# Patient Record
Sex: Female | Born: 1945
Health system: Southern US, Community
[De-identification: ages and names within clinical notes are randomized; demographics above are authoritative.]

## PROBLEM LIST (undated history)

## (undated) DIAGNOSIS — J189 Pneumonia, unspecified organism: Secondary | ICD-10-CM

## (undated) DIAGNOSIS — E785 Hyperlipidemia, unspecified: Secondary | ICD-10-CM

## (undated) DIAGNOSIS — E119 Type 2 diabetes mellitus without complications: Secondary | ICD-10-CM

## (undated) DIAGNOSIS — F329 Major depressive disorder, single episode, unspecified: Secondary | ICD-10-CM

## (undated) DIAGNOSIS — M1990 Unspecified osteoarthritis, unspecified site: Secondary | ICD-10-CM

## (undated) DIAGNOSIS — K5720 Diverticulitis of large intestine with perforation and abscess without bleeding: Secondary | ICD-10-CM

## (undated) DIAGNOSIS — Z87442 Personal history of urinary calculi: Secondary | ICD-10-CM

## (undated) DIAGNOSIS — F411 Generalized anxiety disorder: Secondary | ICD-10-CM

## (undated) DIAGNOSIS — J449 Chronic obstructive pulmonary disease, unspecified: Secondary | ICD-10-CM

## (undated) DIAGNOSIS — F419 Anxiety disorder, unspecified: Secondary | ICD-10-CM

## (undated) DIAGNOSIS — N209 Urinary calculus, unspecified: Secondary | ICD-10-CM

## (undated) DIAGNOSIS — F32A Depression, unspecified: Secondary | ICD-10-CM

## (undated) DIAGNOSIS — E7800 Pure hypercholesterolemia, unspecified: Secondary | ICD-10-CM

## (undated) DIAGNOSIS — I10 Essential (primary) hypertension: Secondary | ICD-10-CM

## (undated) HISTORY — PX: TUBAL LIGATION: SHX77

## (undated) HISTORY — PX: APPENDECTOMY: SHX54

## (undated) HISTORY — DX: Major depressive disorder, single episode, unspecified: F32.9

## (undated) HISTORY — DX: Type 2 diabetes mellitus without complications: E11.9

## (undated) HISTORY — DX: Generalized anxiety disorder: F41.1

## (undated) HISTORY — PX: BACK SURGERY: SHX140

## (undated) HISTORY — DX: Diverticulitis of large intestine with perforation and abscess without bleeding: K57.20

---

## 2007-03-24 ENCOUNTER — Emergency Department (HOSPITAL_COMMUNITY): Admission: EM | Admit: 2007-03-24 | Discharge: 2007-03-24 | Payer: Self-pay | Admitting: Emergency Medicine

## 2007-04-06 ENCOUNTER — Ambulatory Visit: Payer: Self-pay | Admitting: *Deleted

## 2007-04-19 ENCOUNTER — Ambulatory Visit (HOSPITAL_COMMUNITY): Admission: RE | Admit: 2007-04-19 | Discharge: 2007-04-19 | Payer: Self-pay | Admitting: Internal Medicine

## 2007-06-09 ENCOUNTER — Ambulatory Visit: Payer: Self-pay | Admitting: Family Medicine

## 2007-07-08 ENCOUNTER — Ambulatory Visit: Payer: Self-pay | Admitting: Internal Medicine

## 2007-07-08 LAB — CONVERTED CEMR LAB
AST: 15 units/L (ref 0–37)
Alkaline Phosphatase: 55 units/L (ref 39–117)
BUN: 9 mg/dL (ref 6–23)
Creatinine, Ser: 0.62 mg/dL (ref 0.40–1.20)
HDL: 67 mg/dL (ref >39)
LDL Cholesterol: 97 mg/dL (ref 0–99)
Total Bilirubin: 0.9 mg/dL (ref 0.3–1.2)
Total CHOL/HDL Ratio: 3.1

## 2007-08-05 ENCOUNTER — Encounter: Payer: Self-pay | Admitting: Internal Medicine

## 2007-08-05 ENCOUNTER — Ambulatory Visit: Payer: Self-pay | Admitting: Internal Medicine

## 2007-08-17 ENCOUNTER — Ambulatory Visit (HOSPITAL_COMMUNITY): Admission: RE | Admit: 2007-08-17 | Discharge: 2007-08-17 | Payer: Self-pay | Admitting: Family Medicine

## 2007-10-07 ENCOUNTER — Ambulatory Visit: Payer: Self-pay | Admitting: Internal Medicine

## 2007-12-07 ENCOUNTER — Encounter (INDEPENDENT_AMBULATORY_CARE_PROVIDER_SITE_OTHER): Payer: Self-pay | Admitting: Family Medicine

## 2007-12-07 ENCOUNTER — Ambulatory Visit: Payer: Self-pay | Admitting: Internal Medicine

## 2007-12-07 LAB — CONVERTED CEMR LAB
ALT: 22 units/L (ref 0–35)
Alkaline Phosphatase: 64 units/L (ref 39–117)
LDL Cholesterol: 59 mg/dL (ref 0–99)
Sodium: 140 meq/L (ref 135–145)
Total Bilirubin: 0.8 mg/dL (ref 0.3–1.2)
Total CHOL/HDL Ratio: 2.6
Total Protein: 7.7 g/dL (ref 6.0–8.3)
Triglycerides: 196 mg/dL — ABNORMAL HIGH (ref <150)
VLDL: 39 mg/dL (ref 0–40)

## 2008-01-17 ENCOUNTER — Ambulatory Visit: Payer: Self-pay | Admitting: Internal Medicine

## 2008-04-04 ENCOUNTER — Ambulatory Visit: Payer: Self-pay | Admitting: Internal Medicine

## 2008-08-16 ENCOUNTER — Ambulatory Visit (HOSPITAL_COMMUNITY): Admission: RE | Admit: 2008-08-16 | Discharge: 2008-08-16 | Payer: Self-pay | Admitting: Family Medicine

## 2008-08-20 ENCOUNTER — Encounter: Admission: RE | Admit: 2008-08-20 | Discharge: 2008-08-20 | Payer: Self-pay | Admitting: Family Medicine

## 2008-09-19 ENCOUNTER — Ambulatory Visit: Payer: Self-pay | Admitting: Internal Medicine

## 2008-10-31 ENCOUNTER — Ambulatory Visit: Payer: Self-pay | Admitting: Internal Medicine

## 2008-10-31 LAB — CONVERTED CEMR LAB
BUN: 10 mg/dL (ref 6–23)
Calcium: 10.1 mg/dL (ref 8.4–10.5)
Cholesterol: 175 mg/dL (ref 0–200)
Potassium: 4.5 meq/L (ref 3.5–5.3)
Sodium: 139 meq/L (ref 135–145)
Triglycerides: 183 mg/dL — ABNORMAL HIGH (ref <150)
VLDL: 37 mg/dL (ref 0–40)

## 2008-11-07 ENCOUNTER — Ambulatory Visit: Payer: Self-pay | Admitting: Internal Medicine

## 2009-01-12 ENCOUNTER — Emergency Department (HOSPITAL_COMMUNITY): Admission: EM | Admit: 2009-01-12 | Discharge: 2009-01-12 | Payer: Self-pay | Admitting: Emergency Medicine

## 2009-01-14 ENCOUNTER — Ambulatory Visit: Payer: Self-pay | Admitting: Internal Medicine

## 2009-01-30 ENCOUNTER — Ambulatory Visit: Payer: Self-pay | Admitting: Internal Medicine

## 2009-06-06 ENCOUNTER — Ambulatory Visit: Payer: Self-pay | Admitting: Internal Medicine

## 2009-08-22 ENCOUNTER — Ambulatory Visit (HOSPITAL_COMMUNITY): Admission: RE | Admit: 2009-08-22 | Discharge: 2009-08-22 | Payer: Self-pay | Admitting: Internal Medicine

## 2009-08-28 ENCOUNTER — Ambulatory Visit: Payer: Self-pay | Admitting: Internal Medicine

## 2009-09-18 ENCOUNTER — Ambulatory Visit: Payer: Self-pay | Admitting: Internal Medicine

## 2009-09-18 LAB — CONVERTED CEMR LAB
Albumin: 4.5 g/dL (ref 3.5–5.2)
Alkaline Phosphatase: 60 units/L (ref 39–117)
BUN: 11 mg/dL (ref 6–23)
CO2: 24 meq/L (ref 19–32)
Calcium: 9.9 mg/dL (ref 8.4–10.5)
Chloride: 104 meq/L (ref 96–112)
Cholesterol: 139 mg/dL (ref 0–200)
Glucose, Bld: 157 mg/dL — ABNORMAL HIGH (ref 70–99)
HDL: 59 mg/dL (ref >39)
Potassium: 4.2 meq/L (ref 3.5–5.3)
Total Protein: 7.3 g/dL (ref 6.0–8.3)
Triglycerides: 202 mg/dL — ABNORMAL HIGH (ref <150)

## 2009-11-07 ENCOUNTER — Ambulatory Visit: Payer: Self-pay | Admitting: Internal Medicine

## 2009-11-25 ENCOUNTER — Emergency Department (HOSPITAL_COMMUNITY): Admission: EM | Admit: 2009-11-25 | Discharge: 2009-11-25 | Payer: Self-pay | Admitting: Emergency Medicine

## 2010-02-05 ENCOUNTER — Ambulatory Visit: Payer: Self-pay | Admitting: Internal Medicine

## 2010-05-06 ENCOUNTER — Ambulatory Visit: Payer: Self-pay | Admitting: Internal Medicine

## 2010-09-12 ENCOUNTER — Encounter (INDEPENDENT_AMBULATORY_CARE_PROVIDER_SITE_OTHER): Payer: Self-pay | Admitting: Internal Medicine

## 2010-09-12 ENCOUNTER — Ambulatory Visit: Payer: Self-pay | Admitting: Internal Medicine

## 2010-09-12 LAB — CONVERTED CEMR LAB
BUN: 13 mg/dL (ref 6–23)
CO2: 26 meq/L (ref 19–32)
Calcium: 10 mg/dL (ref 8.4–10.5)
Chloride: 101 meq/L (ref 96–112)
Cholesterol: 151 mg/dL (ref 0–200)
Creatinine, Ser: 0.66 mg/dL (ref 0.40–1.20)
Glucose, Bld: 207 mg/dL — ABNORMAL HIGH (ref 70–99)
Total Bilirubin: 0.8 mg/dL (ref 0.3–1.2)
Total CHOL/HDL Ratio: 2.6
Triglycerides: 197 mg/dL — ABNORMAL HIGH (ref <150)
VLDL: 39 mg/dL (ref 0–40)

## 2010-09-29 ENCOUNTER — Ambulatory Visit (HOSPITAL_COMMUNITY): Admission: RE | Admit: 2010-09-29 | Discharge: 2010-09-29 | Payer: Self-pay | Admitting: Internal Medicine

## 2010-10-14 ENCOUNTER — Encounter (INDEPENDENT_AMBULATORY_CARE_PROVIDER_SITE_OTHER): Payer: Self-pay | Admitting: Internal Medicine

## 2010-10-14 LAB — CONVERTED CEMR LAB: Microalb, Ur: 0.5 mg/dL (ref 0.00–1.89)

## 2011-03-05 ENCOUNTER — Emergency Department (HOSPITAL_COMMUNITY): Payer: Medicare Other

## 2011-03-05 ENCOUNTER — Emergency Department (HOSPITAL_COMMUNITY)
Admission: EM | Admit: 2011-03-05 | Discharge: 2011-03-05 | Disposition: A | Discharge status: Home / Self Care | Payer: Medicare Other | Attending: Emergency Medicine | Admitting: Emergency Medicine

## 2011-03-05 DIAGNOSIS — R0682 Tachypnea, not elsewhere classified: Secondary | ICD-10-CM | POA: Insufficient documentation

## 2011-03-05 DIAGNOSIS — I10 Essential (primary) hypertension: Secondary | ICD-10-CM | POA: Insufficient documentation

## 2011-03-05 DIAGNOSIS — E7800 Pure hypercholesterolemia, unspecified: Secondary | ICD-10-CM | POA: Insufficient documentation

## 2011-03-05 DIAGNOSIS — J4489 Other specified chronic obstructive pulmonary disease: Secondary | ICD-10-CM | POA: Insufficient documentation

## 2011-03-05 DIAGNOSIS — R000 Tachycardia, unspecified: Secondary | ICD-10-CM | POA: Insufficient documentation

## 2011-03-05 DIAGNOSIS — R05 Cough: Secondary | ICD-10-CM | POA: Insufficient documentation

## 2011-03-05 DIAGNOSIS — M810 Age-related osteoporosis without current pathological fracture: Secondary | ICD-10-CM | POA: Insufficient documentation

## 2011-03-05 DIAGNOSIS — J449 Chronic obstructive pulmonary disease, unspecified: Secondary | ICD-10-CM | POA: Insufficient documentation

## 2011-03-05 DIAGNOSIS — R059 Cough, unspecified: Secondary | ICD-10-CM | POA: Insufficient documentation

## 2011-03-05 DIAGNOSIS — F411 Generalized anxiety disorder: Secondary | ICD-10-CM | POA: Insufficient documentation

## 2011-03-05 DIAGNOSIS — R079 Chest pain, unspecified: Secondary | ICD-10-CM | POA: Insufficient documentation

## 2011-03-05 DIAGNOSIS — R0602 Shortness of breath: Secondary | ICD-10-CM | POA: Insufficient documentation

## 2011-03-05 DIAGNOSIS — J440 Chronic obstructive pulmonary disease with acute lower respiratory infection: Secondary | ICD-10-CM | POA: Insufficient documentation

## 2011-03-05 DIAGNOSIS — J209 Acute bronchitis, unspecified: Secondary | ICD-10-CM | POA: Insufficient documentation

## 2011-03-07 ENCOUNTER — Emergency Department (HOSPITAL_COMMUNITY): Payer: Medicare Other

## 2011-03-07 ENCOUNTER — Emergency Department (HOSPITAL_COMMUNITY)
Admission: EM | Admit: 2011-03-07 | Discharge: 2011-03-07 | Disposition: A | Discharge status: Home / Self Care | Payer: Medicare Other | Attending: Emergency Medicine | Admitting: Emergency Medicine

## 2011-03-07 DIAGNOSIS — M810 Age-related osteoporosis without current pathological fracture: Secondary | ICD-10-CM | POA: Insufficient documentation

## 2011-03-07 DIAGNOSIS — R059 Cough, unspecified: Secondary | ICD-10-CM | POA: Insufficient documentation

## 2011-03-07 DIAGNOSIS — E7800 Pure hypercholesterolemia, unspecified: Secondary | ICD-10-CM | POA: Insufficient documentation

## 2011-03-07 DIAGNOSIS — R05 Cough: Secondary | ICD-10-CM | POA: Insufficient documentation

## 2011-03-07 DIAGNOSIS — R071 Chest pain on breathing: Secondary | ICD-10-CM | POA: Insufficient documentation

## 2011-03-07 DIAGNOSIS — J438 Other emphysema: Secondary | ICD-10-CM | POA: Insufficient documentation

## 2011-03-07 DIAGNOSIS — I10 Essential (primary) hypertension: Secondary | ICD-10-CM | POA: Insufficient documentation

## 2011-03-17 LAB — URINALYSIS, ROUTINE W REFLEX MICROSCOPIC
Bilirubin Urine: NEGATIVE
Glucose, UA: NEGATIVE mg/dL
Ketones, ur: NEGATIVE mg/dL
Nitrite: NEGATIVE
Specific Gravity, Urine: 1.011 (ref 1.005–1.030)
pH: 5.5 (ref 5.0–8.0)

## 2011-03-17 LAB — CBC
Hemoglobin: 13.6 g/dL (ref 12.0–15.0)
MCHC: 34.8 g/dL (ref 30.0–36.0)
MCV: 92 fL (ref 78.0–100.0)
RBC: 4.26 MIL/uL (ref 3.87–5.11)
WBC: 13.4 10*3/uL — ABNORMAL HIGH (ref 4.0–10.5)

## 2011-03-17 LAB — COMPREHENSIVE METABOLIC PANEL
ALT: 23 U/L (ref 0–35)
AST: 22 U/L (ref 0–37)
CO2: 24 mEq/L (ref 19–32)
Calcium: 8.8 mg/dL (ref 8.4–10.5)
Chloride: 103 mEq/L (ref 96–112)
GFR calc Af Amer: >60 mL/min (ref >60)
GFR calc non Af Amer: >60 mL/min (ref >60)
Glucose, Bld: 131 mg/dL — ABNORMAL HIGH (ref 70–99)
Sodium: 135 mEq/L (ref 135–145)
Total Bilirubin: 1.1 mg/dL (ref 0.3–1.2)

## 2011-03-17 LAB — DIFFERENTIAL
Basophils Absolute: 0 10*3/uL (ref 0.0–0.1)
Basophils Relative: 0 % (ref 0–1)
Eosinophils Absolute: 0.3 10*3/uL (ref 0.0–0.7)
Eosinophils Relative: 2 % (ref 0–5)
Neutrophils Relative %: 82 % — ABNORMAL HIGH (ref 43–77)

## 2011-03-17 LAB — LIPASE, BLOOD: Lipase: 47 U/L (ref 11–59)

## 2011-03-19 ENCOUNTER — Other Ambulatory Visit (HOSPITAL_COMMUNITY): Payer: Self-pay | Admitting: Family Medicine

## 2011-03-19 DIAGNOSIS — R9389 Abnormal findings on diagnostic imaging of other specified body structures: Secondary | ICD-10-CM

## 2011-03-20 ENCOUNTER — Ambulatory Visit (HOSPITAL_COMMUNITY): Payer: Medicare Other

## 2011-03-23 ENCOUNTER — Ambulatory Visit (HOSPITAL_COMMUNITY)
Admission: RE | Admit: 2011-03-23 | Discharge: 2011-03-23 | Disposition: A | Discharge status: Home / Self Care | Payer: Medicare Other | Source: Ambulatory Visit | Attending: Family Medicine | Admitting: Family Medicine

## 2011-03-23 DIAGNOSIS — R9389 Abnormal findings on diagnostic imaging of other specified body structures: Secondary | ICD-10-CM

## 2011-03-23 DIAGNOSIS — R911 Solitary pulmonary nodule: Secondary | ICD-10-CM | POA: Insufficient documentation

## 2011-03-23 DIAGNOSIS — S2239XA Fracture of one rib, unspecified side, initial encounter for closed fracture: Secondary | ICD-10-CM | POA: Insufficient documentation

## 2011-03-23 DIAGNOSIS — J449 Chronic obstructive pulmonary disease, unspecified: Secondary | ICD-10-CM | POA: Insufficient documentation

## 2011-03-23 DIAGNOSIS — J4489 Other specified chronic obstructive pulmonary disease: Secondary | ICD-10-CM | POA: Insufficient documentation

## 2011-03-23 MED ORDER — IOHEXOL 300 MG/ML  SOLN
100.0000 mL | Freq: Once | INTRAMUSCULAR | Status: AC | PRN
  Administered 2011-03-23: 85 mL via INTRAVENOUS

## 2011-06-26 ENCOUNTER — Emergency Department (HOSPITAL_COMMUNITY)
Admission: EM | Admit: 2011-06-26 | Discharge: 2011-06-27 | Disposition: A | Discharge status: Home / Self Care | Payer: Medicare Other | Attending: Emergency Medicine | Admitting: Emergency Medicine

## 2011-06-26 DIAGNOSIS — Z79899 Other long term (current) drug therapy: Secondary | ICD-10-CM | POA: Insufficient documentation

## 2011-06-26 DIAGNOSIS — K089 Disorder of teeth and supporting structures, unspecified: Secondary | ICD-10-CM | POA: Insufficient documentation

## 2011-06-26 DIAGNOSIS — K029 Dental caries, unspecified: Secondary | ICD-10-CM | POA: Insufficient documentation

## 2011-06-26 DIAGNOSIS — M810 Age-related osteoporosis without current pathological fracture: Secondary | ICD-10-CM | POA: Insufficient documentation

## 2011-06-26 DIAGNOSIS — I10 Essential (primary) hypertension: Secondary | ICD-10-CM | POA: Insufficient documentation

## 2011-06-26 DIAGNOSIS — E7800 Pure hypercholesterolemia, unspecified: Secondary | ICD-10-CM | POA: Insufficient documentation

## 2011-06-26 DIAGNOSIS — J438 Other emphysema: Secondary | ICD-10-CM | POA: Insufficient documentation

## 2011-07-21 ENCOUNTER — Encounter: Payer: Self-pay | Admitting: Emergency Medicine

## 2011-07-21 ENCOUNTER — Emergency Department (HOSPITAL_COMMUNITY)
Admission: EM | Admit: 2011-07-21 | Discharge: 2011-07-21 | Disposition: A | Discharge status: Home / Self Care | Payer: Medicare Other | Attending: Emergency Medicine | Admitting: Emergency Medicine

## 2011-07-21 ENCOUNTER — Emergency Department (HOSPITAL_BASED_OUTPATIENT_CLINIC_OR_DEPARTMENT_OTHER)
Admission: EM | Admit: 2011-07-21 | Discharge: 2011-07-22 | Disposition: A | Discharge status: Home / Self Care | Payer: Medicare Other | Attending: Emergency Medicine | Admitting: Emergency Medicine

## 2011-07-21 DIAGNOSIS — I10 Essential (primary) hypertension: Secondary | ICD-10-CM | POA: Insufficient documentation

## 2011-07-21 DIAGNOSIS — E785 Hyperlipidemia, unspecified: Secondary | ICD-10-CM | POA: Insufficient documentation

## 2011-07-21 DIAGNOSIS — J449 Chronic obstructive pulmonary disease, unspecified: Secondary | ICD-10-CM | POA: Insufficient documentation

## 2011-07-21 DIAGNOSIS — J4489 Other specified chronic obstructive pulmonary disease: Secondary | ICD-10-CM | POA: Insufficient documentation

## 2011-07-21 DIAGNOSIS — M25519 Pain in unspecified shoulder: Secondary | ICD-10-CM | POA: Insufficient documentation

## 2011-07-21 DIAGNOSIS — M79609 Pain in unspecified limb: Secondary | ICD-10-CM | POA: Insufficient documentation

## 2011-07-21 DIAGNOSIS — M810 Age-related osteoporosis without current pathological fracture: Secondary | ICD-10-CM | POA: Insufficient documentation

## 2011-07-21 HISTORY — DX: Chronic obstructive pulmonary disease, unspecified: J44.9

## 2011-07-21 HISTORY — DX: Hyperlipidemia, unspecified: E78.5

## 2011-07-21 HISTORY — DX: Essential (primary) hypertension: I10

## 2011-07-21 NOTE — ED Notes (Signed)
Pt c/o right shoulder pain with pain radiating down right arm. Pt reports similar episode 1 month ago with relief of pain after a week. Pt reports pain returned yesterday.

## 2011-07-22 ENCOUNTER — Emergency Department (INDEPENDENT_AMBULATORY_CARE_PROVIDER_SITE_OTHER): Payer: Medicare Other

## 2011-07-22 ENCOUNTER — Encounter (HOSPITAL_BASED_OUTPATIENT_CLINIC_OR_DEPARTMENT_OTHER): Payer: Self-pay | Admitting: Emergency Medicine

## 2011-07-22 ENCOUNTER — Other Ambulatory Visit: Payer: Self-pay

## 2011-07-22 DIAGNOSIS — Z8781 Personal history of (healed) traumatic fracture: Secondary | ICD-10-CM

## 2011-07-22 DIAGNOSIS — M25519 Pain in unspecified shoulder: Secondary | ICD-10-CM

## 2011-07-22 LAB — DIFFERENTIAL
Basophils Absolute: 0.1 10*3/uL (ref 0.0–0.1)
Basophils Relative: 1 % (ref 0–1)
Eosinophils Relative: 4 % (ref 0–5)
Lymphocytes Relative: 32 % (ref 12–46)
Monocytes Absolute: 0.9 10*3/uL (ref 0.1–1.0)
Neutro Abs: 5 10*3/uL (ref 1.7–7.7)

## 2011-07-22 LAB — BASIC METABOLIC PANEL
CO2: 21 mEq/L (ref 19–32)
Calcium: 9.8 mg/dL (ref 8.4–10.5)
Chloride: 103 mEq/L (ref 96–112)
Creatinine, Ser: <0.47 mg/dL — ABNORMAL LOW (ref 0.50–1.10)
Sodium: 137 mEq/L (ref 135–145)

## 2011-07-22 LAB — CBC
MCHC: 34.1 g/dL (ref 30.0–36.0)
Platelets: 244 10*3/uL (ref 150–400)
RDW: 13.3 % (ref 11.5–15.5)
WBC: 9.2 10*3/uL (ref 4.0–10.5)

## 2011-07-22 LAB — CARDIAC PANEL(CRET KIN+CKTOT+MB+TROPI)
CK, MB: 5.4 ng/mL — ABNORMAL HIGH (ref 0.3–4.0)
Relative Index: 2.1 (ref 0.0–2.5)
Total CK: 252 U/L — ABNORMAL HIGH (ref 7–177)
Total CK: 228 U/L — ABNORMAL HIGH (ref 7–177)

## 2011-07-22 MED ORDER — HYDROCODONE-ACETAMINOPHEN 5-500 MG PO TABS: 2.0000 | ORAL_TABLET | Freq: Four times a day (QID) | ORAL | Status: AC | PRN

## 2011-07-22 MED ORDER — KETOROLAC TROMETHAMINE 60 MG/2ML IM SOLN
60.0000 mg | Freq: Once | INTRAMUSCULAR | Status: AC
  Administered 2011-07-22: 60 mg via INTRAMUSCULAR
  Filled 2011-07-22: qty 2

## 2011-07-22 MED ORDER — NAPROXEN 375 MG PO TABS: 375.0000 mg | ORAL_TABLET | Freq: Two times a day (BID) | ORAL | Status: DC

## 2011-07-22 MED ORDER — OXYCODONE-ACETAMINOPHEN 5-325 MG PO TABS
ORAL_TABLET | ORAL | Status: AC
  Administered 2011-07-22: 1
  Filled 2011-07-22: qty 1

## 2011-07-22 NOTE — ED Notes (Signed)
MD at bedside. 

## 2011-07-22 NOTE — ED Provider Notes (Signed)
History     CSN: [id number] Arrival date & time: 07/21/2011 11:06 PM  Chief Complaint  Patient presents with  . Shoulder Pain   The history is provided by the patient and a relative. No language interpreter was used.  Patient denies trauma.  Pain is a 10/10 in the right shoulder, sharp and worse with movement and palpation.  No swelling of the upper extremity.  No rashes on the skin.  No CP, SOB, DOE, n/v/d.  No weakness.  No numbness.  Pain starts in the right shoulder and radiates to the elbow.  No fevers or chills.    Past Medical History  Diagnosis Date  . Hypertension   . Hyperlipemia   . Osteoporosis   . COPD (chronic obstructive pulmonary disease)     Past Surgical History  Procedure Date  . Back surgery   . Appendectomy   . Tubal ligation     History reviewed. No pertinent family history.  History  Substance Use Topics  . Smoking status: Former Games developer  . Smokeless tobacco: Not on file  . Alcohol Use: Yes    OB History    Grav Para Term Preterm Abortions TAB SAB Ect Mult Living                  Review of Systems  Constitutional: Negative for fever, chills, activity change, appetite change and fatigue.  HENT: Negative for facial swelling, neck pain and neck stiffness.   Eyes: Negative for discharge.  Respiratory: Negative for apnea.   Cardiovascular: Negative for chest pain.  Gastrointestinal: Negative for abdominal distention.  Genitourinary: Negative for difficulty urinating.  Musculoskeletal: Positive for arthralgias. Negative for back pain, joint swelling and gait problem.  Neurological: Negative for dizziness.  Hematological: Negative for adenopathy. Does not bruise/bleed easily.  Psychiatric/Behavioral: Negative for agitation.    Physical Exam  BP 187/73  Temp(Src) 98 F (36.7 C) (Oral)  Resp 18  SpO2 98%  Physical Exam  Constitutional: She is oriented to person, place, and time. She appears well-developed and well-nourished. No distress.    HENT:  Head: Normocephalic and atraumatic.  Eyes: EOM are normal. Pupils are equal, round, and reactive to light.  Neck: Neck supple. No JVD present.  Cardiovascular: Normal rate, regular rhythm and intact distal pulses.   Pulmonary/Chest: Effort normal and breath sounds normal. No respiratory distress.  Abdominal: Soft. Bowel sounds are normal. She exhibits no distension.  Musculoskeletal: She exhibits no edema.       Right shoulder: She exhibits pain. She exhibits normal range of motion, no bony tenderness, no swelling, no effusion, no crepitus, no deformity, no laceration, no spasm, normal pulse and normal strength.       5/5 strength of the RUE, no decreased sensation. Intact bicep, tricep reflex.  Negative Neeers tests of the right shoulder, no snuff box tenderness of the right wrist.  No effusion of the elbow.  No warmth erythema or induration of the skin.  No winging of the right scapula  Lymphadenopathy:    She has no cervical adenopathy.  Neurological: She is alert and oriented to person, place, and time. She displays normal reflexes.  Skin: Skin is warm and dry. No rash noted. No erythema. No pallor.  Psychiatric: She has a normal mood and affect.    ED Course  Procedures  MDM  Date: 07/22/2011  Rate:71  Rhythm: normal sinus rhythm  QRS Axis: normal  Intervals: normal  ST/T Wave abnormalities: normal  Conduction Disutrbances:none  Narrative  Interpretation: normal  Old EKG Reviewed: none available   Follow up with your family doctor for nerve conduction studies.  Return for CP, SOB, DOE or any concerns     Marinda Tyer K Farron Watrous-Rasch, MD 07/22/11 [phone]

## 2011-07-22 NOTE — ED Notes (Signed)
Pt complaining about waiting to see MD. Apologized for wait. Pt states she was told at [facility] she would not have to wait to be seen here. I apologized that she was given that information but informed pt that there is a wait to see MD.

## 2011-09-08 ENCOUNTER — Other Ambulatory Visit (HOSPITAL_COMMUNITY): Payer: Self-pay | Admitting: Family Medicine

## 2011-09-08 DIAGNOSIS — Z1231 Encounter for screening mammogram for malignant neoplasm of breast: Secondary | ICD-10-CM

## 2011-10-05 ENCOUNTER — Ambulatory Visit (HOSPITAL_COMMUNITY)
Admission: RE | Admit: 2011-10-05 | Discharge: 2011-10-05 | Disposition: A | Discharge status: Home / Self Care | Payer: Medicare Other | Source: Ambulatory Visit | Attending: Family Medicine | Admitting: Family Medicine

## 2011-10-05 DIAGNOSIS — Z1231 Encounter for screening mammogram for malignant neoplasm of breast: Secondary | ICD-10-CM | POA: Insufficient documentation

## 2011-10-06 ENCOUNTER — Other Ambulatory Visit: Payer: Self-pay | Admitting: Family Medicine

## 2011-10-06 DIAGNOSIS — R928 Other abnormal and inconclusive findings on diagnostic imaging of breast: Secondary | ICD-10-CM

## 2011-10-29 ENCOUNTER — Other Ambulatory Visit (HOSPITAL_COMMUNITY): Payer: Self-pay | Admitting: Family Medicine

## 2011-10-29 DIAGNOSIS — R911 Solitary pulmonary nodule: Secondary | ICD-10-CM

## 2011-11-02 ENCOUNTER — Ambulatory Visit
Admission: RE | Admit: 2011-11-02 | Discharge: 2011-11-02 | Disposition: A | Discharge status: Home / Self Care | Payer: Medicare Other | Source: Ambulatory Visit | Attending: Family Medicine | Admitting: Family Medicine

## 2011-11-02 DIAGNOSIS — R928 Other abnormal and inconclusive findings on diagnostic imaging of breast: Secondary | ICD-10-CM

## 2011-11-03 ENCOUNTER — Ambulatory Visit (HOSPITAL_COMMUNITY)
Admission: RE | Admit: 2011-11-03 | Discharge: 2011-11-03 | Disposition: A | Discharge status: Home / Self Care | Payer: Medicare Other | Source: Ambulatory Visit | Attending: Family Medicine | Admitting: Family Medicine

## 2011-11-03 DIAGNOSIS — R911 Solitary pulmonary nodule: Secondary | ICD-10-CM

## 2011-11-03 DIAGNOSIS — N200 Calculus of kidney: Secondary | ICD-10-CM | POA: Insufficient documentation

## 2011-11-03 DIAGNOSIS — Z09 Encounter for follow-up examination after completed treatment for conditions other than malignant neoplasm: Secondary | ICD-10-CM | POA: Insufficient documentation

## 2011-11-03 DIAGNOSIS — J984 Other disorders of lung: Secondary | ICD-10-CM | POA: Insufficient documentation

## 2011-11-03 DIAGNOSIS — J438 Other emphysema: Secondary | ICD-10-CM | POA: Insufficient documentation

## 2011-11-03 DIAGNOSIS — I2510 Atherosclerotic heart disease of native coronary artery without angina pectoris: Secondary | ICD-10-CM | POA: Insufficient documentation

## 2011-11-03 MED ORDER — IOHEXOL 300 MG/ML  SOLN
80.0000 mL | Freq: Once | INTRAMUSCULAR | Status: AC | PRN
  Administered 2011-11-03: 80 mL via INTRAVENOUS

## 2011-11-09 ENCOUNTER — Ambulatory Visit (HOSPITAL_COMMUNITY)
Admission: RE | Admit: 2011-11-09 | Discharge: 2011-11-09 | Disposition: A | Discharge status: Home / Self Care | Payer: Medicare Other | Source: Ambulatory Visit | Attending: Family Medicine | Admitting: Family Medicine

## 2011-11-09 DIAGNOSIS — Z13820 Encounter for screening for osteoporosis: Secondary | ICD-10-CM | POA: Insufficient documentation

## 2011-11-09 DIAGNOSIS — Z780 Asymptomatic menopausal state: Secondary | ICD-10-CM | POA: Insufficient documentation

## 2012-01-25 ENCOUNTER — Emergency Department (HOSPITAL_COMMUNITY)
Admission: EM | Admit: 2012-01-25 | Discharge: 2012-01-26 | Disposition: A | Discharge status: Home / Self Care | Payer: Medicare Other | Attending: Emergency Medicine | Admitting: Emergency Medicine

## 2012-01-25 ENCOUNTER — Encounter (HOSPITAL_COMMUNITY): Payer: Self-pay | Admitting: *Deleted

## 2012-01-25 DIAGNOSIS — E785 Hyperlipidemia, unspecified: Secondary | ICD-10-CM | POA: Insufficient documentation

## 2012-01-25 DIAGNOSIS — I10 Essential (primary) hypertension: Secondary | ICD-10-CM | POA: Insufficient documentation

## 2012-01-25 DIAGNOSIS — E7800 Pure hypercholesterolemia, unspecified: Secondary | ICD-10-CM | POA: Insufficient documentation

## 2012-01-25 DIAGNOSIS — Z79899 Other long term (current) drug therapy: Secondary | ICD-10-CM | POA: Insufficient documentation

## 2012-01-25 DIAGNOSIS — J441 Chronic obstructive pulmonary disease with (acute) exacerbation: Secondary | ICD-10-CM | POA: Insufficient documentation

## 2012-01-25 DIAGNOSIS — E119 Type 2 diabetes mellitus without complications: Secondary | ICD-10-CM | POA: Insufficient documentation

## 2012-01-25 HISTORY — DX: Pure hypercholesterolemia, unspecified: E78.00

## 2012-01-25 NOTE — ED Notes (Addendum)
States: "i have bronchitis, i get it every year". C/o cough. Onset last week, worse on Friday. States, "it was getting better, now it is worse". Also reports: weak, tired, sob, rib pain from coughing. Tried codeine cough med from last year (it helped, but finished med). Productive green sputum. Has been using nebulizer. Pt calm, NAD, alert, interactive, speaking in clear long phrases, easily provoked cough, guarding resps and speech d/t triggering cough. H/o COPD. Low grade fever noted.

## 2012-01-26 ENCOUNTER — Emergency Department (HOSPITAL_COMMUNITY): Payer: Medicare Other

## 2012-01-26 LAB — DIFFERENTIAL
Basophils Absolute: 0 10*3/uL (ref 0.0–0.1)
Basophils Relative: 0 % (ref 0–1)
Eosinophils Relative: 1 % (ref 0–5)
Monocytes Absolute: 1.4 10*3/uL — ABNORMAL HIGH (ref 0.1–1.0)

## 2012-01-26 LAB — POCT I-STAT, CHEM 8
Calcium, Ion: 1.21 mmol/L (ref 1.12–1.32)
HCT: 41 % (ref 36.0–46.0)
Hemoglobin: 13.9 g/dL (ref 12.0–15.0)
TCO2: 28 mmol/L (ref 0–100)

## 2012-01-26 LAB — CBC
HCT: 38.4 % (ref 36.0–46.0)
MCHC: 34.1 g/dL (ref 30.0–36.0)
MCV: 91.6 fL (ref 78.0–100.0)
RDW: 13.1 % (ref 11.5–15.5)

## 2012-01-26 MED ORDER — AZITHROMYCIN 250 MG PO TABS
500.0000 mg | ORAL_TABLET | Freq: Once | ORAL | Status: AC
  Administered 2012-01-26: 500 mg via ORAL
  Filled 2012-01-26: qty 2

## 2012-01-26 MED ORDER — PREDNISONE 10 MG PO TABS: 20.0000 mg | ORAL_TABLET | Freq: Every day | ORAL | Status: DC

## 2012-01-26 MED ORDER — AZITHROMYCIN 250 MG PO TABS: 250.0000 mg | ORAL_TABLET | Freq: Every day | ORAL | Status: AC

## 2012-01-26 MED ORDER — ALBUTEROL SULFATE (5 MG/ML) 0.5% IN NEBU: 5.0000 mg | INHALATION_SOLUTION | Freq: Once | RESPIRATORY_TRACT | Status: DC

## 2012-01-26 MED ORDER — HYDROCOD POLST-CHLORPHEN POLST 10-8 MG/5ML PO LQCR: 5.0000 mL | Freq: Two times a day (BID) | ORAL | Status: DC

## 2012-01-26 MED ORDER — PREDNISONE 20 MG PO TABS
20.0000 mg | ORAL_TABLET | Freq: Once | ORAL | Status: AC
  Administered 2012-01-26: 20 mg via ORAL
  Filled 2012-01-26: qty 1

## 2012-01-26 NOTE — ED Provider Notes (Signed)
History     CSN: [id number]  Arrival date & time 01/25/12  2315   First MD Initiated Contact with Patient 01/26/12 0151      Chief Complaint  Patient presents with  . Cough    (Consider location/radiation/quality/duration/timing/severity/associated sxs/prior treatment) HPI Comments: Patient with COPD and recurrent bronchitis, now with coughing for the last 2 weeks precipitated with exertion or talking.  She has been using her nebulizer or inhaler with some relief.  She states she had some leftover codeine cough syrup from last year, which did help, but she is out of the  Patient is a 66 y.o. female presenting with cough. The history is provided by the patient.  Cough This is a recurrent problem. The current episode started more than 1 week ago. The problem occurs every few minutes. The problem has not changed since onset.There has been no fever. Pertinent negatives include no chills, no rhinorrhea, no shortness of breath and no wheezing. She has tried cough syrup for the symptoms. She is not a smoker. Her past medical history is significant for bronchitis and COPD.    Past Medical History  Diagnosis Date  . Hypertension   . Hyperlipemia   . Osteoporosis   . COPD (chronic obstructive pulmonary disease)   . Diabetes mellitus   . Hypercholesterolemia     Past Surgical History  Procedure Date  . Back surgery   . Appendectomy   . Tubal ligation     No family history on file.  History  Substance Use Topics  . Smoking status: Former Games developer  . Smokeless tobacco: Not on file  . Alcohol Use: Yes     occaisionally    OB History    Grav Para Term Preterm Abortions TAB SAB Ect Mult Living                  Review of Systems  Constitutional: Negative for chills.  HENT: Negative for rhinorrhea.   Respiratory: Positive for cough. Negative for choking, shortness of breath and wheezing.   Genitourinary: Negative for dysuria.  Neurological: Negative for dizziness.     Allergies  Review of patient's allergies indicates no known allergies.  Home Medications   Current Outpatient Rx  Name Route Sig Dispense Refill  . ALBUTEROL SULFATE HFA 108 (90 BASE) MCG/ACT IN AERS Inhalation Inhale 2 puffs into the lungs every 6 (six) hours as needed. For wheeze or shortness of breath    . ALENDRONATE SODIUM 70 MG PO TABS Oral Take 70 mg by mouth every 7 (seven) days. Take with a full glass of water on an empty stomach.    . ASPIRIN 325 MG PO TABS Oral Take 325 mg by mouth daily.    Marland Kitchen CALCIUM CARBONATE 1250 MG PO TABS Oral Take 1 tablet by mouth daily.    Marland Kitchen FLUTICASONE-SALMETEROL 250-50 MCG/DOSE IN AEPB Inhalation Inhale 1 puff into the lungs every 12 (twelve) hours.    Marland Kitchen METFORMIN HCL 500 MG PO TABS Oral Take 1,000 mg by mouth 2 (two) times daily with a meal.    . ADULT MULTIVITAMIN W/MINERALS CH Oral Take 1 tablet by mouth daily.    Marland Kitchen VALSARTAN 160 MG PO TABS Oral Take 160 mg by mouth daily.    . AZITHROMYCIN 250 MG PO TABS Oral Take 1 tablet (250 mg total) by mouth daily. 4 tablet 0  . HYDROCOD POLST-CPM POLST ER 10-8 MG/5ML PO LQCR Oral Take 5 mLs by mouth every 12 (twelve) hours. 140 mL 0  .  PREDNISONE 10 MG PO TABS Oral Take 2 tablets (20 mg total) by mouth daily. 15 tablet 0    BP 136/77  Pulse 128  Temp(Src) 99.5 F (37.5 C) (Oral)  Resp 18  SpO2 92%  Physical Exam  Constitutional: She is oriented to person, place, and time. She appears well-developed and well-nourished.  HENT:  Head: Normocephalic.  Eyes: Pupils are equal, round, and reactive to light.  Neck: Normal range of motion.  Cardiovascular: Tachycardia present.        After coughing  Pulmonary/Chest: No respiratory distress. She has wheezes. She exhibits no tenderness.       Frequent coughing episodes, nonproductive  Abdominal: Soft.  Musculoskeletal: Normal range of motion.  Neurological: She is alert and oriented to person, place, and time.  Skin: Skin is warm. No rash noted.     ED Course  Procedures (including critical care time)  Labs Reviewed  CBC - Abnormal; Notable for the following:    WBC 14.2 (*)    All other components within normal limits  DIFFERENTIAL - Abnormal; Notable for the following:    Neutrophils Relative 78 (*)    Neutro Abs 11.1 (*)    Lymphocytes Relative 11 (*)    Monocytes Absolute 1.4 (*)    All other components within normal limits  POCT I-STAT, CHEM 8 - Abnormal; Notable for the following:    Glucose, Bld 213 (*)    All other components within normal limits   Dg Chest 2 View  01/26/2012  *RADIOLOGY REPORT*  Clinical Data: Cough.  CHEST - 2 VIEW  Comparison: Chest radiograph performed 07/22/2011, and CT of the chest performed 11/03/2011  Findings: The lungs are hyperexpanded, with flattening of the hemidiaphragms, compatible with known COPD.  Minimal left basilar scarring is seen.  There is no evidence of focal opacification, pleural effusion or pneumothorax.  The heart is normal in size; calcification is noted within the aortic arch.  No acute osseous abnormalities are seen.  A healed right seventh rib fracture is again noted.  IMPRESSION: Findings of COPD; no superimposed airspace consolidation seen.  Original Report Authenticated By: Tonia Ghent, M.D.     1. COPD exacerbation       MDM  COPD/bronchitis, exacerbation.  We'll prescribe azithromycin steroids, increased use of her albuterol inhaler or nebulizer and Tussionex        Arman Filter, NP 01/26/12 1610  Arman Filter, NP 01/26/12 [id number]

## 2012-01-26 NOTE — Discharge Instructions (Signed)
Chronic Bronchitis and Emphysema Exacerbation You have chronic obstructive lung disease which has gotten worse. This can be an increase in your cough, wheezing, or shortness of breath. These problems are often worse during periods of air pollution or if you are exposed to smoke. HOME CARE INSTRUCTIONS   You should avoid all substances which irritate the airway, especially tobacco smoke.   If you are a smoker, consider using nicotine gum or patches to quit. Quitting smoking is very important to prevent worsening of this disease.   Increasing oral fluids and using a cool air vaporizer can help thin bronchial secretions. This makes it easier to clear your chest when you cough.   If you have a home nebulizer and oxygen, continue to use them as directed.  The following medications may be prescribed to help you depending on what your caregiver finds today.   Antibiotics.   Bronchodilators (inhaled or tablets).   Cortisone medicines (inhaled or tablets).   It is important to use good technique with inhaled medications, and spacer devices may be needed to help improve drug delivery. Chronic lung disease is frequently severe and hospital care may be needed. Be sure to follow-up as recommended with your primary caregiver.   Take all medications as directed.  SEEK IMMEDIATE MEDICAL CARE IF:  You develop extreme shortness of breath.   You have severe chest pain or blood in your sputum.   You develop a high fever, weakness, repeated vomiting, or fainting.   You develop confusion.  MAKE SURE YOU:   Understand these instructions.   Will watch your condition.   Will get help right away if you are not doing well or get worse.  Document Released: 09/13/2007 Document Revised: 06/01/2011 Document Reviewed: 09/13/2007 Exit[employer] Patient Information 2012 [employer]. 

## 2012-01-26 NOTE — ED Notes (Signed)
Pt presents to department for evaluation of cough and SOB. Ongoing x1 week. Pt also states she feels very weak and "run down." states she has been using nebulizer at home without relief. Speaking complete sentences, respirations unlabored. She is alert and oriented x4. No signs of distress noted at present.

## 2012-01-27 NOTE — ED Provider Notes (Signed)
Medical screening examination/treatment/procedure(s) were performed by non-physician practitioner and as supervising physician I was immediately available for consultation/collaboration.   Bryden Darden A. Patrica Duel, MD 01/27/12 [phone]

## 2012-09-05 ENCOUNTER — Emergency Department (HOSPITAL_BASED_OUTPATIENT_CLINIC_OR_DEPARTMENT_OTHER): Payer: Medicare Other

## 2012-09-05 ENCOUNTER — Emergency Department (HOSPITAL_BASED_OUTPATIENT_CLINIC_OR_DEPARTMENT_OTHER)
Admission: EM | Admit: 2012-09-05 | Discharge: 2012-09-05 | Disposition: A | Discharge status: Home / Self Care | Payer: Medicare Other | Attending: Emergency Medicine | Admitting: Emergency Medicine

## 2012-09-05 ENCOUNTER — Encounter (HOSPITAL_BASED_OUTPATIENT_CLINIC_OR_DEPARTMENT_OTHER): Payer: Self-pay

## 2012-09-05 DIAGNOSIS — Z79899 Other long term (current) drug therapy: Secondary | ICD-10-CM | POA: Insufficient documentation

## 2012-09-05 DIAGNOSIS — J449 Chronic obstructive pulmonary disease, unspecified: Secondary | ICD-10-CM | POA: Insufficient documentation

## 2012-09-05 DIAGNOSIS — I10 Essential (primary) hypertension: Secondary | ICD-10-CM | POA: Insufficient documentation

## 2012-09-05 DIAGNOSIS — T50905A Adverse effect of unspecified drugs, medicaments and biological substances, initial encounter: Secondary | ICD-10-CM

## 2012-09-05 DIAGNOSIS — T887XXA Unspecified adverse effect of drug or medicament, initial encounter: Secondary | ICD-10-CM | POA: Insufficient documentation

## 2012-09-05 DIAGNOSIS — E119 Type 2 diabetes mellitus without complications: Secondary | ICD-10-CM | POA: Insufficient documentation

## 2012-09-05 DIAGNOSIS — Z9089 Acquired absence of other organs: Secondary | ICD-10-CM | POA: Insufficient documentation

## 2012-09-05 DIAGNOSIS — T50995A Adverse effect of other drugs, medicaments and biological substances, initial encounter: Secondary | ICD-10-CM | POA: Insufficient documentation

## 2012-09-05 DIAGNOSIS — J4489 Other specified chronic obstructive pulmonary disease: Secondary | ICD-10-CM | POA: Insufficient documentation

## 2012-09-05 LAB — CBC WITH DIFFERENTIAL/PLATELET
Basophils Absolute: 0 10*3/uL (ref 0.0–0.1)
Eosinophils Relative: 3 % (ref 0–5)
Lymphocytes Relative: 30 % (ref 12–46)
Neutro Abs: 3.9 10*3/uL (ref 1.7–7.7)
Platelets: 259 10*3/uL (ref 150–400)
RDW: 14 % (ref 11.5–15.5)
WBC: 7.3 10*3/uL (ref 4.0–10.5)

## 2012-09-05 LAB — BASIC METABOLIC PANEL
CO2: 23 mEq/L (ref 19–32)
Calcium: 10 mg/dL (ref 8.4–10.5)
Chloride: 103 mEq/L (ref 96–112)
Sodium: 137 mEq/L (ref 135–145)

## 2012-09-05 LAB — TROPONIN I
Troponin I: <0.3 ng/mL (ref <0.30)
Troponin I: <0.3 ng/mL (ref <0.30)

## 2012-09-05 MED ORDER — GI COCKTAIL ~~LOC~~
30.0000 mL | Freq: Once | ORAL | Status: AC
  Administered 2012-09-05: 30 mL via ORAL
  Filled 2012-09-05: qty 30

## 2012-09-05 NOTE — ED Notes (Signed)
Waiting for ride home  

## 2012-09-05 NOTE — ED Notes (Signed)
Returned from xray

## 2012-09-05 NOTE — ED Notes (Signed)
Family arrived to transport pt home

## 2012-09-05 NOTE — ED Notes (Signed)
Transported to xray 

## 2012-09-05 NOTE — ED Notes (Signed)
Pt. States she is feeling better after 02 placed. Sinus on tele

## 2012-09-05 NOTE — ED Notes (Signed)
Patient reports that she was awakened this am with chest tightness and pressure 0100, EMS was called to the home and EKG was done and she refused transport

## 2012-09-05 NOTE — ED Provider Notes (Signed)
History     CSN: [id number]  Arrival date & time 09/05/12  0213   First MD Initiated Contact with Patient 09/05/12 0231      Chief Complaint  Patient presents with  . Chest Pain    (Consider location/radiation/quality/duration/timing/severity/associated sxs/prior treatment) Patient is a 66 y.o. female presenting with chest pain. The history is provided by the patient.  Chest Pain The chest pain began 3 - 5 hours ago. Chest pain occurs constantly. The chest pain is unchanged. Associated with: taking melatonin. The quality of the pain is described as dull. The pain does not radiate. Pertinent negatives for primary symptoms include no shortness of breath, no palpitations and no nausea.  Pertinent negatives for associated symptoms include no diaphoresis. She tried nothing for the symptoms. Risk factors include smoking/tobacco exposure.  Pertinent negatives for past medical history include no MI.  Procedure history is negative for cardiac catheterization.   Took melatonin and then had dry mouth feeling she couls not swallow and then began feeling upset and then developed chest tightness.  No DOE, no SOB no n/v/d.    Past Medical History  Diagnosis Date  . Hypertension   . Hyperlipemia   . Osteoporosis   . COPD (chronic obstructive pulmonary disease)   . Diabetes mellitus   . Hypercholesterolemia     Past Surgical History  Procedure Date  . Back surgery   . Appendectomy   . Tubal ligation     No family history on file.  History  Substance Use Topics  . Smoking status: Former Games developer  . Smokeless tobacco: Not on file  . Alcohol Use: Yes     occaisionally    OB History    Grav Para Term Preterm Abortions TAB SAB Ect Mult Living                  Review of Systems  Constitutional: Negative for chills and diaphoresis.  Respiratory: Negative for shortness of breath.   Cardiovascular: Negative for chest pain and palpitations.  Gastrointestinal: Negative for nausea.    All other systems reviewed and are negative.    Allergies  Review of patient's allergies indicates no known allergies.  Home Medications   Current Outpatient Rx  Name Route Sig Dispense Refill  . ALBUTEROL SULFATE HFA 108 (90 BASE) MCG/ACT IN AERS Inhalation Inhale 2 puffs into the lungs every 6 (six) hours as needed. For wheeze or shortness of breath    . ALENDRONATE SODIUM 70 MG PO TABS Oral Take 70 mg by mouth every 7 (seven) days. Take with a full glass of water on an empty stomach.    . ASPIRIN 325 MG PO TABS Oral Take 325 mg by mouth daily.    Marland Kitchen CALCIUM CARBONATE 1250 MG PO TABS Oral Take 1 tablet by mouth daily.    Marland Kitchen HYDROCOD POLST-CPM POLST ER 10-8 MG/5ML PO LQCR Oral Take 5 mLs by mouth every 12 (twelve) hours. 140 mL 0  . FLUTICASONE-SALMETEROL 250-50 MCG/DOSE IN AEPB Inhalation Inhale 1 puff into the lungs every 12 (twelve) hours.    Marland Kitchen METFORMIN HCL 500 MG PO TABS Oral Take 1,000 mg by mouth 2 (two) times daily with a meal.    . ADULT MULTIVITAMIN W/MINERALS CH Oral Take 1 tablet by mouth daily.    Marland Kitchen VALSARTAN 160 MG PO TABS Oral Take 160 mg by mouth daily.      BP 164/80  Pulse 68  Temp 97.7 F (36.5 C) (Oral)  Resp 20  SpO2 100%  Physical Exam  Constitutional: She is oriented to person, place, and time. She appears well-developed and well-nourished. No distress.  HENT:  Head: Normocephalic and atraumatic.  Mouth/Throat: Oropharynx is clear and moist.  Eyes: Pupils are equal, round, and reactive to light.  Neck: Normal range of motion. Neck supple.  Cardiovascular: Normal rate and regular rhythm.   Pulmonary/Chest: Effort normal and breath sounds normal. She has no wheezes. She has no rales.  Abdominal: Soft. Bowel sounds are normal. There is no tenderness. There is no rebound and no guarding.  Musculoskeletal: Normal range of motion.  Neurological: She is alert and oriented to person, place, and time.  Skin: Skin is warm and dry.  Psychiatric: Her mood appears  anxious.    ED Course  Procedures (including critical care time)  Labs Reviewed  CBC WITH DIFFERENTIAL - Abnormal; Notable for the following:    RBC 3.50 (*)     Hemoglobin 10.6 (*)     HCT 31.6 (*)     All other components within normal limits  BASIC METABOLIC PANEL - Abnormal; Notable for the following:    Glucose, Bld 138 (*)     All other components within normal limits  TROPONIN I  TROPONIN I   Dg Chest 2 View  09/05/2012  *RADIOLOGY REPORT*  Clinical Data: 66 year old female chest tightness, left arm tightness, chest pain.  CHEST - 2 VIEW  Comparison: 01/26/2012 and earlier.  Findings: Chronic large lung volumes. Visualized tracheal air column is within normal limits.  No pneumothorax or pulmonary edema.  No pleural effusion or consolidation. Stable apical scarring.  Asymmetric density at the right lung base felt to represent summation shadow/artifact.  No acute pulmonary opacity.  Stable cardiac size and mediastinal contours. No acute osseous abnormality identified.  Chronic right lateral seventh rib fracture. Aortic calcified atherosclerosis.  IMPRESSION: 1. No acute cardiopulmonary abnormality. 2.  Summation artifact suspected at the right lower lung on the frontal view.  Attention directed on follow-up.   Original Report Authenticated By: Harley Hallmark, M.D.      No diagnosis found.    MDM   Date: 09/05/2012  Rate: 64  Rhythm: normal sinus rhythm  QRS Axis: normal  Intervals: normal  ST/T Wave abnormalities: normal  Conduction Disutrbances: none  Narrative Interpretation: unremarkable    Symptoms consistent with adverse drug reaction with superimposed panic attack.  In the setting of symptoms of <8 hours one normal EKG and 2 negative troponins is sufficient to exclude ACS.  Given patient's age will refer to cardiology for outpatient stress test.  Return immediately for chest pain shortness of breath nausea sweatiness or any concerns.          Jasmine Awe, MD 09/05/12 [phone]

## 2012-09-05 NOTE — ED Notes (Signed)
MD at bedside. 

## 2012-09-16 ENCOUNTER — Emergency Department (HOSPITAL_COMMUNITY)
Admission: EM | Admit: 2012-09-16 | Discharge: 2012-09-16 | Disposition: A | Discharge status: Home / Self Care | Payer: Medicare Other | Source: Home / Self Care | Attending: Family Medicine | Admitting: Family Medicine

## 2012-09-16 ENCOUNTER — Encounter (HOSPITAL_COMMUNITY): Payer: Self-pay | Admitting: *Deleted

## 2012-09-16 DIAGNOSIS — I10 Essential (primary) hypertension: Secondary | ICD-10-CM

## 2012-09-16 MED ORDER — VALSARTAN 160 MG PO TABS: 160.0000 mg | ORAL_TABLET | Freq: Every day | ORAL | Status: DC

## 2012-09-16 NOTE — ED Notes (Signed)
Pt  Needs  A  Refill     Of  Her  diovan        - she  Is a  [employer]  [facility]  Pt

## 2012-09-16 NOTE — ED Provider Notes (Signed)
History     CSN: [id number]  Arrival date & time 09/16/12  0930   First MD Initiated Contact with Patient 09/16/12 1032      Chief Complaint  Patient presents with  . Medication Refill    (Consider location/radiation/quality/duration/timing/severity/associated sxs/prior treatment) Patient is a 66 y.o. female presenting with hypertension. The history is provided by the patient.  Hypertension This is a chronic problem. The current episode started more than 1 week ago (for sev years, recently had increase in med dose.). Pertinent negatives include no chest pain, no headaches and no shortness of breath.    Past Medical History  Diagnosis Date  . Hypertension   . Hyperlipemia   . Osteoporosis   . COPD (chronic obstructive pulmonary disease)   . Diabetes mellitus   . Hypercholesterolemia     Past Surgical History  Procedure Date  . Back surgery   . Appendectomy   . Tubal ligation     No family history on file.  History  Substance Use Topics  . Smoking status: Former Games developer  . Smokeless tobacco: Not on file  . Alcohol Use: Yes     occaisionally    OB History    Grav Para Term Preterm Abortions TAB SAB Ect Mult Living                  Review of Systems  Constitutional: Negative.   Respiratory: Negative for shortness of breath.   Cardiovascular: Negative for chest pain.  Neurological: Negative for headaches.    Allergies  Review of patient's allergies indicates no known allergies.  Home Medications   Current Outpatient Rx  Name Route Sig Dispense Refill  . VALSARTAN 160 MG PO TABS Oral Take 160 mg by mouth daily.    . ALBUTEROL SULFATE HFA 108 (90 BASE) MCG/ACT IN AERS Inhalation Inhale 2 puffs into the lungs every 6 (six) hours as needed. For wheeze or shortness of breath    . ALENDRONATE SODIUM 70 MG PO TABS Oral Take 70 mg by mouth every 7 (seven) days. Take with a full glass of water on an empty stomach.    . ASPIRIN 325 MG PO TABS Oral Take 325 mg by  mouth daily.    Marland Kitchen CALCIUM CARBONATE 1250 MG PO TABS Oral Take 1 tablet by mouth daily.    Marland Kitchen HYDROCOD POLST-CPM POLST ER 10-8 MG/5ML PO LQCR Oral Take 5 mLs by mouth every 12 (twelve) hours. 140 mL 0  . FLUTICASONE-SALMETEROL 250-50 MCG/DOSE IN AEPB Inhalation Inhale 1 puff into the lungs every 12 (twelve) hours.    Marland Kitchen METFORMIN HCL 500 MG PO TABS Oral Take 1,000 mg by mouth 2 (two) times daily with a meal.    . ADULT MULTIVITAMIN W/MINERALS CH Oral Take 1 tablet by mouth daily.    Marland Kitchen VALSARTAN 160 MG PO TABS Oral Take 1 tablet (160 mg total) by mouth daily. 30 tablet 1    BP 133/65  Pulse 74  Temp 98.6 F (37 C) (Oral)  Resp 16  SpO2 97%  Physical Exam  Nursing note and vitals reviewed. Constitutional: She is oriented to person, place, and time. She appears well-developed and well-nourished.  Eyes: Pupils are equal, round, and reactive to light.  Neck: Normal range of motion. Neck supple.  Cardiovascular: Normal rate, regular rhythm, normal heart sounds and intact distal pulses.   Pulmonary/Chest: Effort normal and breath sounds normal.  Musculoskeletal: She exhibits no edema.  Neurological: She is alert and oriented to person,  place, and time.  Skin: Skin is warm and dry.    ED Course  Procedures (including critical care time)  Labs Reviewed - No data to display No results found.   1. Hypertension, benign       MDM          Linna Hoff, MD 09/16/12 1055

## 2012-11-02 ENCOUNTER — Other Ambulatory Visit (HOSPITAL_COMMUNITY): Payer: Self-pay | Admitting: Family Medicine

## 2012-11-02 DIAGNOSIS — Z1231 Encounter for screening mammogram for malignant neoplasm of breast: Secondary | ICD-10-CM

## 2012-11-28 ENCOUNTER — Ambulatory Visit (HOSPITAL_COMMUNITY)
Admission: RE | Admit: 2012-11-28 | Discharge: 2012-11-28 | Disposition: A | Discharge status: Home / Self Care | Payer: Medicare Other | Source: Ambulatory Visit | Attending: Family Medicine | Admitting: Family Medicine

## 2012-11-28 DIAGNOSIS — Z1231 Encounter for screening mammogram for malignant neoplasm of breast: Secondary | ICD-10-CM | POA: Insufficient documentation

## 2013-11-01 ENCOUNTER — Other Ambulatory Visit (HOSPITAL_COMMUNITY): Payer: Self-pay | Admitting: Family Medicine

## 2013-11-01 DIAGNOSIS — Z1231 Encounter for screening mammogram for malignant neoplasm of breast: Secondary | ICD-10-CM

## 2013-12-13 ENCOUNTER — Ambulatory Visit (HOSPITAL_COMMUNITY)
Admission: RE | Admit: 2013-12-13 | Discharge: 2013-12-13 | Disposition: A | Discharge status: Home / Self Care | Payer: Medicare PPO | Source: Ambulatory Visit | Attending: Family Medicine | Admitting: Family Medicine

## 2013-12-13 DIAGNOSIS — Z1231 Encounter for screening mammogram for malignant neoplasm of breast: Secondary | ICD-10-CM | POA: Insufficient documentation

## 2014-03-15 ENCOUNTER — Encounter (HOSPITAL_COMMUNITY): Payer: Self-pay | Admitting: Emergency Medicine

## 2014-03-15 ENCOUNTER — Emergency Department (HOSPITAL_COMMUNITY): Payer: Medicare PPO

## 2014-03-15 DIAGNOSIS — E785 Hyperlipidemia, unspecified: Secondary | ICD-10-CM | POA: Insufficient documentation

## 2014-03-15 DIAGNOSIS — I10 Essential (primary) hypertension: Secondary | ICD-10-CM | POA: Insufficient documentation

## 2014-03-15 DIAGNOSIS — Y9389 Activity, other specified: Secondary | ICD-10-CM | POA: Insufficient documentation

## 2014-03-15 DIAGNOSIS — X500XXA Overexertion from strenuous movement or load, initial encounter: Secondary | ICD-10-CM | POA: Insufficient documentation

## 2014-03-15 DIAGNOSIS — J180 Bronchopneumonia, unspecified organism: Secondary | ICD-10-CM | POA: Insufficient documentation

## 2014-03-15 DIAGNOSIS — Z7982 Long term (current) use of aspirin: Secondary | ICD-10-CM | POA: Insufficient documentation

## 2014-03-15 DIAGNOSIS — M810 Age-related osteoporosis without current pathological fracture: Secondary | ICD-10-CM | POA: Insufficient documentation

## 2014-03-15 DIAGNOSIS — Z87891 Personal history of nicotine dependence: Secondary | ICD-10-CM | POA: Insufficient documentation

## 2014-03-15 DIAGNOSIS — S2341XA Sprain of ribs, initial encounter: Secondary | ICD-10-CM | POA: Insufficient documentation

## 2014-03-15 DIAGNOSIS — S3981XA Other specified injuries of abdomen, initial encounter: Secondary | ICD-10-CM | POA: Insufficient documentation

## 2014-03-15 DIAGNOSIS — E119 Type 2 diabetes mellitus without complications: Secondary | ICD-10-CM | POA: Insufficient documentation

## 2014-03-15 DIAGNOSIS — E7800 Pure hypercholesterolemia, unspecified: Secondary | ICD-10-CM | POA: Insufficient documentation

## 2014-03-15 DIAGNOSIS — Z79899 Other long term (current) drug therapy: Secondary | ICD-10-CM | POA: Insufficient documentation

## 2014-03-15 DIAGNOSIS — Y929 Unspecified place or not applicable: Secondary | ICD-10-CM | POA: Insufficient documentation

## 2014-03-15 NOTE — ED Notes (Addendum)
Rt. Lower ribs hurt; "hurt ribs while reaching for my grand daughter on this ride at [facility]

## 2014-03-16 ENCOUNTER — Emergency Department (HOSPITAL_COMMUNITY)
Admission: EM | Admit: 2014-03-16 | Discharge: 2014-03-16 | Disposition: A | Discharge status: Home / Self Care | Payer: Medicare PPO | Attending: Emergency Medicine | Admitting: Emergency Medicine

## 2014-03-16 DIAGNOSIS — S29011A Strain of muscle and tendon of front wall of thorax, initial encounter: Secondary | ICD-10-CM

## 2014-03-16 MED ORDER — KETOROLAC TROMETHAMINE 30 MG/ML IJ SOLN
60.0000 mg | Freq: Once | INTRAMUSCULAR | Status: AC
  Administered 2014-03-16: 60 mg via INTRAMUSCULAR
  Filled 2014-03-16: qty 2

## 2014-03-16 MED ORDER — OXYCODONE-ACETAMINOPHEN 5-325 MG PO TABS: 2.0000 | ORAL_TABLET | ORAL | Status: DC | PRN

## 2014-03-16 MED ORDER — OXYCODONE-ACETAMINOPHEN 5-325 MG PO TABS
1.0000 | ORAL_TABLET | ORAL | Status: DC | PRN
  Administered 2014-03-16: 1 via ORAL
  Filled 2014-03-16: qty 1

## 2014-03-16 NOTE — ED Provider Notes (Signed)
CSN: [id number]     Arrival date & time 03/15/14  2307 History   First MD Initiated Contact with Patient 03/16/14 0037     Chief Complaint  Patient presents with  . Chest Pain     (Consider location/radiation/quality/duration/timing/severity/associated sxs/prior Treatment) HPI 68 year old female presents to emergency department with complaint of right-sided rib pain.  Patient reports one week ago she was at [employer].  She was riding a ride with her grandchildren and became concerned about one of them and lunged towards them, bumping up against the seat rest, and felt something pop in her chest.  Symptoms had been getting better over the last several days, but then started this morning, the pain returned.  It was worse.  She denies any shortness of breath, but reports when she takes deep breaths, the pain is worse.  She reports previous history of ribs and symptoms feel the same.  No bowel pain, no nausea no vomiting.  No diarrhea. Past Medical History  Diagnosis Date  . Hypertension   . Hyperlipemia   . Osteoporosis   . COPD (chronic obstructive pulmonary disease)   . Diabetes mellitus   . Hypercholesterolemia    Past Surgical History  Procedure Laterality Date  . Back surgery    . Appendectomy    . Tubal ligation     History reviewed. No pertinent family history. History  Substance Use Topics  . Smoking status: Former Research scientist (life sciences)  . Smokeless tobacco: Not on file  . Alcohol Use: Yes     Comment: occaisionally   OB History   Grav Para Term Preterm Abortions TAB SAB Ect Mult Living                 Review of Systems   See History of Present Illness; otherwise all other systems are reviewed and negative  Allergies  Review of patient's allergies indicates no known allergies.  Home Medications   Prior to Admission medications   Medication Sig Start Date End Date Taking? Authorizing Provider  albuterol (PROVENTIL HFA;VENTOLIN HFA) 108 (90 BASE) MCG/ACT inhaler Inhale 2 puffs  into the lungs every 6 (six) hours as needed. For wheeze or shortness of breath    Historical Provider, MD  alendronate (FOSAMAX) 70 MG tablet Take 70 mg by mouth every 7 (seven) days. Take with a full glass of water on an empty stomach.    Historical Provider, MD  aspirin 325 MG tablet Take 325 mg by mouth daily.    Historical Provider, MD  calcium carbonate (OS-CAL - DOSED IN MG OF ELEMENTAL CALCIUM) 1250 MG tablet Take 1 tablet by mouth daily.    Historical Provider, MD  chlorpheniramine-HYDROcodone (TUSSIONEX PENNKINETIC ER) 10-8 MG/5ML LQCR Take 5 mLs by mouth every 12 (twelve) hours. 01/26/12   Garald Balding, NP  Fluticasone-Salmeterol (ADVAIR) 250-50 MCG/DOSE AEPB Inhale 1 puff into the lungs every 12 (twelve) hours.    Historical Provider, MD  metFORMIN (GLUCOPHAGE) 500 MG tablet Take 1,000 mg by mouth 2 (two) times daily with a meal.    Historical Provider, MD  Multiple Vitamin (MULITIVITAMIN WITH MINERALS) TABS Take 1 tablet by mouth daily.    Historical Provider, MD  valsartan (DIOVAN) 160 MG tablet Take 160 mg by mouth daily.    Historical Provider, MD  valsartan (DIOVAN) 160 MG tablet Take 1 tablet (160 mg total) by mouth daily. 09/16/12   Billy Fischer, MD   BP 167/82  Pulse 83  Temp(Src) 98.2 F (36.8 C) (Oral)  Resp 18  SpO2 95% Physical Exam  Nursing note and vitals reviewed. Constitutional: She is oriented to person, place, and time. She appears well-developed and well-nourished.  HENT:  Head: Normocephalic and atraumatic.  Nose: Nose normal.  Mouth/Throat: Oropharynx is clear and moist.  Eyes: Conjunctivae and EOM are normal. Pupils are equal, round, and reactive to light.  Neck: Normal range of motion. Neck supple. No JVD present. No tracheal deviation present. No thyromegaly present.  Cardiovascular: Normal rate, regular rhythm, normal heart sounds and intact distal pulses.  Exam reveals no gallop and no friction rub.   No murmur heard. Pulmonary/Chest: Effort normal  and breath sounds normal. No stridor. No respiratory distress. She has no wheezes. She has no rales. She exhibits tenderness (tender to palpation across lower ribs from epigastrium wrapping around to posterior.  No crepitus no overlying skin changes.  No increase in pain with compression from front to back.).  Abdominal: Soft. Bowel sounds are normal. She exhibits no distension and no mass. There is tenderness (mild epigastric pain.  No referred pain with palpation elsewhere in the abdomen.  No liver enlargement noted). There is no rebound and no guarding.  Musculoskeletal: Normal range of motion. She exhibits no edema and no tenderness.  Lymphadenopathy:    She has no cervical adenopathy.  Neurological: She is alert and oriented to person, place, and time. She exhibits normal muscle tone. Coordination normal.  Skin: Skin is warm and dry. No rash noted. No erythema. No pallor.  Psychiatric: She has a normal mood and affect. Her behavior is normal. Judgment and thought content normal.    ED Course  Procedures (including critical care time) Labs Review Labs Reviewed - No data to display  Imaging Review Dg Chest 2 View  03/15/2014   CLINICAL DATA:  Right-sided chest pain.  EXAM: CHEST - 2 VIEW  COMPARISON:  DG CHEST 2 VIEW dated 09/05/2012; CT CHEST W/CM dated 11/03/2011  FINDINGS: Stable emphysematous lung disease. There is no evidence of pulmonary edema, consolidation, pneumothorax, nodule or pleural fluid. The heart size and mediastinal contours are normal. Old right-sided rib fracture appears stable. No acute fractures are identified by chest x-ray.  IMPRESSION: Stable emphysema.  No acute findings.   Electronically Signed   By: Aletta Edouard M.D.   On: 03/15/2014 23:46     EKG Interpretation None      MDM   Final diagnoses:  Chest wall muscle strain    68 year old female with right lower chest wall pain, possible muscle strain.  Plan for Toradol and Percocet.  Patient has local  primary care Dr. she can followup with.  Patient advised to use warm moist heat to help with pain.    Kalman Drape, MD 03/16/14 [phone]

## 2014-03-16 NOTE — Discharge Instructions (Signed)

## 2014-04-22 ENCOUNTER — Encounter (HOSPITAL_COMMUNITY): Payer: Self-pay | Admitting: Emergency Medicine

## 2014-04-22 ENCOUNTER — Emergency Department (HOSPITAL_COMMUNITY): Payer: Medicare PPO

## 2014-04-22 ENCOUNTER — Emergency Department (HOSPITAL_COMMUNITY)
Admission: EM | Admit: 2014-04-22 | Discharge: 2014-04-22 | Disposition: A | Discharge status: Home / Self Care | Payer: Medicare PPO | Attending: Emergency Medicine | Admitting: Emergency Medicine

## 2014-04-22 DIAGNOSIS — Z79899 Other long term (current) drug therapy: Secondary | ICD-10-CM | POA: Insufficient documentation

## 2014-04-22 DIAGNOSIS — M549 Dorsalgia, unspecified: Secondary | ICD-10-CM | POA: Insufficient documentation

## 2014-04-22 DIAGNOSIS — Z9889 Other specified postprocedural states: Secondary | ICD-10-CM | POA: Insufficient documentation

## 2014-04-22 DIAGNOSIS — I10 Essential (primary) hypertension: Secondary | ICD-10-CM | POA: Insufficient documentation

## 2014-04-22 DIAGNOSIS — IMO0002 Reserved for concepts with insufficient information to code with codable children: Secondary | ICD-10-CM | POA: Insufficient documentation

## 2014-04-22 DIAGNOSIS — Z86718 Personal history of other venous thrombosis and embolism: Secondary | ICD-10-CM | POA: Insufficient documentation

## 2014-04-22 DIAGNOSIS — E7800 Pure hypercholesterolemia, unspecified: Secondary | ICD-10-CM | POA: Insufficient documentation

## 2014-04-22 DIAGNOSIS — E785 Hyperlipidemia, unspecified: Secondary | ICD-10-CM | POA: Insufficient documentation

## 2014-04-22 DIAGNOSIS — M25519 Pain in unspecified shoulder: Secondary | ICD-10-CM | POA: Insufficient documentation

## 2014-04-22 DIAGNOSIS — Z7982 Long term (current) use of aspirin: Secondary | ICD-10-CM | POA: Insufficient documentation

## 2014-04-22 DIAGNOSIS — R071 Chest pain on breathing: Secondary | ICD-10-CM | POA: Insufficient documentation

## 2014-04-22 DIAGNOSIS — Z87891 Personal history of nicotine dependence: Secondary | ICD-10-CM | POA: Insufficient documentation

## 2014-04-22 DIAGNOSIS — R0789 Other chest pain: Secondary | ICD-10-CM

## 2014-04-22 DIAGNOSIS — J438 Other emphysema: Secondary | ICD-10-CM | POA: Insufficient documentation

## 2014-04-22 DIAGNOSIS — E119 Type 2 diabetes mellitus without complications: Secondary | ICD-10-CM | POA: Insufficient documentation

## 2014-04-22 LAB — BASIC METABOLIC PANEL
BUN: 12 mg/dL (ref 6–23)
CO2: 23 mEq/L (ref 19–32)
Calcium: 9.7 mg/dL (ref 8.4–10.5)
Chloride: 99 mEq/L (ref 96–112)
Creatinine, Ser: 0.58 mg/dL (ref 0.50–1.10)
GFR calc non Af Amer: >90 mL/min (ref >90)
Glucose, Bld: 105 mg/dL — ABNORMAL HIGH (ref 70–99)
Potassium: 4.1 mEq/L (ref 3.7–5.3)
Sodium: 137 mEq/L (ref 137–147)

## 2014-04-22 LAB — I-STAT TROPONIN, ED: Troponin i, poc: 0 ng/mL (ref 0.00–0.08)

## 2014-04-22 LAB — CBC
HCT: 38.7 % (ref 36.0–46.0)
HEMOGLOBIN: 13 g/dL (ref 12.0–15.0)
MCH: 31.1 pg (ref 26.0–34.0)
MCHC: 33.6 g/dL (ref 30.0–36.0)
MCV: 92.6 fL (ref 78.0–100.0)
Platelets: 252 10*3/uL (ref 150–400)
RBC: 4.18 MIL/uL (ref 3.87–5.11)
RDW: 14.1 % (ref 11.5–15.5)
WBC: 12.3 10*3/uL — ABNORMAL HIGH (ref 4.0–10.5)

## 2014-04-22 LAB — PRO B NATRIURETIC PEPTIDE: Pro B Natriuretic peptide (BNP): 69.2 pg/mL (ref 0–125)

## 2014-04-22 LAB — TROPONIN I: Troponin I: <0.3 ng/mL (ref <0.30)

## 2014-04-22 MED ORDER — IOHEXOL 350 MG/ML SOLN
50.0000 mL | Freq: Once | INTRAVENOUS | Status: AC | PRN
  Administered 2014-04-22: 50 mL via INTRAVENOUS

## 2014-04-22 MED ORDER — IBUPROFEN 600 MG PO TABS: 600.0000 mg | ORAL_TABLET | Freq: Four times a day (QID) | ORAL | Status: DC | PRN

## 2014-04-22 NOTE — ED Notes (Addendum)
Pt informed to hold metformin for 48 hours after IV contrast. Pt verbalize understanding.

## 2014-04-22 NOTE — Discharge Instructions (Signed)
° ° °  Chest Wall Pain Chest wall pain is pain in or around the bones and muscles of your chest. It may take up to 6 weeks to get better. It may take longer if you must stay physically active in your work and activities.  CAUSES  Chest wall pain may happen on its own. However, it may be caused by:  A viral illness like the flu.  Injury.  Coughing.  Exercise.  Arthritis.  Fibromyalgia.  Shingles. HOME CARE INSTRUCTIONS   Avoid overtiring physical activity. Try not to strain or perform activities that cause pain. This includes any activities using your chest or your abdominal and side muscles, especially if heavy weights are used.  Put ice on the sore area.  Put ice in a plastic bag.  Place a towel between your skin and the bag.  Leave the ice on for 15-20 minutes per hour while awake for the first 2 days.  Only take over-the-counter or prescription medicines for pain, discomfort, or fever as directed by your caregiver. SEEK IMMEDIATE MEDICAL CARE IF:   Your pain increases, or you are very uncomfortable.  You have a fever.  Your chest pain becomes worse.  You have new, unexplained symptoms.  You have nausea or vomiting.  You feel sweaty or lightheaded.  You have a cough with phlegm (sputum), or you cough up blood. MAKE SURE YOU:   Understand these instructions.  Will watch your condition.  Will get help right away if you are not doing well or get worse. Document Released: 11/16/2005 Document Revised: 02/08/2012 Document Reviewed: 07/13/2011 [facility] Patient Information 2014 [place], [place]. Metformin and X-ray Contrast Studies For some X-ray exams, a contrast dye is used. Contrast dye is a type of medicine used to make the X-ray image clearer. The contrast dye is given to the patient through a vein (intravenously). If you need to have this type of X-ray exam and you take a medication called metformin, your caregiver may have you stop taking metformin before the  exam.  LACTIC ACIDOSIS In rare cases, a serious medical condition called lactic acidosis can develop in people who take metformin and receive contrast dye. The following conditions can increase the risk of this complication:   Kidney failure.  Liver problems.  Certain types of heart problems such as:  Heart failure.  Heart attack.  Heart infection.  Heart valve problems.  Alcohol abuse. If left untreated, lactic acidosis can lead to coma.  SYMPTOMS OF LACTIC ACIDOSIS Symptoms of lactic acidosis can include:  Rapid breathing (hyperventilation).  Neurologic symptoms such as:  Headaches.  Confusion.  Dizziness.  Excessive sweating.  Feeling sick to your stomach (nauseous) or throwing up (vomiting). AFTER THE X-RAY EXAM  Stay well-hydrated. Drink fluids as instructed by your caregiver.  If you have a risk of developing lactic acidosis, blood tests may be done to make sure your kidney function is okay.  Metformin is usually stopped for 48 hours after the X-ray exam. Ask your caregiver when you can start taking metformin again. SEEK MEDICAL CARE IF:   You have shortness of breath or difficulty breathing.  You develop a headache that does not go away.  You have nausea or vomiting.  You urinate more than normal.  You develop a skin rash and have:  Redness.  Swelling.  Itching. Document Released: 11/04/2009 Document Revised: 02/08/2012 Document Reviewed: 11/04/2009 [facility] Patient Information 2014 [place], [place].

## 2014-04-22 NOTE — Progress Notes (Signed)
Patient advised to discuss her blood sugar management strategy with EDP prior to discharge home- Metformin to be held for 48 hours from 1530 today (Resume May 26 after 1530)

## 2014-04-22 NOTE — ED Notes (Addendum)
Pt states that she began having left shoulder pain approx 5 days ago. Pain occurred with movement. Pt now has SOB and "pain in her lung" on the left side starting today. SOB worse with exertion. NAD noted pt speaking in full sentences ambulatory at triage. Pt reports traveling by car to [place] in April. Pt has hx of blood clot in leg.

## 2014-04-22 NOTE — ED Notes (Signed)
Pt states that she has had pain in her left shouder,upper arm, scapular area, left upper chest for the past week. States she has had a little cough. States she did travel to and from [place] by car at the end of April. States history of blood clot in her ankle after surgery a few years ago. Denies hx of pe. Pt is warm and dry. She is talkative and speaks in complete sentences. Lungs are clear bilaterally.

## 2014-04-22 NOTE — ED Provider Notes (Signed)
CSN: [id number]     Arrival date & time 04/22/14  1223 History   First MD Initiated Contact with Patient 04/22/14 1237     Chief Complaint  Patient presents with  . Shoulder Pain  . Shortness of Breath    Patient is a 68 y.o. female presenting with shoulder pain and shortness of breath.  Shoulder Pain Associated symptoms include coughing. Pertinent negatives include no abdominal pain, chills, fever, nausea, rash or sore throat.  Shortness of Breath Associated symptoms: cough   Associated symptoms: no abdominal pain, no fever, no rash and no sore throat    68 yo female with h/o emphysema, HTN, HLD, DM2, h/o right leg DVT s/p surgery in 2007 who presents for evaluation of shortness of breath and left sided mid back pain.  Patient states that she started having nagging pain on the left side of her back, worst with movement and mildly improved with heating pad. Pain continued to persist and this morning and she developed increased work of breathing, worst with ambulation but still present at rest. She tried albuterol which did not help. She denies any chest pain or palpitations.  She also has had 2 weeks of left shoulder pain, worst with movement of the arm above her head. Better with rest. No history of trauma or change in activity.  She has a chronic cough which is unchanged in the last several weeks.  She has a history of DVT following surgery in 2007 for which she was treated with coumadin for 6 months.  She also reports a >8hr car trip in April to [place]. No other recent immobilization. No recent hormone therapy.   Past Medical History  Diagnosis Date  . Hypertension   . Hyperlipemia   . Osteoporosis   . COPD (chronic obstructive pulmonary disease)   . Diabetes mellitus   . Hypercholesterolemia    Past Surgical History  Procedure Laterality Date  . Back surgery    . Appendectomy    . Tubal ligation     History reviewed. No pertinent family history. History  Substance Use  Topics  . Smoking status: Former Research scientist (life sciences)  . Smokeless tobacco: Not on file  . Alcohol Use: Yes     Comment: occaisionally   OB History   Grav Para Term Preterm Abortions TAB SAB Ect Mult Living                 Review of Systems  Constitutional: Negative for fever, chills and appetite change.  HENT: Negative for rhinorrhea and sore throat.   Respiratory: Positive for cough and shortness of breath.   Gastrointestinal: Negative for nausea, abdominal pain and constipation.  Genitourinary: Negative for dysuria and frequency.  Musculoskeletal: Positive for back pain.       Left shoulder pain  Skin: Negative for rash.  Neurological: Negative for dizziness and light-headedness.  Psychiatric/Behavioral: Negative for confusion.    Allergies  Review of patient's allergies indicates no known allergies.  Home Medications   Prior to Admission medications   Medication Sig Start Date End Date Taking? Authorizing Provider  albuterol (PROVENTIL HFA;VENTOLIN HFA) 108 (90 BASE) MCG/ACT inhaler Inhale 2 puffs into the lungs every 6 (six) hours as needed. For wheeze or shortness of breath   Yes Historical Provider, MD  alendronate (FOSAMAX) 70 MG tablet Take 70 mg by mouth every 7 (seven) days. Take with a full glass of water on an empty stomach. Takes on Tuesday.   Yes Historical Provider, MD  ALPRAZolam Duanne Moron)  0.25 MG tablet Take 0.25 mg by mouth at bedtime as needed for anxiety.   Yes Historical Provider, MD  aspirin 325 MG tablet Take 325 mg by mouth daily.   Yes Historical Provider, MD  atorvastatin (LIPITOR) 40 MG tablet Take 40 mg by mouth daily.   Yes Historical Provider, MD  calcium carbonate (OS-CAL - DOSED IN MG OF ELEMENTAL CALCIUM) 1250 MG tablet Take 1 tablet by mouth 2 (two) times daily with a meal.    Yes Historical Provider, MD  Menthol-Zinc Oxide (GOLD BOND EX) Apply 1 application topically daily as needed (for feet and hands).   Yes Historical Provider, MD  metFORMIN (GLUCOPHAGE)  1000 MG tablet Take 1,000-1,500 mg by mouth 2 (two) times daily with a meal. Takes 1.5 tabs in am and 1 tab in pm   Yes Historical Provider, MD  mometasone-formoterol (DULERA) 100-5 MCG/ACT AERO Inhale 2 puffs into the lungs 2 (two) times daily.   Yes Historical Provider, MD  Multiple Vitamin (MULITIVITAMIN WITH MINERALS) TABS Take 1 tablet by mouth daily.   Yes Historical Provider, MD  valsartan (DIOVAN) 80 MG tablet Take 80 mg by mouth daily.   Yes Historical Provider, MD   BP 127/66  Pulse 82  Temp(Src) 98 F (36.7 C) (Oral)  Resp 22  SpO2 96% Physical Exam Physical Examination: General appearance - alert, well appearing, and in no distress Mental status - alert, oriented to person, place, and time Eyes - pupils equal and reactive, extraocular eye movements intact Mouth - mucous membranes moist, pharynx normal without lesions Neck - supple, no significant adenopathy Chest - clear to auscultation, no wheezes, rales or rhonchi, symmetric air entry Heart - normal rate, regular rhythm, normal S1, S2, no murmurs, rubs, clicks or gallops Abdomen - soft, nontender, nondistended, no masses or organomegaly Extremities - no lower extremity swelling Skin - no rashes Left shoulder - tenderness to palpation along posterior aspect of shoulder, normal range of motion above shoulders, normal strength with internal and external rotation.   ED Course  Procedures (including critical care time) Labs Review Labs Reviewed  CBC - Abnormal; Notable for the following:    WBC 12.3 (*)    All other components within normal limits  BASIC METABOLIC PANEL - Abnormal; Notable for the following:    Glucose, Bld 105 (*)    All other components within normal limits  PRO B NATRIURETIC PEPTIDE  TROPONIN I  I-STAT TROPOININ, ED    Imaging Review No results found.   EKG Interpretation   Date/Time:  Sunday Apr 22 2014 12:30:12 EDT Ventricular Rate:  92 PR Interval:  154 QRS Duration: 84 QT Interval:   358 QTC Calculation: 442 R Axis:   87 Text Interpretation:  Normal sinus rhythm No significant change since last  tracing Confirmed by BEATON  MD, ROBERT ([id number]) on 04/22/2014 1:51:32 PM      MDM   Final diagnoses:  None    68 yo female with h/o COPD, DM2, HLD, HTN, h/o DVT s/p surgery who presents with left sided back pain and shortness of breath. Concern for PE given presentation and clot history. No hypoxemia and HR in the 90's.  - troponin, CMP and CBC unremarkable - CTA to rule out PE - if normal, will discharge home with NSAIDs for chest wall pain.  - continue dulera and albuterol prn   Liam Graham, PGY-3 Family Medicine Resident      Kandis Nab, MD 04/22/14 [phone]

## 2014-04-23 NOTE — ED Provider Notes (Signed)
I saw and evaluated the patient, reviewed the resident's note and I agree with the findings and plan.   .Face to face Exam:  General:  Awake HEENT:  Atraumatic Resp:  Normal effort Abd:  Nondistended Neuro:No focal weakness  Dot Lanes, MD 04/23/14 [phone]

## 2014-09-11 ENCOUNTER — Encounter: Payer: Self-pay | Admitting: *Deleted

## 2014-11-14 ENCOUNTER — Encounter: Payer: Self-pay | Admitting: *Deleted

## 2014-11-14 ENCOUNTER — Encounter: Payer: Commercial Managed Care - HMO | Attending: Internal Medicine | Admitting: *Deleted

## 2014-11-14 VITALS — Ht 65.0 in | Wt 122.2 lb

## 2014-11-14 DIAGNOSIS — E119 Type 2 diabetes mellitus without complications: Secondary | ICD-10-CM | POA: Insufficient documentation

## 2014-11-14 DIAGNOSIS — Z713 Dietary counseling and surveillance: Secondary | ICD-10-CM | POA: Diagnosis not present

## 2014-11-14 NOTE — Progress Notes (Signed)
Diabetes Self-Management Education   Appt. Start Time: 0930 Appt. End Time: 1100  11/14/2014  [name], identified by name and date of birth, is a 68 y.o. female with a diagnosis of Diabetes: Type 2.  Other people present during visit:  Patient   ASSESSMENT  Height 5\' 5"  (1.651 m), weight 122 lb 3.2 oz (55.43 kg). Body mass index is 20.34 kg/(m^2).  Initial Visit Information:  Are you currently following a meal plan?: No   Are you taking your medications as prescribed?: Yes    How often do you need to have someone help you when you read instructions, pamphlets, or other written materials from your doctor or pharmacy?: 1 - Never   Psychosocial:   Patient Belief/Attitude about Diabetes: Motivated to manage diabetes Self-care barriers:  (finances) Self-management support: Doctor's office, CDE visits, Family Other persons present: Patient Patient Concerns: Nutrition/Meal planning, Monitoring, Healthy Lifestyle Special Needs: None Preferred Learning Style: No preference indicated Learning Readiness: Change in progress  Complications:   Last HgB A1C per patient/outside source: 7 mg/dL How often do you check your blood sugar?: 1-2 times/day Fasting Blood glucose range (mg/dL):  (120-140 has been as high as 150mg /dl) Have you had a dilated eye exam in the past 12 months?: Yes Have you had a dental exam in the past 12 months?: No  Diet Intake:  Breakfast: eggs, oatmeal, cream of wheat, / pancakes, waffles, sugar free syrup, OJ, V8 juice Lunch: grilled cheese sandwich, tomato soup, chicken soup, tuna fish sandwich, tuna salad , (Pizza) Dinner: Astronomer, hot dogs, hamburg Beverage(s): V8juice, regular coffee, hot chocolate  Exercise:  Exercise: ADL's (1 time a week takes an exercise class)  Individualized Plan for Diabetes Self-Management Training:   Learning Objective:  Patient will have a greater understanding of diabetes self-management. Patient  education plan per assessed needs and concerns is to attend individual sessions     Education Topics Reviewed with Patient Today:  Factors that contribute to the development of diabetes, Explored patient's options for treatment of their diabetes, Definition of diabetes, type 1 and 2, and the diagnosis of diabetes Role of diet in the treatment of diabetes and the relationship between the three main macronutrients and blood glucose level, Food label reading, portion sizes and measuring food., Carbohydrate counting, Meal options for control of blood glucose level and chronic complications. Role of exercise on diabetes management, blood pressure control and cardiac health. Reviewed patients medication for diabetes, action, purpose, timing of dose and side effects. Purpose and frequency of SMBG., Identified appropriate SMBG and/or A1C goals., Daily foot exams, Yearly dilated eye exam   Relationship between chronic complications and blood glucose control, Lipid levels, blood glucose control and heart disease, Dental care (very poor dentitian) Role of stress on diabetes (Very high stress level. Cares for her 5 grandchildren during the day while parents at work.. Minimal financial resources)   Lifestyle issues that need to be addressed for better diabetes care  PATIENTS GOALS/Plan (Developed by the patient):  Nutrition: General guidelines for healthy choices and portions discussed Medications: take my medication as prescribed Monitoring : test blood glucose pre and post meals as discussed Reducing Risk: get labs drawn, do foot checks daily   Patient Instructions  Plan:  Aim for 2 Carb Choices per meal (30 grams) +/- 1 either way  Aim for 0-15 Carbs per snack if hungry  Include protein in moderation with your meals and snacks Consider reading food labels for Total Carbohydrate and Fat Grams  of foods Consider  increasing your activity level by walking for 30 minutes daily as tolerated Consider  checking BG at alternate times per day Fasting one day then 2 hrs after dinner on another day  Continue taking medication as directed by MD  Expected Outcomes:  Demonstrated interest in learning. Expect positive outcomes  Education material provided: Living Well with Diabetes, A1C conversion sheet, Meal plan card, My Plate, Snack sheet and Support group flyer  If problems or questions, patient to contact team via:  Phone  Future DSME appointment: PRN            0

## 2014-11-14 NOTE — Patient Instructions (Signed)
Plan:  Aim for 2 Carb Choices per meal (30 grams) +/- 1 either way  Aim for 0-15 Carbs per snack if hungry  Include protein in moderation with your meals and snacks Consider reading food labels for Total Carbohydrate and Fat Grams of foods Consider  increasing your activity level by walking for 30 minutes daily as tolerated Consider checking BG at alternate times per day Fasting one day then 2 hrs after dinner on another day  Continue taking medication as directed by MD

## 2014-12-03 ENCOUNTER — Other Ambulatory Visit (HOSPITAL_COMMUNITY): Payer: Self-pay | Admitting: Family Medicine

## 2014-12-03 DIAGNOSIS — Z1231 Encounter for screening mammogram for malignant neoplasm of breast: Secondary | ICD-10-CM

## 2014-12-05 DIAGNOSIS — M810 Age-related osteoporosis without current pathological fracture: Secondary | ICD-10-CM | POA: Diagnosis not present

## 2014-12-05 DIAGNOSIS — Z79899 Other long term (current) drug therapy: Secondary | ICD-10-CM | POA: Diagnosis not present

## 2014-12-05 DIAGNOSIS — D649 Anemia, unspecified: Secondary | ICD-10-CM | POA: Diagnosis not present

## 2014-12-05 DIAGNOSIS — I10 Essential (primary) hypertension: Secondary | ICD-10-CM | POA: Diagnosis not present

## 2014-12-05 DIAGNOSIS — E119 Type 2 diabetes mellitus without complications: Secondary | ICD-10-CM | POA: Diagnosis not present

## 2014-12-05 DIAGNOSIS — J45909 Unspecified asthma, uncomplicated: Secondary | ICD-10-CM | POA: Diagnosis not present

## 2014-12-05 DIAGNOSIS — F419 Anxiety disorder, unspecified: Secondary | ICD-10-CM | POA: Diagnosis not present

## 2014-12-05 DIAGNOSIS — E780 Pure hypercholesterolemia: Secondary | ICD-10-CM | POA: Diagnosis not present

## 2014-12-19 ENCOUNTER — Ambulatory Visit (HOSPITAL_COMMUNITY)
Admission: RE | Admit: 2014-12-19 | Discharge: 2014-12-19 | Disposition: A | Discharge status: Home / Self Care | Payer: Commercial Managed Care - HMO | Source: Ambulatory Visit | Attending: Family Medicine | Admitting: Family Medicine

## 2014-12-19 DIAGNOSIS — Z1231 Encounter for screening mammogram for malignant neoplasm of breast: Secondary | ICD-10-CM | POA: Diagnosis not present

## 2015-03-11 DIAGNOSIS — R002 Palpitations: Secondary | ICD-10-CM | POA: Diagnosis not present

## 2015-03-11 DIAGNOSIS — F419 Anxiety disorder, unspecified: Secondary | ICD-10-CM | POA: Diagnosis not present

## 2015-03-11 DIAGNOSIS — E119 Type 2 diabetes mellitus without complications: Secondary | ICD-10-CM | POA: Diagnosis not present

## 2015-05-06 DIAGNOSIS — R51 Headache: Secondary | ICD-10-CM | POA: Diagnosis not present

## 2015-05-07 ENCOUNTER — Other Ambulatory Visit: Payer: Self-pay | Admitting: Family Medicine

## 2015-05-07 DIAGNOSIS — S0990XD Unspecified injury of head, subsequent encounter: Secondary | ICD-10-CM

## 2015-05-10 ENCOUNTER — Encounter (INDEPENDENT_AMBULATORY_CARE_PROVIDER_SITE_OTHER): Payer: Self-pay

## 2015-05-10 ENCOUNTER — Ambulatory Visit
Admission: RE | Admit: 2015-05-10 | Discharge: 2015-05-10 | Disposition: A | Discharge status: Home / Self Care | Payer: Commercial Managed Care - HMO | Source: Ambulatory Visit | Attending: Family Medicine | Admitting: Family Medicine

## 2015-05-10 DIAGNOSIS — R51 Headache: Secondary | ICD-10-CM | POA: Diagnosis not present

## 2015-05-10 DIAGNOSIS — S0990XA Unspecified injury of head, initial encounter: Secondary | ICD-10-CM | POA: Diagnosis not present

## 2015-05-10 DIAGNOSIS — S0990XD Unspecified injury of head, subsequent encounter: Secondary | ICD-10-CM

## 2015-06-12 DIAGNOSIS — R51 Headache: Secondary | ICD-10-CM | POA: Diagnosis not present

## 2015-06-12 DIAGNOSIS — J069 Acute upper respiratory infection, unspecified: Secondary | ICD-10-CM | POA: Diagnosis not present

## 2015-06-17 ENCOUNTER — Encounter (HOSPITAL_COMMUNITY): Payer: Self-pay | Admitting: Emergency Medicine

## 2015-06-17 ENCOUNTER — Emergency Department (HOSPITAL_COMMUNITY)
Admission: EM | Admit: 2015-06-17 | Discharge: 2015-06-17 | Disposition: A | Discharge status: Home / Self Care | Payer: Commercial Managed Care - HMO | Attending: Emergency Medicine | Admitting: Emergency Medicine

## 2015-06-17 ENCOUNTER — Emergency Department (HOSPITAL_COMMUNITY): Payer: Commercial Managed Care - HMO

## 2015-06-17 DIAGNOSIS — E780 Pure hypercholesterolemia: Secondary | ICD-10-CM | POA: Insufficient documentation

## 2015-06-17 DIAGNOSIS — M1990 Unspecified osteoarthritis, unspecified site: Secondary | ICD-10-CM | POA: Diagnosis not present

## 2015-06-17 DIAGNOSIS — I10 Essential (primary) hypertension: Secondary | ICD-10-CM | POA: Diagnosis not present

## 2015-06-17 DIAGNOSIS — Z79899 Other long term (current) drug therapy: Secondary | ICD-10-CM | POA: Diagnosis not present

## 2015-06-17 DIAGNOSIS — J449 Chronic obstructive pulmonary disease, unspecified: Secondary | ICD-10-CM | POA: Insufficient documentation

## 2015-06-17 DIAGNOSIS — M129 Arthropathy, unspecified: Secondary | ICD-10-CM | POA: Diagnosis not present

## 2015-06-17 DIAGNOSIS — E119 Type 2 diabetes mellitus without complications: Secondary | ICD-10-CM | POA: Diagnosis not present

## 2015-06-17 DIAGNOSIS — R51 Headache: Secondary | ICD-10-CM | POA: Diagnosis not present

## 2015-06-17 DIAGNOSIS — M810 Age-related osteoporosis without current pathological fracture: Secondary | ICD-10-CM | POA: Insufficient documentation

## 2015-06-17 DIAGNOSIS — Z87891 Personal history of nicotine dependence: Secondary | ICD-10-CM | POA: Insufficient documentation

## 2015-06-17 DIAGNOSIS — E785 Hyperlipidemia, unspecified: Secondary | ICD-10-CM | POA: Insufficient documentation

## 2015-06-17 DIAGNOSIS — Z7982 Long term (current) use of aspirin: Secondary | ICD-10-CM | POA: Diagnosis not present

## 2015-06-17 DIAGNOSIS — M542 Cervicalgia: Secondary | ICD-10-CM | POA: Diagnosis present

## 2015-06-17 DIAGNOSIS — M47812 Spondylosis without myelopathy or radiculopathy, cervical region: Secondary | ICD-10-CM

## 2015-06-17 DIAGNOSIS — M5022 Other cervical disc displacement, mid-cervical region: Secondary | ICD-10-CM | POA: Diagnosis not present

## 2015-06-17 LAB — I-STAT CHEM 8, ED
BUN: 14 mg/dL (ref 6–20)
Calcium, Ion: 1.15 mmol/L (ref 1.13–1.30)
Chloride: 99 mmol/L — ABNORMAL LOW (ref 101–111)
Creatinine, Ser: 0.6 mg/dL (ref 0.44–1.00)
GLUCOSE: 195 mg/dL — AB (ref 65–99)
HCT: 45 % (ref 36.0–46.0)
HEMOGLOBIN: 15.3 g/dL — AB (ref 12.0–15.0)
POTASSIUM: 4.3 mmol/L (ref 3.5–5.1)
Sodium: 135 mmol/L (ref 135–145)
TCO2: 22 mmol/L (ref 0–100)

## 2015-06-17 MED ORDER — FENTANYL CITRATE (PF) 100 MCG/2ML IJ SOLN
50.0000 ug | Freq: Once | INTRAMUSCULAR | Status: AC
  Administered 2015-06-17: 50 ug via INTRAVENOUS
  Filled 2015-06-17: qty 2

## 2015-06-17 MED ORDER — ONDANSETRON HCL 4 MG/2ML IJ SOLN
4.0000 mg | Freq: Once | INTRAMUSCULAR | Status: AC
  Administered 2015-06-17: 4 mg via INTRAVENOUS
  Filled 2015-06-17: qty 2

## 2015-06-17 MED ORDER — KETOROLAC TROMETHAMINE 30 MG/ML IJ SOLN
15.0000 mg | Freq: Once | INTRAMUSCULAR | Status: AC
  Administered 2015-06-17: 15 mg via INTRAVENOUS
  Filled 2015-06-17: qty 1

## 2015-06-17 MED ORDER — IBUPROFEN 600 MG PO TABS: 600.0000 mg | ORAL_TABLET | Freq: Four times a day (QID) | ORAL | Status: DC | PRN

## 2015-06-17 MED ORDER — TRAMADOL HCL 50 MG PO TABS: 50.0000 mg | ORAL_TABLET | Freq: Four times a day (QID) | ORAL | Status: DC | PRN

## 2015-06-17 NOTE — Discharge Instructions (Signed)
Headache and Arthritis Headaches and arthritis are common problems. This causes an interest in the possible role of arthritis in causing headaches. Several major forms of arthritis exist. Two of the most common types are:  Rheumatoid arthritis.  Osteoarthritis. Rheumatoid arthritis may begin at any age. It is a condition in which the body attacks some of its own tissues, thinking they do not belong. This leads to destruction of the bony areas around the joints. This condition may afflict any of the body's joints. It usually produces a deformity of the joint. The hands and fingers no longer appear straight but often appear angled towards one side. In some cases, the spine may be involved. Most often it is the vertebrae of the neck (cervical spine). The areas of the neck most commonly afflicted by rheumatoid arthritis are the first and second cervical vertebrae. Curiously, rheumatoid arthritis, though it often produces severe deformities, is not always painful.  The more common form of arthritis is osteoarthritis. It is a wear-and-tear form of arthritis. It usually does not produce deformity of the joints or destruction of the bony tissues. Rather the ligaments weaken. They may be calcified due to the body's attempt to heal the damage. The larger joints of the body and those joints that take the most stress and strain are the most often affected. In the neck region this osteoarthritis usually involves the fifth, sixth and seventh vertebrae. This is because the effects of posture produce the most fatigue on them. Osteoarthritis is often more painful than rheumatoid arthritis.  During workups for arthritis, a test evaluating inflammation, (the sedimentation rate) often is performed. In rheumatoid arthritis, this test will usually be elevated. Other tests for inflammation may also be elevated. In patients with osteoarthritis, x-rays of the neck or jaw joints will show changes from "lipping" of the vertebrae. This  is caused by calcium deposits in the ligaments. Or they may show narrowing of the space between the vertebrae, or spur formation (from calcium deposits). If severe, it may cause obstruction of the holes where the nerves pass from the spine to the body. In rheumatoid arthritis, dislocation of vertebrae may occur in the upper neck. CT scan and MRI in patients with osteoarthritis may show bulging of the discs that cushion the vertebrae. In the most severe cases, herniation of the discs may occur.  Headaches, felt as a pain in the neck, may be caused by arthritis if the first, second or third vertebrae are involved. This condition is due to the nerves that supply the scalp only originating from this area of the spine. Neck pain itself, whether alone or coupled with headaches, can involve any portion of the neck. If the jaw is involved, the symptoms are similar to those of Temporomandibular Joint Syndrome (TMJ).  The progressive severity of rheumatoid arthritis may be slowed by a variety of potent medications. In osteoarthritis, its progression is not usually hindered by medication. The following may be helpful in slowing the advancement of the disorder:  Lifestyle adjustment.  Exercise.  Rest.  Weight loss. Medications, such as the nonsteroidal anti-inflammatory agents (NSAIDs), are useful. They may reduce the pain and improve the reduced motion which occurs in joints afflicted by arthritis. From some studies, the use of acetaminophen appears to be as effective in controlling the pain of arthritis as the NSAIDs. Physical modalities may also be useful for arthritis. They include:  Heat.  Massage.  Exercise. But physical therapy must be prescribed by a caregiver, just as most medications  for arthritis.  Document Released: 02/06/2004 Document Revised: 11/21/2013 Document Reviewed: 02/19/2014 [employer] Patient Information 2015 [place], [place]. This information is not intended to replace advice given to  you by your health care provider. Make sure you discuss any questions you have with your health care provider.

## 2015-06-17 NOTE — ED Notes (Signed)
Pt c/o headache x 6 weeks. Pt reports stiff neck also. Pt denies night sweats or fever.

## 2015-06-17 NOTE — ED Provider Notes (Signed)
CSN: [id number]     Arrival date & time 06/17/15  0920 History   First MD Initiated Contact with Patient 06/17/15 [phone]     Chief Complaint  Patient presents with  . Headache  . Neck Pain      HPI Patient presents with chief complaint of neck pain for last several days now is having a headache.  She had a CT done of her head recently by her primary care doctor and was read as negative.  She's had no evaluation of her neck.  Denies any weakness or urinary/fecal incontinence. Past Medical History  Diagnosis Date  . Hypertension   . Hyperlipemia   . Osteoporosis   . COPD (chronic obstructive pulmonary disease)   . Diabetes mellitus   . Hypercholesterolemia    Past Surgical History  Procedure Laterality Date  . Back surgery    . Appendectomy    . Tubal ligation     No family history on file. History  Substance Use Topics  . Smoking status: Former Research scientist (life sciences)  . Smokeless tobacco: Not on file  . Alcohol Use: Yes     Comment: occaisionally   OB History    No data available     Review of Systems  All other systems reviewed and are negative.     Allergies  Review of patient's allergies indicates no known allergies.  Home Medications   Prior to Admission medications   Medication Sig Start Date End Date Taking? Authorizing Provider  albuterol (PROVENTIL HFA;VENTOLIN HFA) 108 (90 BASE) MCG/ACT inhaler Inhale 2 puffs into the lungs every 6 (six) hours as needed. For wheeze or shortness of breath   Yes Historical Provider, MD  alendronate (FOSAMAX) 70 MG tablet Take 70 mg by mouth every 7 (seven) days. Take with a full glass of water on an empty stomach. Takes on Tuesday.   Yes Historical Provider, MD  ALPRAZolam Duanne Moron) 0.25 MG tablet Take 0.25 mg by mouth at bedtime as needed for anxiety.   Yes Historical Provider, MD  aspirin 325 MG tablet Take 325 mg by mouth daily.   Yes Historical Provider, MD  atorvastatin (LIPITOR) 40 MG tablet Take 40 mg by mouth daily.   Yes Historical  Provider, MD  calcium carbonate (OS-CAL - DOSED IN MG OF ELEMENTAL CALCIUM) 1250 MG tablet Take 1 tablet by mouth 2 (two) times daily with a meal.    Yes Historical Provider, MD  glimepiride (AMARYL) 1 MG tablet Take 1 mg by mouth daily with breakfast.   Yes Historical Provider, MD  Menthol-Zinc Oxide (GOLD BOND EX) Apply 1 application topically daily as needed (for feet and hands).   Yes Historical Provider, MD  metFORMIN (GLUCOPHAGE) 1000 MG tablet Take 1,000-1,500 mg by mouth 2 (two) times daily with a meal. Takes 1.5 tabs in am and 1 tab in pm   Yes Historical Provider, MD  mometasone-formoterol (DULERA) 100-5 MCG/ACT AERO Inhale 2 puffs into the lungs 2 (two) times daily.   Yes Historical Provider, MD  Multiple Vitamin (MULITIVITAMIN WITH MINERALS) TABS Take 1 tablet by mouth daily.   Yes Historical Provider, MD  valsartan (DIOVAN) 80 MG tablet Take 80 mg by mouth daily.   Yes Historical Provider, MD  ibuprofen (ADVIL,MOTRIN) 600 MG tablet Take 1 tablet (600 mg total) by mouth every 6 (six) hours as needed. 06/17/15   Leonard Schwartz, MD  traMADol (ULTRAM) 50 MG tablet Take 1 tablet (50 mg total) by mouth every 6 (six) hours as needed. 06/17/15  Leonard Schwartz, MD   BP 160/78 mmHg  Pulse 102  Temp(Src) 98 F (36.7 C) (Oral)  Resp 20  Ht 5\' 5"  (1.651 m)  Wt 126 lb (57.153 kg)  BMI 20.97 kg/m2  SpO2 95% Physical Exam  Constitutional: She is oriented to person, place, and time. She appears well-developed and well-nourished. No distress.  HENT:  Head: Normocephalic and atraumatic.  Eyes: Pupils are equal, round, and reactive to light.  Neck: Trachea normal. Muscular tenderness present. Decreased range of motion present. No thyroid mass present.  Cardiovascular: Normal rate and intact distal pulses.   Pulmonary/Chest: No respiratory distress.  Abdominal: Normal appearance. She exhibits no distension.  Neurological: She is alert and oriented to person, place, and time. No cranial nerve  deficit.  Skin: Skin is warm and dry. No rash noted.  Psychiatric: She has a normal mood and affect. Her behavior is normal.  Nursing note and vitals reviewed.   ED Course  Procedures (including critical care time) Medications  fentaNYL (SUBLIMAZE) injection 50 mcg (50 mcg Intravenous Given 06/17/15 0958)  ondansetron (ZOFRAN) injection 4 mg (4 mg Intravenous Given 06/17/15 1016)  ketorolac (TORADOL) 30 MG/ML injection 15 mg (15 mg Intravenous Given 06/17/15 1156)    Labs Review Labs Reviewed  I-STAT CHEM 8, ED - Abnormal; Notable for the following:    Chloride 99 (*)    Glucose, Bld 195 (*)    Hemoglobin 15.3 (*)    All other components within normal limits    Imaging Review Mr Cervical Spine Wo Contrast  06/17/2015   CLINICAL DATA:  Severe neck pain and base of skull pain.  EXAM: MRI CERVICAL SPINE WITHOUT CONTRAST  TECHNIQUE: Multiplanar, multisequence MR imaging of the cervical spine was performed. No intravenous contrast was administered.  COMPARISON:  Chest CT scans dated 05/23/2014, 11/03/2011 and 03/23/2011  FINDINGS: The visualized intracranial contents, paraspinal soft tissues, and cervical spinal cord appear normal. There is chronic occlusion of the left vertebral artery at least since 03/23/2011.  Craniocervical junction:  Normal.  C1-2: There are degenerative changes between the anterior arch of C1 and the odontoid extending onto the medial aspect of the left facet joint. There are small bilateral facet joint effusions at C1-2.  C2-3 through C4-5:  Normal.  C5-6: Tiny disc bulge to the right of midline with an osteophytes. No neural impingement.  C6-7: Small disc protrusion central and to the right without focal neural impingement. No foraminal impingement.  C7-T1: Node in the superior endplate of T1. No disc bulging or protrusion. No foraminal stenosis.  T1-2:  Normal.  IMPRESSION: 1. Degenerative changes between the anterior arch of C1 and the odontoid process of C2 with small  bilateral facet joint effusions at C1-2. 2. Small small disc protrusion central and to the right at C6-7 without neural impingement.   Electronically Signed   By: Lorriane Shire M.D.   On: 06/17/2015 11:42      MDM   Final diagnoses:  Cervical arthritis        Leonard Schwartz, MD 06/17/15 1204

## 2015-06-25 DIAGNOSIS — M542 Cervicalgia: Secondary | ICD-10-CM | POA: Diagnosis not present

## 2015-07-09 DIAGNOSIS — M542 Cervicalgia: Secondary | ICD-10-CM | POA: Diagnosis not present

## 2015-07-11 DIAGNOSIS — R1032 Left lower quadrant pain: Secondary | ICD-10-CM | POA: Diagnosis not present

## 2015-07-15 DIAGNOSIS — R1032 Left lower quadrant pain: Secondary | ICD-10-CM | POA: Diagnosis not present

## 2015-07-23 DIAGNOSIS — M542 Cervicalgia: Secondary | ICD-10-CM | POA: Diagnosis not present

## 2015-08-02 ENCOUNTER — Emergency Department (HOSPITAL_COMMUNITY)
Admission: EM | Admit: 2015-08-02 | Discharge: 2015-08-02 | Disposition: A | Discharge status: Home / Self Care | Payer: Commercial Managed Care - HMO | Attending: Emergency Medicine | Admitting: Emergency Medicine

## 2015-08-02 ENCOUNTER — Encounter (HOSPITAL_COMMUNITY): Payer: Self-pay | Admitting: Emergency Medicine

## 2015-08-02 DIAGNOSIS — E780 Pure hypercholesterolemia: Secondary | ICD-10-CM | POA: Insufficient documentation

## 2015-08-02 DIAGNOSIS — E785 Hyperlipidemia, unspecified: Secondary | ICD-10-CM | POA: Insufficient documentation

## 2015-08-02 DIAGNOSIS — W2209XA Striking against other stationary object, initial encounter: Secondary | ICD-10-CM | POA: Insufficient documentation

## 2015-08-02 DIAGNOSIS — E119 Type 2 diabetes mellitus without complications: Secondary | ICD-10-CM | POA: Insufficient documentation

## 2015-08-02 DIAGNOSIS — J449 Chronic obstructive pulmonary disease, unspecified: Secondary | ICD-10-CM | POA: Diagnosis not present

## 2015-08-02 DIAGNOSIS — Y929 Unspecified place or not applicable: Secondary | ICD-10-CM | POA: Insufficient documentation

## 2015-08-02 DIAGNOSIS — Z87891 Personal history of nicotine dependence: Secondary | ICD-10-CM | POA: Diagnosis not present

## 2015-08-02 DIAGNOSIS — Y999 Unspecified external cause status: Secondary | ICD-10-CM | POA: Diagnosis not present

## 2015-08-02 DIAGNOSIS — Z7951 Long term (current) use of inhaled steroids: Secondary | ICD-10-CM | POA: Insufficient documentation

## 2015-08-02 DIAGNOSIS — M810 Age-related osteoporosis without current pathological fracture: Secondary | ICD-10-CM | POA: Insufficient documentation

## 2015-08-02 DIAGNOSIS — Z79899 Other long term (current) drug therapy: Secondary | ICD-10-CM | POA: Insufficient documentation

## 2015-08-02 DIAGNOSIS — Y939 Activity, unspecified: Secondary | ICD-10-CM | POA: Insufficient documentation

## 2015-08-02 DIAGNOSIS — I10 Essential (primary) hypertension: Secondary | ICD-10-CM | POA: Diagnosis not present

## 2015-08-02 DIAGNOSIS — S99921A Unspecified injury of right foot, initial encounter: Secondary | ICD-10-CM | POA: Diagnosis present

## 2015-08-02 DIAGNOSIS — S91311A Laceration without foreign body, right foot, initial encounter: Secondary | ICD-10-CM | POA: Diagnosis not present

## 2015-08-02 DIAGNOSIS — S91011A Laceration without foreign body, right ankle, initial encounter: Secondary | ICD-10-CM | POA: Diagnosis not present

## 2015-08-02 DIAGNOSIS — Z7982 Long term (current) use of aspirin: Secondary | ICD-10-CM | POA: Diagnosis not present

## 2015-08-02 MED ORDER — LIDOCAINE HCL 2 % IJ SOLN
10.0000 mL | Freq: Once | INTRAMUSCULAR | Status: AC
  Administered 2015-08-02: 200 mg

## 2015-08-02 NOTE — ED Notes (Signed)
BP elevated. PA instructed to hold pt and recheck in few minutes. Pt and visitor given beverages.

## 2015-08-02 NOTE — ED Provider Notes (Signed)
CSN: [id number]     Arrival date & time 08/02/15  1317 History  This chart was scribed for Alyse Low, [employer], working with Lajean Saver, MD by Steva Colder, ED Scribe. The patient was seen in room TR03C/TR03C at 1:40 PM.    Chief Complaint  Patient presents with  . Laceration      The history is provided by the patient. No language interpreter was used.    Doris Wyatt is a 69 y.o. female with a medical hx of DM and HTN, who presents to the Emergency Department complaining of laceration onset PTA. Pt notes that the storm door at her house caught the right heel of her foot while she was trying to push the other door open with her foot Pt notes that tetanus is UTD. She denies color change, swelling, gait problem, and any other symptoms. Pt PCP is Dr. Drema Dallas at [facility]. Pt denies being on blood thinners or being allergic to any medications.    Past Medical History  Diagnosis Date  . Hypertension   . Hyperlipemia   . Osteoporosis   . COPD (chronic obstructive pulmonary disease)   . Diabetes mellitus   . Hypercholesterolemia    Past Surgical History  Procedure Laterality Date  . Back surgery    . Appendectomy    . Tubal ligation     No family history on file. Social History  Substance Use Topics  . Smoking status: Former Research scientist (life sciences)  . Smokeless tobacco: None  . Alcohol Use: Yes     Comment: occaisionally   OB History    No data available     Review of Systems  Musculoskeletal: Negative for joint swelling and gait problem.  Skin: Positive for wound. Negative for color change.      Allergies  Review of patient's allergies indicates no known allergies.  Home Medications   Prior to Admission medications   Medication Sig Start Date End Date Taking? Authorizing Provider  albuterol (PROVENTIL HFA;VENTOLIN HFA) 108 (90 BASE) MCG/ACT inhaler Inhale 2 puffs into the lungs every 6 (six) hours as needed. For wheeze or shortness of breath    Historical Provider, MD   alendronate (FOSAMAX) 70 MG tablet Take 70 mg by mouth every 7 (seven) days. Take with a full glass of water on an empty stomach. Takes on Tuesday.    Historical Provider, MD  ALPRAZolam Duanne Moron) 0.25 MG tablet Take 0.25 mg by mouth at bedtime as needed for anxiety.    Historical Provider, MD  aspirin 325 MG tablet Take 325 mg by mouth daily.    Historical Provider, MD  atorvastatin (LIPITOR) 40 MG tablet Take 40 mg by mouth daily.    Historical Provider, MD  calcium carbonate (OS-CAL - DOSED IN MG OF ELEMENTAL CALCIUM) 1250 MG tablet Take 1 tablet by mouth 2 (two) times daily with a meal.     Historical Provider, MD  glimepiride (AMARYL) 1 MG tablet Take 1 mg by mouth daily with breakfast.    Historical Provider, MD  ibuprofen (ADVIL,MOTRIN) 600 MG tablet Take 1 tablet (600 mg total) by mouth every 6 (six) hours as needed. 06/17/15   Leonard Schwartz, MD  Menthol-Zinc Oxide (GOLD BOND EX) Apply 1 application topically daily as needed (for feet and hands).    Historical Provider, MD  metFORMIN (GLUCOPHAGE) 1000 MG tablet Take 1,000-1,500 mg by mouth 2 (two) times daily with a meal. Takes 1.5 tabs in am and 1 tab in pm    Historical Provider,  MD  mometasone-formoterol (DULERA) 100-5 MCG/ACT AERO Inhale 2 puffs into the lungs 2 (two) times daily.    Historical Provider, MD  Multiple Vitamin (MULITIVITAMIN WITH MINERALS) TABS Take 1 tablet by mouth daily.    Historical Provider, MD  traMADol (ULTRAM) 50 MG tablet Take 1 tablet (50 mg total) by mouth every 6 (six) hours as needed. 06/17/15   Leonard Schwartz, MD  valsartan (DIOVAN) 80 MG tablet Take 80 mg by mouth daily.    Historical Provider, MD   BP 191/90 mmHg  Pulse 77  Temp(Src) 98 F (36.7 C) (Oral)  Resp 22  SpO2 97% Physical Exam  Constitutional: She is oriented to person, place, and time. She appears well-developed and well-nourished. No distress.  HENT:  Head: Normocephalic and atraumatic.  Eyes: EOM are normal.  Neck: Neck supple. No  tracheal deviation present.  Cardiovascular: Normal rate.   Pulmonary/Chest: Effort normal. No respiratory distress.  Musculoskeletal: Normal range of motion.       Right ankle: She exhibits laceration. She exhibits normal range of motion. Achilles tendon normal. Achilles tendon exhibits normal Thompson's test results.  2 cm laceration to the posterior right ankle. Full ROM achilles intact. Negative thompsons.   Neurological: She is alert and oriented to person, place, and time.  Skin: Skin is warm and dry. Laceration noted.  Psychiatric: She has a normal mood and affect. Her behavior is normal.  Nursing note and vitals reviewed.   ED Course  Procedures (including critical care time) DIAGNOSTIC STUDIES: Oxygen Saturation is 97% on RA, nl by my interpretation.    COORDINATION OF CARE: 1:43 PM Discussed treatment plan with pt at bedside and pt agreed to plan.  LACERATION REPAIR PROCEDURE NOTE The patient's identification was confirmed and consent was obtained. This procedure was performed by Alyse Low, PA-C at 2:06 PM. Site: posterior right ankle Sterile procedures observed: YES Anesthetic used (type and amt): 2 % Lidocaine without Epinephrine and 3 mL used Suture type/size:5-0 Prolene Length: 2 cm # of Sutures: 5 Technique:Single interrupted Complexity: SImple Antibx ointment applied: BACITRICIN  Tetanus UTD or ordered: YES Site anesthetized, irrigated with NS, explored without evidence of foreign body, wound well approximated, site covered with dry, sterile dressing.  Patient tolerated procedure well without complications. Instructions for care discussed verbally and patient provided with additional written instructions for homecare and f/u.   Labs Review Labs Reviewed - No data to display  Imaging Review No results found. Alyse Low, PA-C, have personally reviewed and evaluated these images and lab results as part of my medical decision-making.    EKG  Interpretation None      MDM   Final diagnoses:  Laceration of right heel, initial encounter     avs Laceration care See your MD for recheck of blood pressure.  Hollace Kinnier [place], PA-C 08/02/15 [place], MD 08/02/15 [phone]

## 2015-08-02 NOTE — ED Notes (Signed)
Metal door caught heel of right foot. Laceration to heel.

## 2015-08-02 NOTE — Discharge Instructions (Signed)

## 2015-08-12 DIAGNOSIS — Z4802 Encounter for removal of sutures: Secondary | ICD-10-CM | POA: Diagnosis not present

## 2015-08-12 DIAGNOSIS — T798XXA Other early complications of trauma, initial encounter: Secondary | ICD-10-CM | POA: Diagnosis not present

## 2015-08-12 DIAGNOSIS — T148 Other injury of unspecified body region: Secondary | ICD-10-CM | POA: Diagnosis not present

## 2015-09-24 DIAGNOSIS — E7800 Pure hypercholesterolemia, unspecified: Secondary | ICD-10-CM | POA: Diagnosis not present

## 2015-09-24 DIAGNOSIS — M810 Age-related osteoporosis without current pathological fracture: Secondary | ICD-10-CM | POA: Diagnosis not present

## 2015-09-24 DIAGNOSIS — J449 Chronic obstructive pulmonary disease, unspecified: Secondary | ICD-10-CM | POA: Diagnosis not present

## 2015-09-24 DIAGNOSIS — I10 Essential (primary) hypertension: Secondary | ICD-10-CM | POA: Diagnosis not present

## 2015-09-24 DIAGNOSIS — E119 Type 2 diabetes mellitus without complications: Secondary | ICD-10-CM | POA: Diagnosis not present

## 2015-09-24 DIAGNOSIS — D649 Anemia, unspecified: Secondary | ICD-10-CM | POA: Diagnosis not present

## 2015-10-01 ENCOUNTER — Ambulatory Visit
Admission: RE | Admit: 2015-10-01 | Discharge: 2015-10-01 | Disposition: A | Discharge status: Home / Self Care | Payer: No Typology Code available for payment source | Source: Ambulatory Visit | Attending: Family Medicine | Admitting: Family Medicine

## 2015-10-01 ENCOUNTER — Other Ambulatory Visit: Payer: Self-pay | Admitting: Family Medicine

## 2015-10-01 DIAGNOSIS — H5210 Myopia, unspecified eye: Secondary | ICD-10-CM | POA: Diagnosis not present

## 2015-10-01 DIAGNOSIS — J449 Chronic obstructive pulmonary disease, unspecified: Secondary | ICD-10-CM

## 2015-10-01 DIAGNOSIS — H524 Presbyopia: Secondary | ICD-10-CM | POA: Diagnosis not present

## 2015-10-01 DIAGNOSIS — E118 Type 2 diabetes mellitus with unspecified complications: Secondary | ICD-10-CM | POA: Diagnosis not present

## 2015-10-30 DIAGNOSIS — K5792 Diverticulitis of intestine, part unspecified, without perforation or abscess without bleeding: Secondary | ICD-10-CM | POA: Diagnosis not present

## 2015-12-30 ENCOUNTER — Other Ambulatory Visit: Payer: Self-pay

## 2015-12-30 DIAGNOSIS — Z1231 Encounter for screening mammogram for malignant neoplasm of breast: Secondary | ICD-10-CM

## 2016-01-27 ENCOUNTER — Ambulatory Visit
Admission: RE | Admit: 2016-01-27 | Discharge: 2016-01-27 | Disposition: A | Discharge status: Home / Self Care | Payer: Commercial Managed Care - HMO | Source: Ambulatory Visit

## 2016-01-27 DIAGNOSIS — Z1231 Encounter for screening mammogram for malignant neoplasm of breast: Secondary | ICD-10-CM

## 2016-03-31 DIAGNOSIS — I10 Essential (primary) hypertension: Secondary | ICD-10-CM | POA: Diagnosis not present

## 2016-03-31 DIAGNOSIS — F419 Anxiety disorder, unspecified: Secondary | ICD-10-CM | POA: Diagnosis not present

## 2016-03-31 DIAGNOSIS — Z79899 Other long term (current) drug therapy: Secondary | ICD-10-CM | POA: Diagnosis not present

## 2016-03-31 DIAGNOSIS — E119 Type 2 diabetes mellitus without complications: Secondary | ICD-10-CM | POA: Diagnosis not present

## 2016-03-31 DIAGNOSIS — M810 Age-related osteoporosis without current pathological fracture: Secondary | ICD-10-CM | POA: Diagnosis not present

## 2016-03-31 DIAGNOSIS — J449 Chronic obstructive pulmonary disease, unspecified: Secondary | ICD-10-CM | POA: Diagnosis not present

## 2016-03-31 DIAGNOSIS — Z7984 Long term (current) use of oral hypoglycemic drugs: Secondary | ICD-10-CM | POA: Diagnosis not present

## 2016-03-31 DIAGNOSIS — D649 Anemia, unspecified: Secondary | ICD-10-CM | POA: Diagnosis not present

## 2016-03-31 DIAGNOSIS — E7800 Pure hypercholesterolemia, unspecified: Secondary | ICD-10-CM | POA: Diagnosis not present

## 2016-04-01 DIAGNOSIS — R319 Hematuria, unspecified: Secondary | ICD-10-CM | POA: Diagnosis not present

## 2016-04-02 DIAGNOSIS — R110 Nausea: Secondary | ICD-10-CM | POA: Diagnosis not present

## 2016-04-02 DIAGNOSIS — D3501 Benign neoplasm of right adrenal gland: Secondary | ICD-10-CM | POA: Diagnosis not present

## 2016-04-02 DIAGNOSIS — N200 Calculus of kidney: Secondary | ICD-10-CM | POA: Diagnosis not present

## 2016-04-02 DIAGNOSIS — N23 Unspecified renal colic: Secondary | ICD-10-CM | POA: Diagnosis not present

## 2016-04-02 DIAGNOSIS — R319 Hematuria, unspecified: Secondary | ICD-10-CM | POA: Diagnosis not present

## 2016-04-02 DIAGNOSIS — Z0000 Encounter for general adult medical examination without abnormal findings: Secondary | ICD-10-CM | POA: Diagnosis not present

## 2016-04-02 DIAGNOSIS — Z87442 Personal history of urinary calculi: Secondary | ICD-10-CM | POA: Diagnosis not present

## 2016-05-14 DIAGNOSIS — J441 Chronic obstructive pulmonary disease with (acute) exacerbation: Secondary | ICD-10-CM | POA: Diagnosis not present

## 2016-10-05 DIAGNOSIS — H35033 Hypertensive retinopathy, bilateral: Secondary | ICD-10-CM | POA: Diagnosis not present

## 2016-10-06 DIAGNOSIS — E119 Type 2 diabetes mellitus without complications: Secondary | ICD-10-CM | POA: Diagnosis not present

## 2016-10-06 DIAGNOSIS — J449 Chronic obstructive pulmonary disease, unspecified: Secondary | ICD-10-CM | POA: Diagnosis not present

## 2016-10-06 DIAGNOSIS — M810 Age-related osteoporosis without current pathological fracture: Secondary | ICD-10-CM | POA: Diagnosis not present

## 2016-10-06 DIAGNOSIS — F419 Anxiety disorder, unspecified: Secondary | ICD-10-CM | POA: Diagnosis not present

## 2016-10-06 DIAGNOSIS — D649 Anemia, unspecified: Secondary | ICD-10-CM | POA: Diagnosis not present

## 2016-10-06 DIAGNOSIS — I10 Essential (primary) hypertension: Secondary | ICD-10-CM | POA: Diagnosis not present

## 2016-10-06 DIAGNOSIS — Z79899 Other long term (current) drug therapy: Secondary | ICD-10-CM | POA: Diagnosis not present

## 2016-10-06 DIAGNOSIS — E7800 Pure hypercholesterolemia, unspecified: Secondary | ICD-10-CM | POA: Diagnosis not present

## 2016-10-06 DIAGNOSIS — J45909 Unspecified asthma, uncomplicated: Secondary | ICD-10-CM | POA: Diagnosis not present

## 2016-11-10 ENCOUNTER — Encounter (HOSPITAL_COMMUNITY): Payer: Self-pay | Admitting: Emergency Medicine

## 2016-11-10 ENCOUNTER — Emergency Department (HOSPITAL_COMMUNITY)
Admission: EM | Admit: 2016-11-10 | Discharge: 2016-11-10 | Disposition: A | Discharge status: Home / Self Care | Payer: Commercial Managed Care - HMO | Attending: Emergency Medicine | Admitting: Emergency Medicine

## 2016-11-10 DIAGNOSIS — Z7984 Long term (current) use of oral hypoglycemic drugs: Secondary | ICD-10-CM | POA: Insufficient documentation

## 2016-11-10 DIAGNOSIS — W06XXXA Fall from bed, initial encounter: Secondary | ICD-10-CM | POA: Insufficient documentation

## 2016-11-10 DIAGNOSIS — Z87891 Personal history of nicotine dependence: Secondary | ICD-10-CM | POA: Diagnosis not present

## 2016-11-10 DIAGNOSIS — I10 Essential (primary) hypertension: Secondary | ICD-10-CM | POA: Insufficient documentation

## 2016-11-10 DIAGNOSIS — Y929 Unspecified place or not applicable: Secondary | ICD-10-CM | POA: Insufficient documentation

## 2016-11-10 DIAGNOSIS — J449 Chronic obstructive pulmonary disease, unspecified: Secondary | ICD-10-CM | POA: Insufficient documentation

## 2016-11-10 DIAGNOSIS — S0181XA Laceration without foreign body of other part of head, initial encounter: Secondary | ICD-10-CM | POA: Diagnosis present

## 2016-11-10 DIAGNOSIS — Y999 Unspecified external cause status: Secondary | ICD-10-CM | POA: Insufficient documentation

## 2016-11-10 DIAGNOSIS — S098XXA Other specified injuries of head, initial encounter: Secondary | ICD-10-CM | POA: Diagnosis not present

## 2016-11-10 DIAGNOSIS — Z7982 Long term (current) use of aspirin: Secondary | ICD-10-CM | POA: Diagnosis not present

## 2016-11-10 DIAGNOSIS — S0101XA Laceration without foreign body of scalp, initial encounter: Secondary | ICD-10-CM | POA: Insufficient documentation

## 2016-11-10 DIAGNOSIS — Y939 Activity, unspecified: Secondary | ICD-10-CM | POA: Insufficient documentation

## 2016-11-10 DIAGNOSIS — Z79899 Other long term (current) drug therapy: Secondary | ICD-10-CM | POA: Diagnosis not present

## 2016-11-10 DIAGNOSIS — E119 Type 2 diabetes mellitus without complications: Secondary | ICD-10-CM | POA: Insufficient documentation

## 2016-11-10 DIAGNOSIS — S0993XA Unspecified injury of face, initial encounter: Secondary | ICD-10-CM | POA: Diagnosis not present

## 2016-11-10 NOTE — ED Triage Notes (Signed)
Patient here from home with complaints of fall this morning. Reports that she tripped over something beside her bed. Left side head laceration. Headache 6/10.

## 2016-11-10 NOTE — ED Notes (Signed)
Bed: [id number] Expected date:  Expected time:  Means of arrival:  Comments: EMS-[id number] fall, laceration to head

## 2016-11-10 NOTE — ED Provider Notes (Signed)
[facility] DEPT Provider Note   CSN: [id number] Arrival date & time: 11/10/16  0754     History   Chief Complaint Chief Complaint  Patient presents with  . Fall  . Head Laceration    HPI Doris Wyatt is a 70 y.o. female.  HPI Presents to emergency department complaining of a fall from bed this morning resulting in laceration to left side of head without active bleeding at this time. Complains of HA without neck pain at this time. Pain is 6/10. Pt reports poor sleep lately secondary to increasing muscle aches and pains without fever. Denies weakness of her arms     Past Medical History:  Diagnosis Date  . COPD (chronic obstructive pulmonary disease) ([place])   . Diabetes mellitus   . Hypercholesterolemia   . Hyperlipemia   . Hypertension   . Osteoporosis     There are no active problems to display for this patient.   Past Surgical History:  Procedure Laterality Date  . APPENDECTOMY    . BACK SURGERY    . TUBAL LIGATION      OB History    No data available       Home Medications    Prior to Admission medications   Medication Sig Start Date End Date Taking? Authorizing Provider  albuterol (PROVENTIL HFA;VENTOLIN HFA) 108 (90 BASE) MCG/ACT inhaler Inhale 2 puffs into the lungs every 6 (six) hours as needed. For wheeze or shortness of breath   Yes Historical Provider, MD  alendronate (FOSAMAX) 70 MG tablet Take 70 mg by mouth every 7 (seven) days. Take with a full glass of water on an empty stomach. Takes on Tuesday.   Yes Historical Provider, MD  ALPRAZolam Duanne Moron) 0.25 MG tablet Take 0.25 mg by mouth at bedtime as needed for anxiety.   Yes Historical Provider, MD  aspirin 325 MG tablet Take 325 mg by mouth daily.   Yes Historical Provider, MD  atorvastatin (LIPITOR) 40 MG tablet Take 40 mg by mouth every evening.    Yes Historical Provider, MD  calcium carbonate (OS-CAL - DOSED IN MG OF ELEMENTAL CALCIUM) 1250 MG tablet Take 1 tablet by mouth daily  with breakfast.    Yes Historical Provider, MD  Menthol-Zinc Oxide (GOLD BOND EX) Apply 1 application topically daily as needed (for feet and hands).   Yes Historical Provider, MD  metFORMIN (GLUCOPHAGE) 1000 MG tablet Take 1,000-1,500 mg by mouth 2 (two) times daily with a meal. Takes 1.5 tabs in am and 1 tab in pm   Yes Historical Provider, MD  mometasone-formoterol (DULERA) 100-5 MCG/ACT AERO Inhale 2 puffs into the lungs 2 (two) times daily.   Yes Historical Provider, MD  Multiple Vitamin (MULITIVITAMIN WITH MINERALS) TABS Take 1 tablet by mouth daily.   Yes Historical Provider, MD  valsartan (DIOVAN) 80 MG tablet Take 80 mg by mouth daily.   Yes Historical Provider, MD  glimepiride (AMARYL) 2 MG tablet Take 2 mg by mouth daily. 10/16/16   Historical Provider, MD    Family History No family history on file.  Social History Social History  Substance Use Topics  . Smoking status: Former Research scientist (life sciences)  . Smokeless tobacco: Never Used  . Alcohol use Yes     Comment: occaisionally     Allergies   Patient has no known allergies.   Review of Systems Review of Systems  All other systems reviewed and are negative.    Physical Exam Updated Vital Signs BP 143/78 (BP Location: Right  Arm)   Pulse 79   Temp 98.4 F (36.9 C) (Oral)   Resp 18   SpO2 95%   Physical Exam  Constitutional: She is oriented to person, place, and time. She appears well-developed and well-nourished.  HENT:  Head: Normocephalic.  Superficial left parietal scalp laceration without bleeding. No associated hematoma  Eyes: EOM are normal.  Neck: Normal range of motion. Neck supple.  c spine nontender  Pulmonary/Chest: Effort normal.  Abdominal: She exhibits no distension.  Musculoskeletal: Normal range of motion.  Neurological: She is alert and oriented to person, place, and time.  Psychiatric: She has a normal mood and affect.  Nursing note and vitals reviewed.    ED Treatments / Results  Labs (all labs  ordered are listed, but only abnormal results are displayed) Labs Reviewed - No data to display  EKG  EKG Interpretation None       Radiology No results found.  Procedures .Marland KitchenLaceration Repair Performed by: Jola Schmidt Authorized by: Jola Schmidt    Risks and benefits: risks, benefits and alternatives were discussed Patient identity confirmed: provided demographic data Time out performed prior to procedure Prepped and Draped in normal sterile fashion Wound explored Laceration Location: scalp Laceration Length: 1.5cm No Foreign Bodies seen or palpated Anesthesia: none Irrigation method: syringe Amount of cleaning: standard Skin closure: staple Number of sutures or staples: 3 Technique: staple Patient tolerance: Patient tolerated the procedure well with no immediate complications.   Medications Ordered in ED Medications - No data to display   Initial Impression / Assessment and Plan / ED Course  I have reviewed the triage vital signs and the nursing notes.  Pertinent labs & imaging results that were available during my care of the patient were reviewed by me and considered in my medical decision making (see chart for details).  Clinical Course    Minor head injury with laceration. c spine nontender. Staples for repair. Infection and head injury warnings given to patient granddaughter  Final Clinical Impressions(s) / ED Diagnoses   Final diagnoses:  None    New Prescriptions New Prescriptions   No medications on file     Jola Schmidt, MD 11/10/16 0830

## 2016-11-20 DIAGNOSIS — J441 Chronic obstructive pulmonary disease with (acute) exacerbation: Secondary | ICD-10-CM | POA: Diagnosis not present

## 2016-11-20 DIAGNOSIS — S0101XA Laceration without foreign body of scalp, initial encounter: Secondary | ICD-10-CM | POA: Diagnosis not present

## 2017-01-15 ENCOUNTER — Other Ambulatory Visit: Payer: Self-pay | Admitting: Family Medicine

## 2017-01-15 DIAGNOSIS — Z1231 Encounter for screening mammogram for malignant neoplasm of breast: Secondary | ICD-10-CM

## 2017-02-02 ENCOUNTER — Ambulatory Visit
Admission: RE | Admit: 2017-02-02 | Discharge: 2017-02-02 | Disposition: A | Discharge status: Home / Self Care | Payer: Commercial Managed Care - HMO | Source: Ambulatory Visit | Attending: Family Medicine | Admitting: Family Medicine

## 2017-02-02 DIAGNOSIS — Z1231 Encounter for screening mammogram for malignant neoplasm of breast: Secondary | ICD-10-CM | POA: Diagnosis not present

## 2017-04-06 DIAGNOSIS — J209 Acute bronchitis, unspecified: Secondary | ICD-10-CM | POA: Diagnosis not present

## 2017-04-06 DIAGNOSIS — J441 Chronic obstructive pulmonary disease with (acute) exacerbation: Secondary | ICD-10-CM | POA: Diagnosis not present

## 2017-04-06 DIAGNOSIS — Z7984 Long term (current) use of oral hypoglycemic drugs: Secondary | ICD-10-CM | POA: Diagnosis not present

## 2017-04-06 DIAGNOSIS — F419 Anxiety disorder, unspecified: Secondary | ICD-10-CM | POA: Diagnosis not present

## 2017-04-06 DIAGNOSIS — D649 Anemia, unspecified: Secondary | ICD-10-CM | POA: Diagnosis not present

## 2017-04-06 DIAGNOSIS — J449 Chronic obstructive pulmonary disease, unspecified: Secondary | ICD-10-CM | POA: Diagnosis not present

## 2017-04-06 DIAGNOSIS — E7800 Pure hypercholesterolemia, unspecified: Secondary | ICD-10-CM | POA: Diagnosis not present

## 2017-04-06 DIAGNOSIS — I10 Essential (primary) hypertension: Secondary | ICD-10-CM | POA: Diagnosis not present

## 2017-04-06 DIAGNOSIS — E119 Type 2 diabetes mellitus without complications: Secondary | ICD-10-CM | POA: Diagnosis not present

## 2017-06-10 DIAGNOSIS — M5090 Cervical disc disorder, unspecified, unspecified cervical region: Secondary | ICD-10-CM | POA: Diagnosis not present

## 2017-06-10 DIAGNOSIS — M79601 Pain in right arm: Secondary | ICD-10-CM | POA: Diagnosis not present

## 2017-07-05 DIAGNOSIS — M542 Cervicalgia: Secondary | ICD-10-CM | POA: Diagnosis not present

## 2017-07-05 DIAGNOSIS — M5032 Other cervical disc degeneration, mid-cervical region, unspecified level: Secondary | ICD-10-CM | POA: Diagnosis not present

## 2017-07-20 DIAGNOSIS — M542 Cervicalgia: Secondary | ICD-10-CM | POA: Diagnosis not present

## 2017-08-23 DIAGNOSIS — W19XXXA Unspecified fall, initial encounter: Secondary | ICD-10-CM | POA: Diagnosis not present

## 2017-08-23 DIAGNOSIS — S20211A Contusion of right front wall of thorax, initial encounter: Secondary | ICD-10-CM | POA: Diagnosis not present

## 2017-10-07 DIAGNOSIS — Z23 Encounter for immunization: Secondary | ICD-10-CM | POA: Diagnosis not present

## 2017-10-07 DIAGNOSIS — D649 Anemia, unspecified: Secondary | ICD-10-CM | POA: Diagnosis not present

## 2017-10-07 DIAGNOSIS — J449 Chronic obstructive pulmonary disease, unspecified: Secondary | ICD-10-CM | POA: Diagnosis not present

## 2017-10-07 DIAGNOSIS — Z7984 Long term (current) use of oral hypoglycemic drugs: Secondary | ICD-10-CM | POA: Diagnosis not present

## 2017-10-07 DIAGNOSIS — E7800 Pure hypercholesterolemia, unspecified: Secondary | ICD-10-CM | POA: Diagnosis not present

## 2017-10-07 DIAGNOSIS — F419 Anxiety disorder, unspecified: Secondary | ICD-10-CM | POA: Diagnosis not present

## 2017-10-07 DIAGNOSIS — I10 Essential (primary) hypertension: Secondary | ICD-10-CM | POA: Diagnosis not present

## 2017-10-07 DIAGNOSIS — E119 Type 2 diabetes mellitus without complications: Secondary | ICD-10-CM | POA: Diagnosis not present

## 2017-10-11 DIAGNOSIS — H524 Presbyopia: Secondary | ICD-10-CM | POA: Diagnosis not present

## 2017-10-11 DIAGNOSIS — E7800 Pure hypercholesterolemia, unspecified: Secondary | ICD-10-CM | POA: Diagnosis not present

## 2017-10-11 DIAGNOSIS — E118 Type 2 diabetes mellitus with unspecified complications: Secondary | ICD-10-CM | POA: Diagnosis not present

## 2017-10-13 ENCOUNTER — Emergency Department (HOSPITAL_COMMUNITY)
Admission: EM | Admit: 2017-10-13 | Discharge: 2017-10-13 | Disposition: A | Discharge status: Home / Self Care | Payer: Commercial Managed Care - HMO | Attending: Emergency Medicine | Admitting: Emergency Medicine

## 2017-10-13 ENCOUNTER — Encounter (HOSPITAL_COMMUNITY): Payer: Self-pay | Admitting: Emergency Medicine

## 2017-10-13 DIAGNOSIS — E119 Type 2 diabetes mellitus without complications: Secondary | ICD-10-CM | POA: Insufficient documentation

## 2017-10-13 DIAGNOSIS — R8290 Unspecified abnormal findings in urine: Secondary | ICD-10-CM | POA: Diagnosis not present

## 2017-10-13 DIAGNOSIS — R1903 Right lower quadrant abdominal swelling, mass and lump: Secondary | ICD-10-CM | POA: Diagnosis not present

## 2017-10-13 DIAGNOSIS — Z79899 Other long term (current) drug therapy: Secondary | ICD-10-CM | POA: Diagnosis not present

## 2017-10-13 DIAGNOSIS — Z87891 Personal history of nicotine dependence: Secondary | ICD-10-CM | POA: Insufficient documentation

## 2017-10-13 DIAGNOSIS — R109 Unspecified abdominal pain: Secondary | ICD-10-CM | POA: Diagnosis present

## 2017-10-13 DIAGNOSIS — K5792 Diverticulitis of intestine, part unspecified, without perforation or abscess without bleeding: Secondary | ICD-10-CM | POA: Insufficient documentation

## 2017-10-13 DIAGNOSIS — K5732 Diverticulitis of large intestine without perforation or abscess without bleeding: Secondary | ICD-10-CM | POA: Diagnosis not present

## 2017-10-13 DIAGNOSIS — Z7984 Long term (current) use of oral hypoglycemic drugs: Secondary | ICD-10-CM | POA: Insufficient documentation

## 2017-10-13 DIAGNOSIS — J449 Chronic obstructive pulmonary disease, unspecified: Secondary | ICD-10-CM | POA: Insufficient documentation

## 2017-10-13 DIAGNOSIS — Z7982 Long term (current) use of aspirin: Secondary | ICD-10-CM | POA: Diagnosis not present

## 2017-10-13 DIAGNOSIS — I10 Essential (primary) hypertension: Secondary | ICD-10-CM | POA: Insufficient documentation

## 2017-10-13 DIAGNOSIS — R1032 Left lower quadrant pain: Secondary | ICD-10-CM | POA: Diagnosis not present

## 2017-10-13 LAB — CBG MONITORING, ED: GLUCOSE-CAPILLARY: 85 mg/dL (ref 65–99)

## 2017-10-13 MED ORDER — CIPROFLOXACIN HCL 500 MG PO TABS: 500.0000 mg | ORAL_TABLET | Freq: Two times a day (BID) | ORAL | 0 refills | Status: DC

## 2017-10-13 MED ORDER — METRONIDAZOLE 500 MG PO TABS
500.0000 mg | ORAL_TABLET | Freq: Once | ORAL | Status: AC
  Administered 2017-10-13: 500 mg via ORAL
  Filled 2017-10-13: qty 1

## 2017-10-13 MED ORDER — METRONIDAZOLE IN NACL 5-0.79 MG/ML-% IV SOLN
500.0000 mg | Freq: Once | INTRAVENOUS | Status: DC
  Filled 2017-10-13: qty 100

## 2017-10-13 MED ORDER — CIPROFLOXACIN IN D5W 400 MG/200ML IV SOLN
400.0000 mg | Freq: Once | INTRAVENOUS | Status: AC
  Administered 2017-10-13: 400 mg via INTRAVENOUS
  Filled 2017-10-13: qty 200

## 2017-10-13 MED ORDER — METRONIDAZOLE 500 MG PO TABS: 500.0000 mg | ORAL_TABLET | Freq: Two times a day (BID) | ORAL | 0 refills | Status: DC

## 2017-10-13 MED ORDER — ONDANSETRON 4 MG PO TBDP: 4.0000 mg | ORAL_TABLET | Freq: Three times a day (TID) | ORAL | 0 refills | Status: DC | PRN

## 2017-10-13 MED ORDER — HYDROCODONE-ACETAMINOPHEN 5-325 MG PO TABS
1.0000 | ORAL_TABLET | Freq: Once | ORAL | Status: AC
  Administered 2017-10-13: 1 via ORAL
  Filled 2017-10-13: qty 1

## 2017-10-13 NOTE — ED Notes (Signed)
ED Provider at bedside. Aware of delay

## 2017-10-13 NOTE — ED Provider Notes (Addendum)
[facility] DEPT Provider Note   CSN: [id number] Arrival date & time: 10/13/17  1334     History   Chief Complaint Chief Complaint  Patient presents with  . Abdominal Pain    HPI Doris Wyatt is a 71 y.o. female.  HPI  Patient with a history of COPD, diabetes, hypertension and hyperlipidemia comes in with chief complaint of abdominal pain. patient also has history of diverticulosis.  Patient reports that her abdominal pain started 2 days ago, however it got worse last night.  Pain is located primarily on the left side and in the suprapubic abdominal region. Pain is similar to patient's pain from diverticulitis that she has had in the past (not recent).  Patient also has history of kidney stones, and at some point she had some back pain but currently she has no back pain.  Patient denies any nausea, vomiting, fevers, chills, diaphoresis, bloody stools.  Patient was seen by her primary care doctor earlier today.  Patient reported that her abdominal pain was severe last night, and also discussed that her mother had passed away from diverticulitis complication in her 67E.  Patient was given the option of antibiotics versus coming into the ER for a CT scan, and patient preferred the latter.  Past Medical History:  Diagnosis Date  . COPD (chronic obstructive pulmonary disease) ([place])   . Diabetes mellitus   . Hypercholesterolemia   . Hyperlipemia   . Hypertension   . Osteoporosis     There are no active problems to display for this patient.   Past Surgical History:  Procedure Laterality Date  . APPENDECTOMY    . BACK SURGERY    . TUBAL LIGATION      OB History    No data available       Home Medications    Prior to Admission medications   Medication Sig Start Date End Date Taking? Authorizing Provider  albuterol (PROVENTIL HFA;VENTOLIN HFA) 108 (90 BASE) MCG/ACT inhaler Inhale 2 puffs into the lungs every 6 (six) hours as needed.  For wheeze or shortness of breath   Yes [provider]  alendronate (FOSAMAX) 70 MG tablet Take 70 mg by mouth every 7 (seven) days. Take with a full glass of water on an empty stomach. Takes on Tuesday.   Yes [provider]  ALPRAZolam (XANAX) 0.25 MG tablet Take 0.25 mg by mouth at bedtime as needed for anxiety.   Yes [provider]  aspirin 325 MG tablet Take 325 mg by mouth daily.   Yes [provider]  atorvastatin (LIPITOR) 40 MG tablet Take 40 mg by mouth every evening.    Yes [provider]  Calcium Carbonate-Vitamin D (CALCIUM 500/D PO) Take 1 tablet 4 (four) times daily by mouth.    Yes [provider]  Fluticasone-Salmeterol (ADVAIR) 100-50 MCG/DOSE AEPB Inhale 1 puff 2 (two) times daily into the lungs.   Yes [provider]  glimepiride (AMARYL) 2 MG tablet Take 2 mg by mouth daily. 10/16/16  Yes [provider]  HYDROcodone-acetaminophen (NORCO/VICODIN) 5-325 MG tablet Take 1 tablet every 6 (six) hours by mouth. 08/25/17  Yes [provider]  Menthol-Zinc Oxide (GOLD BOND EX) Apply 1 application topically daily as needed (for feet and hands).   Yes [provider]  metFORMIN (GLUCOPHAGE) 1000 MG tablet Take 1,000-1,500 mg by mouth 2 (two) times daily with a meal. Takes 1.5 tabs= 1500mg  in am and 1 tab=1000mg  in pm  Yes [provider]  Multiple Vitamin (MULITIVITAMIN WITH MINERALS) TABS Take 1 tablet by mouth daily.   Yes [provider]  valsartan (DIOVAN) 80 MG tablet Take 80 mg by mouth daily.   Yes [provider]  ciprofloxacin (CIPRO) 500 MG tablet Take 1 tablet (500 mg total) every 12 (twelve) hours by mouth. 10/13/17   Varney Biles, MD  metroNIDAZOLE (FLAGYL) 500 MG tablet Take 1 tablet (500 mg total) 2 (two) times daily by mouth. 10/13/17   Kathrynn Humble, Lavante Toso, MD  ondansetron (ZOFRAN ODT) 4 MG disintegrating tablet Take 1 tablet (4 mg total) every 8 (eight)  hours as needed by mouth for nausea or vomiting. 10/13/17   Varney Biles, MD    Family History No family history on file.  Social History Social History   Tobacco Use  . Smoking status: Former Research scientist (life sciences)  . Smokeless tobacco: Never Used  Substance Use Topics  . Alcohol use: Yes    Comment: occaisionally  . Drug use: No     Allergies   Patient has no known allergies.   Review of Systems Review of Systems  Constitutional: Negative for chills and fever.  Respiratory: Negative for shortness of breath.   Cardiovascular: Negative for chest pain.  Gastrointestinal: Positive for abdominal pain. Negative for nausea and vomiting.  Genitourinary: Negative for dysuria.  Musculoskeletal: Positive for back pain.  Allergic/Immunologic: Negative for immunocompromised state.  Neurological: Negative for tremors and weakness.     Physical Exam Updated Vital Signs BP (!) 147/70 (BP Location: Left Arm)   Pulse 81   Temp 98.1 F (36.7 C) (Oral)   Resp 18   Ht 5\' 6"  (1.676 m)   Wt 58.5 kg (129 lb)   SpO2 90%   BMI 20.82 kg/m   Physical Exam  Constitutional: She is oriented to person, place, and time. She appears well-developed.  HENT:  Head: Normocephalic and atraumatic.  Eyes: EOM are normal.  Neck: Normal range of motion. Neck supple.  Cardiovascular: Normal rate.  Pulmonary/Chest: Effort normal.  Abdominal: Soft. Normal appearance. She exhibits no pulsatile midline mass. There is tenderness in the suprapubic area and left lower quadrant. There is guarding. There is no rebound.  Focal LLQ tenderness, at the level of the umbilicus  Neurological: She is alert and oriented to person, place, and time.  Skin: Skin is warm and dry.  Nursing note and vitals reviewed.    ED Treatments / Results  Labs (all labs ordered are listed, but only abnormal results are displayed) Labs Reviewed  CBG MONITORING, ED    EKG  EKG Interpretation None       Radiology No results  found.  Procedures Procedures (including critical care time)  Medications Ordered in ED Medications  HYDROcodone-acetaminophen (NORCO/VICODIN) 5-325 MG per tablet 1 tablet (1 tablet Oral Given 10/13/17 1807)  ciprofloxacin (CIPRO) IVPB 400 mg (0 mg Intravenous Stopped 10/13/17 1906)  metroNIDAZOLE (FLAGYL) tablet 500 mg (500 mg Oral Given 10/13/17 1955)     Initial Impression / Assessment and Plan / ED Course  I have reviewed the triage vital signs and the nursing notes.  Pertinent labs & imaging results that were available during my care of the patient were reviewed by me and considered in my medical decision making (see chart for details).  Clinical Course as of Oct 14 2019  Wed Oct 13, 2017  2019 Repeat exam is unchanged. Strict return precautions have been discussed again with the patient and her family.  [AN]  Clinical Course User Index [AN] Varney Biles, MD    Patient comes in with chief complaint of abdominal pain. Patient has history of diverticulosis and was seen by her primary care doctor today.  She reports that she was asked to get a CT scan and therefore she came to the emergency room.  Patient has history of diverticulitis and reports that her pain is similar.  She also has history of kidney stone, however does not have significant flank tenderness at this time.  Review of system is negative for nausea, fevers, chills, weakness, bloody stools.  Patient's lab work from the PCP clinic shows a white count of 11 and her blood glucose here is 85.  Constitutionals on ROS again are negative for any systemic progression and the abd exam reveals guarding w/o tenderness and pt ambulated w/o limping.  It took 3 hours before patient was seen, and she was slightly frustrated. We discussed the option of conservative care with clinical diagnosis, antibiotics and strict return precautions versus aggressive option of getting a CT scan with a definitive diagnosis. Pt preferred the  latter.  I discussed the care with Dr. Drema Dallas.  Dr. Drema Dallas informed me that she would prefer a CT scan given how concerned patient was in the clinic and the guarding on exam.  I went back and discussed the discussion I had with Dr. Drema Dallas again with the patient.  She reported to me that she feels a lot at ease at this time and her pain is significantly better than it was last night, therefore she is comfortable with antibiotics and strict return precautions.  Patient will be given IV antibiotics in the ER. P.o. challenge initiated. She will return to the ER if her pain gets worse, she has nausea, fevers, vomiting, chills, sweats, bloody stools. Patient lives by herself, but has family nearby who can check on her and she is comfortable calling 911 if her symptoms get worse.   Final Clinical Impressions(s) / ED Diagnoses   Final diagnoses:  Acute diverticulitis    ED Discharge Orders        Ordered    ciprofloxacin (CIPRO) 500 MG tablet  Every 12 hours     10/13/17 2018    metroNIDAZOLE (FLAGYL) 500 MG tablet  2 times daily     10/13/17 2018    ondansetron (ZOFRAN ODT) 4 MG disintegrating tablet  Every 8 hours PRN     10/13/17 2018       Varney Biles, MD 10/13/17 Cindy Hazy, MD 10/13/17 2020

## 2017-10-13 NOTE — Discharge Instructions (Signed)
We suspect that you are having uncomplicated diverticulitis. Please read the instructions provided on diverticulitis.  Return to the emergency room if your pain gets severe, you start having fevers, chills, sweats, bloody stools. Please finish the antibiotic course in its entirety, see your doctor in 1 week.

## 2017-10-13 NOTE — ED Triage Notes (Signed)
The nurse at Dr. Drema Dallas [facility]) phones to tell us they are sending pt. Here with symptomology of abd. Pain. They have performed labs which they will send with pt. Also.

## 2017-10-13 NOTE — ED Triage Notes (Addendum)
Patient was sent by [facility] for CT scan suspecting the patient has diverticulitis. Pt had blood work showing slightly elevated WBC. C/o left lower quadrant pain.   No standing orders placed on patient due to lab work and urine test completed at [place] office.

## 2017-10-13 NOTE — ED Notes (Signed)
Pt ambulatory and independent at discharge.  Verbalized understanding of discharge instructions 

## 2017-10-31 ENCOUNTER — Inpatient Hospital Stay (HOSPITAL_COMMUNITY): Payer: Medicare HMO

## 2017-10-31 ENCOUNTER — Encounter (HOSPITAL_COMMUNITY): Payer: Self-pay | Admitting: Emergency Medicine

## 2017-10-31 ENCOUNTER — Emergency Department (HOSPITAL_COMMUNITY): Payer: Medicare HMO

## 2017-10-31 ENCOUNTER — Inpatient Hospital Stay (HOSPITAL_COMMUNITY)
Admission: EM | Admit: 2017-10-31 | Discharge: 2017-11-04 | DRG: 660 | Disposition: A | Discharge status: Home / Self Care | Payer: Medicare HMO | Attending: Nephrology | Admitting: Nephrology

## 2017-10-31 ENCOUNTER — Inpatient Hospital Stay (HOSPITAL_COMMUNITY): Payer: Medicare HMO | Admitting: Anesthesiology

## 2017-10-31 ENCOUNTER — Other Ambulatory Visit: Payer: Self-pay

## 2017-10-31 ENCOUNTER — Encounter (HOSPITAL_COMMUNITY)
Admission: EM | Disposition: A | Discharge status: Home / Self Care | Payer: Self-pay | Source: Home / Self Care | Attending: Internal Medicine

## 2017-10-31 DIAGNOSIS — J449 Chronic obstructive pulmonary disease, unspecified: Secondary | ICD-10-CM | POA: Diagnosis not present

## 2017-10-31 DIAGNOSIS — E119 Type 2 diabetes mellitus without complications: Secondary | ICD-10-CM | POA: Diagnosis not present

## 2017-10-31 DIAGNOSIS — K5720 Diverticulitis of large intestine with perforation and abscess without bleeding: Secondary | ICD-10-CM | POA: Diagnosis not present

## 2017-10-31 DIAGNOSIS — Z79899 Other long term (current) drug therapy: Secondary | ICD-10-CM

## 2017-10-31 DIAGNOSIS — Z7984 Long term (current) use of oral hypoglycemic drugs: Secondary | ICD-10-CM

## 2017-10-31 DIAGNOSIS — E7800 Pure hypercholesterolemia, unspecified: Secondary | ICD-10-CM | POA: Diagnosis present

## 2017-10-31 DIAGNOSIS — Z7983 Long term (current) use of bisphosphonates: Secondary | ICD-10-CM

## 2017-10-31 DIAGNOSIS — K5732 Diverticulitis of large intestine without perforation or abscess without bleeding: Secondary | ICD-10-CM

## 2017-10-31 DIAGNOSIS — R1032 Left lower quadrant pain: Secondary | ICD-10-CM

## 2017-10-31 DIAGNOSIS — E785 Hyperlipidemia, unspecified: Secondary | ICD-10-CM

## 2017-10-31 DIAGNOSIS — N132 Hydronephrosis with renal and ureteral calculous obstruction: Secondary | ICD-10-CM

## 2017-10-31 DIAGNOSIS — N136 Pyonephrosis: Principal | ICD-10-CM | POA: Diagnosis present

## 2017-10-31 DIAGNOSIS — E1169 Type 2 diabetes mellitus with other specified complication: Secondary | ICD-10-CM | POA: Diagnosis present

## 2017-10-31 DIAGNOSIS — N201 Calculus of ureter: Secondary | ICD-10-CM | POA: Diagnosis not present

## 2017-10-31 DIAGNOSIS — Z87442 Personal history of urinary calculi: Secondary | ICD-10-CM

## 2017-10-31 DIAGNOSIS — I10 Essential (primary) hypertension: Secondary | ICD-10-CM

## 2017-10-31 DIAGNOSIS — Z87891 Personal history of nicotine dependence: Secondary | ICD-10-CM

## 2017-10-31 DIAGNOSIS — E138 Other specified diabetes mellitus with unspecified complications: Secondary | ICD-10-CM

## 2017-10-31 DIAGNOSIS — Z7982 Long term (current) use of aspirin: Secondary | ICD-10-CM | POA: Diagnosis not present

## 2017-10-31 DIAGNOSIS — R109 Unspecified abdominal pain: Secondary | ICD-10-CM | POA: Diagnosis not present

## 2017-10-31 DIAGNOSIS — M810 Age-related osteoporosis without current pathological fracture: Secondary | ICD-10-CM | POA: Diagnosis present

## 2017-10-31 DIAGNOSIS — E1159 Type 2 diabetes mellitus with other circulatory complications: Secondary | ICD-10-CM | POA: Diagnosis present

## 2017-10-31 DIAGNOSIS — N202 Calculus of kidney with calculus of ureter: Secondary | ICD-10-CM | POA: Diagnosis not present

## 2017-10-31 HISTORY — PX: CYSTOSCOPY W/ URETERAL STENT PLACEMENT: SHX1429

## 2017-10-31 HISTORY — DX: Urinary calculus, unspecified: N20.9

## 2017-10-31 HISTORY — DX: Hydronephrosis with renal and ureteral calculous obstruction: N13.2

## 2017-10-31 HISTORY — DX: Left lower quadrant pain: R10.32

## 2017-10-31 LAB — URINALYSIS, ROUTINE W REFLEX MICROSCOPIC
BILIRUBIN URINE: NEGATIVE
Glucose, UA: 50 mg/dL — AB
KETONES UR: NEGATIVE mg/dL
NITRITE: NEGATIVE
PROTEIN: NEGATIVE mg/dL
Specific Gravity, Urine: 1.028 (ref 1.005–1.030)
pH: 5 (ref 5.0–8.0)

## 2017-10-31 LAB — DIFFERENTIAL
BASOS ABS: 0 10*3/uL (ref 0.0–0.1)
BASOS PCT: 0 %
EOS ABS: 0.2 10*3/uL (ref 0.0–0.7)
Eosinophils Relative: 1 %
Lymphocytes Relative: 9 %
Lymphs Abs: 2 10*3/uL (ref 0.7–4.0)
Monocytes Absolute: 1.7 10*3/uL — ABNORMAL HIGH (ref 0.1–1.0)
Monocytes Relative: 8 %
NEUTROS PCT: 82 %
Neutro Abs: 18.3 10*3/uL — ABNORMAL HIGH (ref 1.7–7.7)

## 2017-10-31 LAB — CBC
HCT: 39.7 % (ref 36.0–46.0)
Hemoglobin: 13.2 g/dL (ref 12.0–15.0)
MCH: 31.1 pg (ref 26.0–34.0)
MCHC: 33.2 g/dL (ref 30.0–36.0)
MCV: 93.6 fL (ref 78.0–100.0)
Platelets: 300 10*3/uL (ref 150–400)
RBC: 4.24 MIL/uL (ref 3.87–5.11)
RDW: 13.7 % (ref 11.5–15.5)
WBC: 22.3 10*3/uL — ABNORMAL HIGH (ref 4.0–10.5)

## 2017-10-31 LAB — COMPREHENSIVE METABOLIC PANEL
ALK PHOS: 41 U/L (ref 38–126)
ALT: 20 U/L (ref 14–54)
AST: 24 U/L (ref 15–41)
Albumin: 4.1 g/dL (ref 3.5–5.0)
Anion gap: 10 (ref 5–15)
BUN: 16 mg/dL (ref 6–20)
CALCIUM: 9.6 mg/dL (ref 8.9–10.3)
CO2: 25 mmol/L (ref 22–32)
CREATININE: 0.55 mg/dL (ref 0.44–1.00)
Chloride: 101 mmol/L (ref 101–111)
Glucose, Bld: 212 mg/dL — ABNORMAL HIGH (ref 65–99)
Potassium: 3.8 mmol/L (ref 3.5–5.1)
SODIUM: 136 mmol/L (ref 135–145)
Total Bilirubin: 0.8 mg/dL (ref 0.3–1.2)
Total Protein: 7 g/dL (ref 6.5–8.1)

## 2017-10-31 LAB — GLUCOSE, CAPILLARY
Glucose-Capillary: 124 mg/dL — ABNORMAL HIGH (ref 65–99)
Glucose-Capillary: 194 mg/dL — ABNORMAL HIGH (ref 65–99)

## 2017-10-31 LAB — LIPASE, BLOOD: Lipase: 180 U/L — ABNORMAL HIGH (ref 11–51)

## 2017-10-31 SURGERY — CYSTOSCOPY, WITH RETROGRADE PYELOGRAM AND URETERAL STENT INSERTION
Anesthesia: General | Site: Ureter | Laterality: Left

## 2017-10-31 MED ORDER — OXYCODONE HCL 5 MG/5ML PO SOLN
5.0000 mg | Freq: Once | ORAL | Status: AC | PRN
  Filled 2017-10-31: qty 5

## 2017-10-31 MED ORDER — CIPROFLOXACIN IN D5W 400 MG/200ML IV SOLN
400.0000 mg | Freq: Once | INTRAVENOUS | Status: AC
  Administered 2017-10-31: 400 mg via INTRAVENOUS
  Filled 2017-10-31: qty 200

## 2017-10-31 MED ORDER — IOPAMIDOL (ISOVUE-300) INJECTION 61%
INTRAVENOUS | Status: AC
  Filled 2017-10-31: qty 100

## 2017-10-31 MED ORDER — PROPOFOL 10 MG/ML IV BOLUS
INTRAVENOUS | Status: DC | PRN
  Administered 2017-10-31: 170 mg via INTRAVENOUS

## 2017-10-31 MED ORDER — HYDROCODONE-ACETAMINOPHEN 5-325 MG PO TABS
1.0000 | ORAL_TABLET | Freq: Four times a day (QID) | ORAL | Status: DC | PRN
  Administered 2017-10-31 – 2017-11-03 (×5): 1 via ORAL
  Filled 2017-10-31 (×5): qty 1

## 2017-10-31 MED ORDER — ONDANSETRON HCL 4 MG/2ML IJ SOLN
INTRAMUSCULAR | Status: AC
  Filled 2017-10-31: qty 2

## 2017-10-31 MED ORDER — FENTANYL CITRATE (PF) 100 MCG/2ML IJ SOLN
INTRAMUSCULAR | Status: AC
  Filled 2017-10-31: qty 2

## 2017-10-31 MED ORDER — FENTANYL CITRATE (PF) 100 MCG/2ML IJ SOLN
INTRAMUSCULAR | Status: DC | PRN
  Administered 2017-10-31 (×2): 50 ug via INTRAVENOUS

## 2017-10-31 MED ORDER — SODIUM CHLORIDE 0.9 % IV SOLN
INTRAVENOUS | Status: AC
  Administered 2017-10-31: 75 mL/h via INTRAVENOUS

## 2017-10-31 MED ORDER — LIDOCAINE 2% (20 MG/ML) 5 ML SYRINGE
INTRAMUSCULAR | Status: DC | PRN
  Administered 2017-10-31: 60 mg via INTRAVENOUS

## 2017-10-31 MED ORDER — ONDANSETRON 4 MG PO TBDP
4.0000 mg | ORAL_TABLET | Freq: Once | ORAL | Status: AC | PRN
  Administered 2017-10-31: 4 mg via ORAL
  Filled 2017-10-31: qty 1

## 2017-10-31 MED ORDER — MORPHINE SULFATE (PF) 2 MG/ML IV SOLN
2.0000 mg | INTRAVENOUS | Status: DC | PRN
  Administered 2017-10-31: 2 mg via INTRAVENOUS
  Filled 2017-10-31: qty 1

## 2017-10-31 MED ORDER — MIDAZOLAM HCL 5 MG/5ML IJ SOLN
INTRAMUSCULAR | Status: DC | PRN
  Administered 2017-10-31: 2 mg via INTRAVENOUS

## 2017-10-31 MED ORDER — IOPAMIDOL (ISOVUE-300) INJECTION 61%
100.0000 mL | Freq: Once | INTRAVENOUS | Status: AC | PRN
  Administered 2017-10-31: 100 mL via INTRAVENOUS

## 2017-10-31 MED ORDER — OXYCODONE HCL 5 MG PO TABS
5.0000 mg | ORAL_TABLET | Freq: Once | ORAL | Status: AC | PRN
  Administered 2017-10-31: 5 mg via ORAL

## 2017-10-31 MED ORDER — METRONIDAZOLE IN NACL 5-0.79 MG/ML-% IV SOLN
500.0000 mg | Freq: Once | INTRAVENOUS | Status: AC
  Administered 2017-10-31: 500 mg via INTRAVENOUS
  Filled 2017-10-31: qty 100

## 2017-10-31 MED ORDER — FENTANYL CITRATE (PF) 100 MCG/2ML IJ SOLN
50.0000 ug | INTRAMUSCULAR | Status: DC | PRN
  Administered 2017-10-31: 50 ug via INTRAVENOUS
  Filled 2017-10-31: qty 2

## 2017-10-31 MED ORDER — OXYCODONE HCL 5 MG PO TABS
ORAL_TABLET | ORAL | Status: AC
  Filled 2017-10-31: qty 1

## 2017-10-31 MED ORDER — IOHEXOL 300 MG/ML  SOLN
INTRAMUSCULAR | Status: DC | PRN
  Administered 2017-10-31: 4 mL via URETHRAL

## 2017-10-31 MED ORDER — INSULIN ASPART 100 UNIT/ML ~~LOC~~ SOLN
0.0000 [IU] | Freq: Three times a day (TID) | SUBCUTANEOUS | Status: DC
  Administered 2017-10-31: 2 [IU] via SUBCUTANEOUS
  Administered 2017-11-01: 1 [IU] via SUBCUTANEOUS
  Administered 2017-11-01 – 2017-11-04 (×8): 2 [IU] via SUBCUTANEOUS

## 2017-10-31 MED ORDER — DEXAMETHASONE SODIUM PHOSPHATE 10 MG/ML IJ SOLN
INTRAMUSCULAR | Status: DC | PRN
  Administered 2017-10-31: 10 mg via INTRAVENOUS

## 2017-10-31 MED ORDER — LIDOCAINE 2% (20 MG/ML) 5 ML SYRINGE
INTRAMUSCULAR | Status: AC
  Filled 2017-10-31: qty 5

## 2017-10-31 MED ORDER — EPHEDRINE SULFATE-NACL 50-0.9 MG/10ML-% IV SOSY
PREFILLED_SYRINGE | INTRAVENOUS | Status: DC | PRN
  Administered 2017-10-31: 10 mg via INTRAVENOUS

## 2017-10-31 MED ORDER — EPHEDRINE 5 MG/ML INJ
INTRAVENOUS | Status: AC
  Filled 2017-10-31: qty 10

## 2017-10-31 MED ORDER — LACTATED RINGERS IV SOLN
INTRAVENOUS | Status: DC | PRN
  Administered 2017-10-31: 12:00:00 via INTRAVENOUS

## 2017-10-31 MED ORDER — ACETAMINOPHEN 650 MG RE SUPP: 650.0000 mg | Freq: Four times a day (QID) | RECTAL | Status: DC | PRN

## 2017-10-31 MED ORDER — ATORVASTATIN CALCIUM 40 MG PO TABS
40.0000 mg | ORAL_TABLET | Freq: Every evening | ORAL | Status: DC
  Administered 2017-10-31 – 2017-11-03 (×4): 40 mg via ORAL
  Filled 2017-10-31 (×3): qty 1

## 2017-10-31 MED ORDER — FENTANYL CITRATE (PF) 100 MCG/2ML IJ SOLN: 25.0000 ug | INTRAMUSCULAR | Status: DC | PRN

## 2017-10-31 MED ORDER — DEXAMETHASONE SODIUM PHOSPHATE 10 MG/ML IJ SOLN
INTRAMUSCULAR | Status: AC
  Filled 2017-10-31: qty 1

## 2017-10-31 MED ORDER — ACETAMINOPHEN 325 MG PO TABS
650.0000 mg | ORAL_TABLET | Freq: Four times a day (QID) | ORAL | Status: DC | PRN
  Administered 2017-10-31 – 2017-11-02 (×2): 650 mg via ORAL
  Filled 2017-10-31 (×3): qty 2

## 2017-10-31 MED ORDER — LACTATED RINGERS IV SOLN: INTRAVENOUS | Status: DC

## 2017-10-31 MED ORDER — SODIUM CHLORIDE 0.9 % IV SOLN
3.0000 g | Freq: Four times a day (QID) | INTRAVENOUS | Status: DC
  Administered 2017-10-31 – 2017-11-04 (×16): 3 g via INTRAVENOUS
  Filled 2017-10-31 (×19): qty 3

## 2017-10-31 MED ORDER — ALPRAZOLAM 0.25 MG PO TABS
0.2500 mg | ORAL_TABLET | Freq: Every evening | ORAL | Status: DC | PRN
  Administered 2017-11-02 – 2017-11-03 (×3): 0.25 mg via ORAL
  Filled 2017-10-31 (×3): qty 1

## 2017-10-31 MED ORDER — ONDANSETRON HCL 4 MG PO TABS: 4.0000 mg | ORAL_TABLET | Freq: Four times a day (QID) | ORAL | Status: DC | PRN

## 2017-10-31 MED ORDER — MIDAZOLAM HCL 2 MG/2ML IJ SOLN
INTRAMUSCULAR | Status: AC
  Filled 2017-10-31: qty 2

## 2017-10-31 MED ORDER — ONDANSETRON HCL 4 MG/2ML IJ SOLN: 4.0000 mg | Freq: Four times a day (QID) | INTRAMUSCULAR | Status: DC | PRN

## 2017-10-31 MED ORDER — PROPOFOL 10 MG/ML IV BOLUS
INTRAVENOUS | Status: AC
  Filled 2017-10-31: qty 20

## 2017-10-31 MED ORDER — ONDANSETRON HCL 4 MG/2ML IJ SOLN
INTRAMUSCULAR | Status: DC | PRN
  Administered 2017-10-31: 4 mg via INTRAVENOUS

## 2017-10-31 MED ORDER — ONDANSETRON 4 MG PO TBDP: 4.0000 mg | ORAL_TABLET | Freq: Three times a day (TID) | ORAL | Status: DC | PRN

## 2017-10-31 MED ORDER — SODIUM CHLORIDE 0.9 % IR SOLN
Status: DC | PRN
  Administered 2017-10-31: 1000 mL

## 2017-10-31 SURGICAL SUPPLY — 13 items
BAG URO CATCHER STRL LF (MISCELLANEOUS) ×3 IMPLANT
CATH URET 5FR 28IN OPEN ENDED (CATHETERS) ×2 IMPLANT
CLOTH BEACON ORANGE TIMEOUT ST (SAFETY) ×3 IMPLANT
COVER FOOTSWITCH UNIV (MISCELLANEOUS) ×2 IMPLANT
COVER SURGICAL LIGHT HANDLE (MISCELLANEOUS) ×3 IMPLANT
GLOVE SURG SS PI 8.0 STRL IVOR (GLOVE) ×2 IMPLANT
GOWN STRL REUS W/TWL XL LVL3 (GOWN DISPOSABLE) ×3 IMPLANT
GUIDEWIRE STR DUAL SENSOR (WIRE) ×3 IMPLANT
MANIFOLD NEPTUNE II (INSTRUMENTS) ×3 IMPLANT
PACK CYSTO (CUSTOM PROCEDURE TRAY) ×3 IMPLANT
STENT URET 6FRX24 CONTOUR (STENTS) ×2 IMPLANT
TUBING CONNECTING 10 (TUBING) ×2 IMPLANT
TUBING CONNECTING 10' (TUBING) ×1

## 2017-10-31 NOTE — Consult Note (Signed)
Subjective: CC: left abdominal pain.  Hx: Doris Wyatt is a 71yo WM who I was asked to see in consultation by Dr. Blaine Hamper for left abdominal pain with a left proximal stone with obstruction and possible UTI.   She has been having some pain for 2 weeks.  The pain was mild at first and she felt it was like her prior stone pain.  She has had some nausea but that was associated with antibiotics for diverticulitis.  She has had no fever.  She has had some urgency and frequency but no hematuria.  She has a history of stones but has not required surgery.  She not had recent UTI's.   She came to the ER because the pain became more severe.   A CT shows a 9 x 54mm left UPJ stone and an 80mm LLP stone.  There is hydronephrosis.   She also has diverticulitis with a small contained perforation without abscess.   Her pain had been getting better on the antibiotics until the flair last night.   ROS:  Review of Systems  Constitutional: Negative for fever.  Respiratory: Positive for shortness of breath.   Cardiovascular: Negative for chest pain.  Gastrointestinal: Positive for abdominal pain, diarrhea and nausea.  Genitourinary: Positive for flank pain and urgency.  Neurological: Positive for focal weakness (left leg over the last few days. ).  All other systems reviewed and are negative.   No Known Allergies  Past Medical History:  Diagnosis Date  . COPD (chronic obstructive pulmonary disease) ([place])   . Diabetes mellitus   . Hypercholesterolemia   . Hyperlipemia   . Hypertension   . Osteoporosis     Past Surgical History:  Procedure Laterality Date  . APPENDECTOMY    . BACK SURGERY    . TUBAL LIGATION      Social History   Socioeconomic History  . Marital status: Widowed    Spouse name: Not on file  . Number of children: Not on file  . Years of education: Not on file  . Highest education level: Not on file  Social Needs  . Financial resource strain: Not on file  . Food insecurity - worry:  Not on file  . Food insecurity - inability: Not on file  . Transportation needs - medical: Not on file  . Transportation needs - non-medical: Not on file  Occupational History  . Not on file  Tobacco Use  . Smoking status: Former Research scientist (life sciences)  . Smokeless tobacco: Never Used  Substance and Sexual Activity  . Alcohol use: Yes    Comment: occaisionally  . Drug use: No  . Sexual activity: Not on file  Other Topics Concern  . Not on file  Social History Narrative  . Not on file    History reviewed. No pertinent family history.  Anti-infectives: Anti-infectives (From admission, onward)   Start     Dose/Rate Route Frequency Ordered Stop   10/31/17 1000  Ampicillin-Sulbactam (UNASYN) 3 g in sodium chloride 0.9 % 100 mL IVPB     3 g 200 mL/hr over 30 Minutes Intravenous Every 6 hours 10/31/17 0845     10/31/17 0645  ciprofloxacin (CIPRO) IVPB 400 mg     400 mg 200 mL/hr over 60 Minutes Intravenous  Once 10/31/17 0643     10/31/17 0645  metroNIDAZOLE (FLAGYL) IVPB 500 mg     500 mg 100 mL/hr over 60 Minutes Intravenous  Once 10/31/17 3382 10/31/17 0827      Current Facility-Administered  Medications  Medication Dose Route Frequency Provider Last Rate Last Dose  . Ampicillin-Sulbactam (UNASYN) 3 g in sodium chloride 0.9 % 100 mL IVPB  3 g Intravenous Q6H Glogovac, Nikola, RPH      . ciprofloxacin (CIPRO) IVPB 400 mg  400 mg Intravenous Once Molpus, Cassidy Tabet, MD 200 mL/hr at 10/31/17 0823 400 mg at 10/31/17 0823  . fentaNYL (SUBLIMAZE) injection 50 mcg  50 mcg Intravenous Q1H PRN Molpus, Avraham Benish, MD   50 mcg at 10/31/17 0458  . morphine 2 MG/ML injection 2 mg  2 mg Intravenous Q4H PRN Ivor Costa, MD   2 mg at 10/31/17 6578  . ondansetron (ZOFRAN-ODT) disintegrating tablet 4 mg  4 mg Oral Q8H PRN Ivor Costa, MD       Current Outpatient Medications  Medication Sig Dispense Refill  . albuterol (PROVENTIL HFA;VENTOLIN HFA) 108 (90 BASE) MCG/ACT inhaler Inhale 2 puffs into the lungs every 6 (six)  hours as needed. For wheeze or shortness of breath    . alendronate (FOSAMAX) 70 MG tablet Take 70 mg by mouth every 7 (seven) days. Take with a full glass of water on an empty stomach. Takes on Tuesday.    . ALPRAZolam (XANAX) 0.25 MG tablet Take 0.25 mg by mouth at bedtime as needed for anxiety.    Marland Kitchen aspirin 325 MG tablet Take 325 mg by mouth daily.    Marland Kitchen atorvastatin (LIPITOR) 40 MG tablet Take 40 mg by mouth every evening.     . Calcium Carbonate-Vitamin D (CALCIUM 500/D PO) Take 1 tablet 4 (four) times daily by mouth.     . Fluticasone-Salmeterol (ADVAIR) 100-50 MCG/DOSE AEPB Inhale 1 puff 2 (two) times daily into the lungs.    Marland Kitchen glimepiride (AMARYL) 2 MG tablet Take 2 mg by mouth daily.  0  . HYDROcodone-acetaminophen (NORCO/VICODIN) 5-325 MG tablet Take 1 tablet every 6 (six) hours by mouth.    . Menthol-Zinc Oxide (GOLD BOND EX) Apply 1 application topically daily as needed (for feet and hands).    . metFORMIN (GLUCOPHAGE) 1000 MG tablet Take 1,000-1,500 mg by mouth 2 (two) times daily with a meal. Takes 1.5 tabs= 1500mg  in am and 1 tab=1000mg  in pm    . Multiple Vitamin (MULITIVITAMIN WITH MINERALS) TABS Take 1 tablet by mouth daily.    . ondansetron (ZOFRAN ODT) 4 MG disintegrating tablet Take 1 tablet (4 mg total) every 8 (eight) hours as needed by mouth for nausea or vomiting. 20 tablet 0  . valsartan (DIOVAN) 80 MG tablet Take 80 mg by mouth daily.       Objective: Vital signs in last 24 hours: Temp:  [98.3 F (36.8 C)] 98.3 F (36.8 C) (12/02 0349) Pulse Rate:  [96-103] 101 (12/02 0800) Resp:  [14-27] 18 (12/02 0800) BP: (121-153)/(67-87) 121/67 (12/02 0800) SpO2:  [84 %-95 %] 84 % (12/02 0800) Weight:  [59 kg (130 lb)] 59 kg (130 lb) (12/02 0349)  Intake/Output from previous day: No intake/output data recorded. Intake/Output this shift: No intake/output data recorded.   Physical Exam  Constitutional: She is oriented to person, place, and time and well-developed,  well-nourished, and in no distress.  HENT:  Head: Normocephalic and atraumatic.  Neck: Normal range of motion. Neck supple. No thyromegaly present.  Cardiovascular: Regular rhythm and normal heart sounds. Tachycardia present.  Pulmonary/Chest: Effort normal and breath sounds normal. No respiratory distress.  Abdominal: Soft. There is tenderness (mild LLQ,  severe LCVAT).  Musculoskeletal: Normal range of motion. She exhibits no edema  or tenderness.  Lymphadenopathy:    She has no cervical adenopathy.       Right: No supraclavicular adenopathy present.       Left: No supraclavicular adenopathy present.  Neurological: She is alert and oriented to person, place, and time.  Skin: Skin is warm and dry.  Psychiatric: Mood and affect normal.    Lab Results:  Recent Labs    10/31/17 0407  WBC 22.3*  HGB 13.2  HCT 39.7  PLT 300   BMET Recent Labs    10/31/17 0407  NA 136  K 3.8  CL 101  CO2 25  GLUCOSE 212*  BUN 16  CREATININE 0.55  CALCIUM 9.6   PT/INR No results for input(s): LABPROT, INR in the last 72 hours. ABG No results for input(s): PHART, HCO3 in the last 72 hours.  Invalid input(s): PCO2, PO2  Studies/Results: Ct Abdomen Pelvis W Contrast  Result Date: 10/31/2017 CLINICAL DATA:  Lower abdominal pain and bloating. EXAM: CT ABDOMEN AND PELVIS WITH CONTRAST TECHNIQUE: Multidetector CT imaging of the abdomen and pelvis was performed using the standard protocol following bolus administration of intravenous contrast. CONTRAST:  123mL ISOVUE-300 IOPAMIDOL (ISOVUE-300) INJECTION 61% COMPARISON:  CT 04/02/2016 FINDINGS: Lower chest: Emphysema at the lung bases. Remote right lateral rib fracture. Coronary artery calcifications and distal thoracic aortic atherosclerosis. Hepatobiliary: 9 mm cyst in the right lobe of the liver. No suspicious hepatic lesion. Gallbladder physiologically distended, no calcified stone. No biliary dilatation. Pancreas: No ductal dilatation or  inflammation. Spleen: Normal in size without focal abnormality. Adrenals/Urinary Tract: No adrenal nodule. 7 mm stone in the left proximal ureter with mild left hydronephrosis. No significant perinephric edema. Nonobstructing 9 mm calculus in the upper left kidney. No right hydronephrosis or stone. Minimal cortical scarring in the posterior right kidney. Urinary bladder is physiologically distended. No bladder wall thickening. Stomach/Bowel: Acute diverticulitis with inflamed diverticulum involving the mid descending colon with pericolonic stranding and small amount of free fluid. Tiny focus of extraluminal air is best appreciated on coronal image 53. No abscess. There is colonic wall thickening and fat stranding. Multiple noninflamed diverticular seen from the transverse colon distally. The stomach is nondistended. No small bowel inflammation, wall thickening or obstruction. Appendix not visualized, surgically absent per history. Vascular/Lymphatic: Advanced aortic and branch atherosclerosis. No aneurysm. No enlarged abdominal or pelvic lymph nodes. Prominent left ovarian vein. Reproductive: Prominent left adnexal vascularity. No adnexal mass. Uterus is quiescent, normal for age. Other: Tiny focus of extraluminal air adjacent to diverticular inflammation. No free air elsewhere. No intra-abdominal abscess or ascites. Musculoskeletal: There are no acute or suspicious osseous abnormalities. Postsurgical change at L4-L5. Degenerative change of both hips. IMPRESSION: 1. Acute descending colonic diverticulitis. Micro perforation with small focus of extraluminal air. No abscess. 2. Partially obstructing 7 mm stone in the left proximal ureter with mild hydronephrosis. Additional nonobstructing stone in the upper left kidney. 3.  Advanced Aortic Atherosclerosis (ICD10-I70.0). 4.  Emphysema noted at the lung bases (ICD10-J43.9). These results were called by telephone at the time of interpretation on 10/31/2017 at 6:33 am to  Dr. Shanon Rosser , who verbally acknowledged these results. Electronically Signed   By: Jeb Levering M.D.   On: 10/31/2017 06:35     Assessment: Left proximal stone with pain and obstruction and possible UTI.   I don't think she will pass this stone and with the possible UTI, I am going to get her set up for cystoscopy and ureteral stenting.   I have  reviewed the risks of bleeding, infection, ureteral injury, need for secondary procedures to remove the stone, thrombotic events and anesthetic complications.    Diverticulitis.   She has milder LLQ pain but will need to continue abx therapy per medicine.     CC: Dr. Ivor Costa and Dr. Leighton Ruff.      Irine Seal 10/31/2017 [phone]

## 2017-10-31 NOTE — H&P (Signed)
History and Physical    Doris Wyatt KTG:[id number] DOB: 1946-10-07 DOA: 10/31/2017  PCP: Default, Provider, MD  Patient coming from:  home  Chief Complaint:  Abdominal pain  HPI: Doris Wyatt is a 71 y.o. female with medical history significant of COPD, DM, HTN, HLD comes in with worsening llq abdominal pain and lower back pain more on the left.  Pt reports she was treated with cipro and flagyl as an outpatient and completed these abx within the last week for acute diverticulitis.  She improved and was doing better.  Than about 2 days ago pain came back and it was somewhat the same but different.  Before she was only having left lower abominal pain, when it came back it was not only in the LLQ but also in the back.  She denies any urinary changes.  No dysuria or hematuria. No radiation of the pain.  No fevers.  No n/v/d.  Pt found to have on CT scan both a left partially obstructing ureter stone with left hydronephrosis but also diverticulitis with microperforation.  She is referred for admission for iv abx and further evaluation of the etiology of her pain.  Review of Systems: As per HPI otherwise 10 point review of systems negative.   Past Medical History:  Diagnosis Date  . COPD (chronic obstructive pulmonary disease) ([place])   . Diabetes mellitus   . Hypercholesterolemia   . Hyperlipemia   . Hypertension   . Osteoporosis   . Urolithiasis     Past Surgical History:  Procedure Laterality Date  . APPENDECTOMY    . BACK SURGERY    . TUBAL LIGATION       reports that she quit smoking about 15 years ago. Her smoking use included cigarettes. She has a 120.00 pack-year smoking history. she has never used smokeless tobacco. She reports that she drinks alcohol. She reports that she does not use drugs.  No Known Allergies  History reviewed. No pertinent family history.  No premature CAD  Prior to Admission medications   Medication Sig Start Date End Date Taking?  Authorizing Provider  albuterol (PROVENTIL HFA;VENTOLIN HFA) 108 (90 BASE) MCG/ACT inhaler Inhale 2 puffs into the lungs every 6 (six) hours as needed. For wheeze or shortness of breath    [provider]  alendronate (FOSAMAX) 70 MG tablet Take 70 mg by mouth every 7 (seven) days. Take with a full glass of water on an empty stomach. Takes on Tuesday.    [provider]  ALPRAZolam Duanne Moron) 0.25 MG tablet Take 0.25 mg by mouth at bedtime as needed for anxiety.    [provider]  aspirin 325 MG tablet Take 325 mg by mouth daily.    [provider]  atorvastatin (LIPITOR) 40 MG tablet Take 40 mg by mouth every evening.     [provider]  Calcium Carbonate-Vitamin D (CALCIUM 500/D PO) Take 1 tablet 4 (four) times daily by mouth.     [provider]  ciprofloxacin (CIPRO) 500 MG tablet Take 1 tablet (500 mg total) every 12 (twelve) hours by mouth. 10/13/17   Varney Biles, MD  Fluticasone-Salmeterol (ADVAIR) 100-50 MCG/DOSE AEPB Inhale 1 puff 2 (two) times daily into the lungs.    [provider]  glimepiride (AMARYL) 2 MG tablet Take 2 mg by mouth daily. 10/16/16   [provider]  HYDROcodone-acetaminophen (NORCO/VICODIN) 5-325 MG tablet Take 1 tablet every 6 (six) hours by mouth. 08/25/17   [provider]  Menthol-Zinc Oxide (GOLD BOND EX) Apply 1 application topically daily as needed (for feet and hands).    [provider]  metFORMIN (GLUCOPHAGE) 1000 MG tablet Take 1,000-1,500 mg by mouth 2 (two) times daily with a meal. Takes 1.5 tabs= 1500mg  in am and 1 tab=1000mg  in pm    [provider]  metroNIDAZOLE (FLAGYL) 500 MG tablet Take 1 tablet (500 mg total) 2 (two) times daily by mouth. 10/13/17   Varney Biles, MD  Multiple Vitamin (MULITIVITAMIN WITH MINERALS) TABS Take 1 tablet by mouth daily.    [provider]  ondansetron (ZOFRAN ODT) 4 MG disintegrating tablet Take 1 tablet (4 mg  total) every 8 (eight) hours as needed by mouth for nausea or vomiting. 10/13/17   Varney Biles, MD  valsartan (DIOVAN) 80 MG tablet Take 80 mg by mouth daily.    [provider]    Physical Exam: Vitals:   10/31/17 0600 10/31/17 0730 10/31/17 0800 10/31/17 0900  BP: 133/68 (!) 149/72 121/67 130/67  Pulse: (!) 103 100 (!) 101 95  Resp: 17 (!) 23 18 16   Temp:      TempSrc:      SpO2: (!) 86% 93% (!) 84% 92%  Weight:      Height:          Constitutional: NAD, calm, comfortable Vitals:   10/31/17 0600 10/31/17 0730 10/31/17 0800 10/31/17 0900  BP: 133/68 (!) 149/72 121/67 130/67  Pulse: (!) 103 100 (!) 101 95  Resp: 17 (!) 23 18 16   Temp:      TempSrc:      SpO2: (!) 86% 93% (!) 84% 92%  Weight:      Height:       Eyes: PERRL, lids and conjunctivae normal ENMT: Mucous membranes are moist. Posterior pharynx clear of any exudate or lesions.Normal dentition.  Neck: normal, supple, no masses, no thyromegaly Respiratory: clear to auscultation bilaterally, no wheezing, no crackles. Normal respiratory effort. No accessory muscle use.  Cardiovascular: Regular rate and rhythm, no murmurs / rubs / gallops. No extremity edema. 2+ pedal pulses. No carotid bruits.  Abdomen: llq tenderness, no cva ttp no masses palpated. No hepatosplenomegaly. Bowel sounds positive. No r/g, nonacute soft Musculoskeletal: no clubbing / cyanosis. No joint deformity upper and lower extremities. Good ROM, no contractures. Normal muscle tone.  Skin: no rashes, lesions, ulcers. No induration Neurologic: CN 2-12 grossly intact. Sensation intact, DTR normal. Strength 5/5 in all 4.  Psychiatric: Normal judgment and insight. Alert and oriented x 3. Normal mood.    Labs on Admission: I have personally reviewed following labs and imaging studies  CBC: Recent Labs  Lab 10/31/17 0407  WBC 22.3*  NEUTROABS 18.3*  HGB 13.2  HCT 39.7  MCV 93.6  PLT 932   Basic Metabolic Panel: Recent Labs  Lab  10/31/17 0407  NA 136  K 3.8  CL 101  CO2 25  GLUCOSE 212*  BUN 16  CREATININE 0.55  CALCIUM 9.6   GFR: Estimated Creatinine Clearance: 56.4 mL/min (by C-G formula based on SCr of 0.55 mg/dL). Liver Function Tests: Recent Labs  Lab 10/31/17 0407  AST 24  ALT 20  ALKPHOS 41  BILITOT 0.8  PROT 7.0  ALBUMIN 4.1   Recent Labs  Lab 10/31/17 0407  LIPASE 180*   No results for input(s): AMMONIA in the last 168 hours. Coagulation Profile: No results for input(s): INR, PROTIME in the last 168 hours. Cardiac Enzymes: No results for input(s): CKTOTAL, CKMB,  CKMBINDEX, TROPONINI in the last 168 hours. BNP (last 3 results) No results for input(s): PROBNP in the last 8760 hours. HbA1C: No results for input(s): HGBA1C in the last 72 hours. CBG: No results for input(s): GLUCAP in the last 168 hours. Lipid Profile: No results for input(s): CHOL, HDL, LDLCALC, TRIG, CHOLHDL, LDLDIRECT in the last 72 hours. Thyroid Function Tests: No results for input(s): TSH, T4TOTAL, FREET4, T3FREE, THYROIDAB in the last 72 hours. Anemia Panel: No results for input(s): VITAMINB12, FOLATE, FERRITIN, TIBC, IRON, RETICCTPCT in the last 72 hours. Urine analysis:    Component Value Date/Time   COLORURINE YELLOW 10/31/2017 0407   APPEARANCEUR CLEAR 10/31/2017 0407   LABSPEC 1.028 10/31/2017 0407   PHURINE 5.0 10/31/2017 0407   GLUCOSEU 50 (A) 10/31/2017 0407   HGBUR MODERATE (A) 10/31/2017 0407   BILIRUBINUR NEGATIVE 10/31/2017 0407   KETONESUR NEGATIVE 10/31/2017 0407   PROTEINUR NEGATIVE 10/31/2017 0407   UROBILINOGEN 0.2 01/12/2009 0757   NITRITE NEGATIVE 10/31/2017 0407   LEUKOCYTESUR TRACE (A) 10/31/2017 0407   Sepsis Labs: !!!!!!!!!!!!!!!!!!!!!!!!!!!!!!!!!!!!!!!!!!!! @LABRCNTIP (procalcitonin:4,lacticidven:4) )No results found for this or any previous visit (from the past 240 hour(s)).   Radiological Exams on Admission: Ct Abdomen Pelvis W Contrast  Result Date: 10/31/2017 CLINICAL  DATA:  Lower abdominal pain and bloating. EXAM: CT ABDOMEN AND PELVIS WITH CONTRAST TECHNIQUE: Multidetector CT imaging of the abdomen and pelvis was performed using the standard protocol following bolus administration of intravenous contrast. CONTRAST:  158mL ISOVUE-300 IOPAMIDOL (ISOVUE-300) INJECTION 61% COMPARISON:  CT 04/02/2016 FINDINGS: Lower chest: Emphysema at the lung bases. Remote right lateral rib fracture. Coronary artery calcifications and distal thoracic aortic atherosclerosis. Hepatobiliary: 9 mm cyst in the right lobe of the liver. No suspicious hepatic lesion. Gallbladder physiologically distended, no calcified stone. No biliary dilatation. Pancreas: No ductal dilatation or inflammation. Spleen: Normal in size without focal abnormality. Adrenals/Urinary Tract: No adrenal nodule. 7 mm stone in the left proximal ureter with mild left hydronephrosis. No significant perinephric edema. Nonobstructing 9 mm calculus in the upper left kidney. No right hydronephrosis or stone. Minimal cortical scarring in the posterior right kidney. Urinary bladder is physiologically distended. No bladder wall thickening. Stomach/Bowel: Acute diverticulitis with inflamed diverticulum involving the mid descending colon with pericolonic stranding and small amount of free fluid. Tiny focus of extraluminal air is best appreciated on coronal image 53. No abscess. There is colonic wall thickening and fat stranding. Multiple noninflamed diverticular seen from the transverse colon distally. The stomach is nondistended. No small bowel inflammation, wall thickening or obstruction. Appendix not visualized, surgically absent per history. Vascular/Lymphatic: Advanced aortic and branch atherosclerosis. No aneurysm. No enlarged abdominal or pelvic lymph nodes. Prominent left ovarian vein. Reproductive: Prominent left adnexal vascularity. No adnexal mass. Uterus is quiescent, normal for age. Other: Tiny focus of extraluminal air adjacent  to diverticular inflammation. No free air elsewhere. No intra-abdominal abscess or ascites. Musculoskeletal: There are no acute or suspicious osseous abnormalities. Postsurgical change at L4-L5. Degenerative change of both hips. IMPRESSION: 1. Acute descending colonic diverticulitis. Micro perforation with small focus of extraluminal air. No abscess. 2. Partially obstructing 7 mm stone in the left proximal ureter with mild hydronephrosis. Additional nonobstructing stone in the upper left kidney. 3.  Advanced Aortic Atherosclerosis (ICD10-I70.0). 4.  Emphysema noted at the lung bases (ICD10-J43.9). These results were called by telephone at the time of interpretation on 10/31/2017 at 6:33 am to Dr. Shanon Rosser , who verbally acknowledged these results. Electronically Signed   By: Fonnie Birkenhead.D.  On: 10/31/2017 06:35    Old chart reviewed Case discussed with dr Jeffie Pollock with urology service  Assessment/Plan 71 yo female with llq/back pain found to have recurrent diverticulitis with microperforation and left partially obstructive ureter stone with left hydronephrosis  Principal Problem:   Abdominal pain, acute, left lower quadrant- hard to determine if her symptoms are due to the diverticultiis or the stone or both.  Treat both.  Abdominal exam is benign.  Active Problems:   Diverticulitis of large intestine with complication- no absess but with microperforation.  Given cipro and flagyl in ED, change to iv unasyn due to recent abx usage.  abd exam is benign.  Will cover urology issues and if pt does not improve with abx and urology recommendations would get surgery involved.  No need for urgent or emergent surgical eval at this time.   Ureteral stone with hydronephrosis- spoken to dr Jeffie Pollock who will evaluate pt.  Make npo for potential stent placement today and stone extraction.  Iv unasyn, urine cx ordered   Hypertension- stable, cont home meds   COPD (chronic obstructive pulmonary disease) ([facility])-  stable   Hyperlipemia- stable   Diabetes ([place])- hold oral agents, place on SSI    DVT prophylaxis:  scds  Code Status:  full Family Communication:  none  Disposition Plan:  Per day team Consults called:  Urology dr Jeffie Pollock Admission status:  admit   Reegan Bouffard A MD [facility]  If 7PM-7AM, please contact night-coverage [email or web address] Password [facility]  10/31/2017, 9:47 AM

## 2017-10-31 NOTE — Progress Notes (Signed)
This is a no charge note  Pending admission per Dr. Florina Ou  71 year old lady with past medical history hypertension, hyperlipidemia, diabetes mellitus, COPD, treated diverticulitis 3 weeks ago, who presents with lower abdominal pain.   CT scan showed: 1. Acute descending colonic diverticulitis. Micro perforation with small focus of extraluminal air. No abscess. 2. Partially obstructing 7 mm stone in the left proximal ureter with mild hydronephrosis. Additional nonobstructing stone in the upper left kidney.  Patient is started with the Cipro and Flagyl. Will need surgery and urology consultation in morning. Patient is admitted to telemetry bed as inpatient.  Ivor Costa, MD  [facility] Pager [phone]  If 7PM-7AM, please contact night-coverage [email or web address] Password [facility] 10/31/2017, 6:51 AM

## 2017-10-31 NOTE — Op Note (Signed)
Procedure: Cystoscopy with left retrograde pyelogram and interpretation, insertion of left double-J stent.  Preop diagnosis: Left UPJ stone with obstruction and UTI.  Postoperative diagnosis: Left UPJ stone with pyonephrosis.  Surgeon: Dr. Irine Seal.  Anesthesia: General.  Drain: 6 French by 24 cm contour double-J stent.  Specimen: None.  EBL: None.  Complications: None.  Findings: 13 x 7 mm left UPJ stone with obstruction and mild pyonephrosis.  Indications: The patient is a 71 year old white female who presented to the emergency room with left flank pain.  She was recently treated for diverticulitis with some improvement in the pain but it became worse yesterday and was associated with nausea.  She also had some abnormal urinary sensations.  A CT scan in the ER demonstrated a 13 x 7 mm left UPJ stone with moderate hydronephrosis.  There was also an 8 mm stone in the lower pole.  She had a small area of diverticulosis with a contained perforation and no abscess in the left lower quadrant.  It was felt that cystoscopy and stenting was indicated because the urine was suggestive of urinary tract infection.  Procedure: She was taken to the operating room where general anesthetic was induced.  She had received Cipro and Unasyn in the emergency room.  She was placed in the lithotomy position and fitted with PAS hose.  Her perineum and genitalia were prepped with Betadine solution and she was draped in the usual sterile fashion.  Cystoscopy was performed with a 23 [place] scope and 30 degree lens.  Inspection revealed a normal urethra.  The bladder wall had mild trabeculation but no mucosal abnormalities.  The ureteral orifices were unremarkable.  The left ureteral orifice was cannulated with a 5 French opening catheter and contrast was instilled.  Left retrograde pyelogram demonstrated a normal ureter up to the UPJ where there was a filling defect consistent with the stone seen on CT scan and  there was proximal hydronephrosis.  A guidewire was passed through the opening catheter to the kidney.  A 6 French 24 cm contour double-J stent was then passed over the wire to the kidney.  The wire was removed leaving a good coil in the kidney and in the bladder.  Inspection of the distal loop after removal of the wire demonstrated purulent urine effluxing from the ports.  The bladder was drained, the cystoscope was removed and she was taken to the recovery room in stable condition.  There were no complications.  I will arrange for her to have a ureteroscopy for definitive stone management at a later date.

## 2017-10-31 NOTE — ED Provider Notes (Signed)
[facility] DEPT Provider Note: Georgena Spurling, MD, FACEP  CSN: [id number] MRN: [id number] ARRIVAL: 10/31/17 at [place]: [place]  Abdominal Pain   HISTORY OF PRESENT ILLNESS  10/31/17 4:18 AM Doris Wyatt is a 71 y.o. female with a history of diverticulosis who was treated clinically for diverticulitis on 14 November.  No CT scan was done at that time.  Her presenting complaint was left lower quadrant pain.  She has been off antibiotics for about a week.  About 2 days ago she was finally feeling back to baseline and was able to eat several meals yesterday.  Overnight her pain returned and she now rates it a 5 out of 10.  It is worse with movement or palpation.  It is again present in the left lower quadrant but she also has component of left flank pain.  She took a hydrocodone tablet at 130 this morning with some relief.  She is having nausea but no vomiting.  Her abdomen feels bloated.  She is having difficulty characterizing the pain but states it is not sharp.  She cannot say it is like the pain of her previous diverticulitis.  She was given Zofran per protocol prior to my evaluation with relief of her nausea.   Past Medical History:  Diagnosis Date  . COPD (chronic obstructive pulmonary disease) ([place])   . Diabetes mellitus   . Hypercholesterolemia   . Hyperlipemia   . Hypertension   . Osteoporosis     Past Surgical History:  Procedure Laterality Date  . APPENDECTOMY    . BACK SURGERY    . TUBAL LIGATION      History reviewed. No pertinent family history.  Social History   Tobacco Use  . Smoking status: Former Research scientist (life sciences)  . Smokeless tobacco: Never Used  Substance Use Topics  . Alcohol use: Yes    Comment: occaisionally  . Drug use: No    Prior to Admission medications   Medication Sig Start Date End Date Taking? Authorizing Provider  albuterol (PROVENTIL HFA;VENTOLIN HFA) 108 (90 BASE) MCG/ACT inhaler Inhale 2 puffs into the lungs  every 6 (six) hours as needed. For wheeze or shortness of breath    [provider]  alendronate (FOSAMAX) 70 MG tablet Take 70 mg by mouth every 7 (seven) days. Take with a full glass of water on an empty stomach. Takes on Tuesday.    [provider]  ALPRAZolam Duanne Moron) 0.25 MG tablet Take 0.25 mg by mouth at bedtime as needed for anxiety.    [provider]  aspirin 325 MG tablet Take 325 mg by mouth daily.    [provider]  atorvastatin (LIPITOR) 40 MG tablet Take 40 mg by mouth every evening.     [provider]  Calcium Carbonate-Vitamin D (CALCIUM 500/D PO) Take 1 tablet 4 (four) times daily by mouth.     [provider]  ciprofloxacin (CIPRO) 500 MG tablet Take 1 tablet (500 mg total) every 12 (twelve) hours by mouth. 10/13/17   Varney Biles, MD  Fluticasone-Salmeterol (ADVAIR) 100-50 MCG/DOSE AEPB Inhale 1 puff 2 (two) times daily into the lungs.    [provider]  glimepiride (AMARYL) 2 MG tablet Take 2 mg by mouth daily. 10/16/16   [provider]  HYDROcodone-acetaminophen (NORCO/VICODIN) 5-325 MG tablet Take 1 tablet every 6 (six) hours by mouth. 08/25/17   [provider]  Menthol-Zinc Oxide (GOLD BOND EX) Apply 1 application topically daily as needed (  for feet and hands).    [provider]  metFORMIN (GLUCOPHAGE) 1000 MG tablet Take 1,000-1,500 mg by mouth 2 (two) times daily with a meal. Takes 1.5 tabs= 1500mg  in am and 1 tab=1000mg  in pm    [provider]  metroNIDAZOLE (FLAGYL) 500 MG tablet Take 1 tablet (500 mg total) 2 (two) times daily by mouth. 10/13/17   Varney Biles, MD  Multiple Vitamin (MULITIVITAMIN WITH MINERALS) TABS Take 1 tablet by mouth daily.    [provider]  ondansetron (ZOFRAN ODT) 4 MG disintegrating tablet Take 1 tablet (4 mg total) every 8 (eight) hours as needed by mouth for nausea or vomiting. 10/13/17   Varney Biles, MD  valsartan (DIOVAN)  80 MG tablet Take 80 mg by mouth daily.    [provider]    Allergies Patient has no known allergies.   REVIEW OF SYSTEMS  Negative except as noted here or in the History of Present Illness.   PHYSICAL EXAMINATION  Initial Vital Signs Blood pressure (!) 153/87, pulse 97, temperature 98.3 F (36.8 C), temperature source Oral, resp. rate 18, height 5\' 5"  (1.651 m), weight 59 kg (130 lb), SpO2 93 %.  Examination General: Well-developed, well-nourished female in no acute distress; appearance consistent with age of record HENT: normocephalic; atraumatic; widespread dental decay Eyes: pupils equal, round and reactive to light; extraocular muscles intact Neck: supple Heart: regular rate and rhythm Lungs: clear to auscultation bilaterally Abdomen: soft; nondistended; left lower quadrant tenderness; no masses or hepatosplenomegaly; bowel sounds hypoactive Extremities: No deformity; full range of motion; pulses normal Neurologic: Awake, alert and oriented; motor function intact in all extremities and symmetric; no facial droop Skin: Warm and dry Psychiatric: Flat affect   RESULTS  Summary of this visit's results, reviewed by myself:   EKG Interpretation  Date/Time:    Ventricular Rate:    PR Interval:    QRS Duration:   QT Interval:    QTC Calculation:   R Axis:     Text Interpretation:        Laboratory Studies: Results for orders placed or performed during the hospital encounter of 10/31/17 (from the past 24 hour(s))  Lipase, blood     Status: Abnormal   Collection Time: 10/31/17  4:07 AM  Result Value Ref Range   Lipase 180 (H) 11 - 51 U/L  Comprehensive metabolic panel     Status: Abnormal   Collection Time: 10/31/17  4:07 AM  Result Value Ref Range   Sodium 136 135 - 145 mmol/L   Potassium 3.8 3.5 - 5.1 mmol/L   Chloride 101 101 - 111 mmol/L   CO2 25 22 - 32 mmol/L   Glucose, Bld 212 (H) 65 - 99 mg/dL   BUN 16 6 - 20 mg/dL   Creatinine, Ser 0.55 0.44  - 1.00 mg/dL   Calcium 9.6 8.9 - 10.3 mg/dL   Total Protein 7.0 6.5 - 8.1 g/dL   Albumin 4.1 3.5 - 5.0 g/dL   AST 24 15 - 41 U/L   ALT 20 14 - 54 U/L   Alkaline Phosphatase 41 38 - 126 U/L   Total Bilirubin 0.8 0.3 - 1.2 mg/dL   GFR calc non Af Amer >60 >60 mL/min   GFR calc Af Amer >60 >60 mL/min   Anion gap 10 5 - 15  CBC     Status: Abnormal   Collection Time: 10/31/17  4:07 AM  Result Value Ref Range   WBC 22.3 (H) 4.0 -  10.5 K/uL   RBC 4.24 3.87 - 5.11 MIL/uL   Hemoglobin 13.2 12.0 - 15.0 g/dL   HCT 39.7 36.0 - 46.0 %   MCV 93.6 78.0 - 100.0 fL   MCH 31.1 26.0 - 34.0 pg   MCHC 33.2 30.0 - 36.0 g/dL   RDW 13.7 11.5 - 15.5 %   Platelets 300 150 - 400 K/uL  Differential     Status: Abnormal   Collection Time: 10/31/17  4:07 AM  Result Value Ref Range   Neutrophils Relative % 82 %   Neutro Abs 18.3 (H) 1.7 - 7.7 K/uL   Lymphocytes Relative 9 %   Lymphs Abs 2.0 0.7 - 4.0 K/uL   Monocytes Relative 8 %   Monocytes Absolute 1.7 (H) 0.1 - 1.0 K/uL   Eosinophils Relative 1 %   Eosinophils Absolute 0.2 0.0 - 0.7 K/uL   Basophils Relative 0 %   Basophils Absolute 0.0 0.0 - 0.1 K/uL   Imaging Studies: Ct Abdomen Pelvis W Contrast  Result Date: 10/31/2017 CLINICAL DATA:  Lower abdominal pain and bloating. EXAM: CT ABDOMEN AND PELVIS WITH CONTRAST TECHNIQUE: Multidetector CT imaging of the abdomen and pelvis was performed using the standard protocol following bolus administration of intravenous contrast. CONTRAST:  178mL ISOVUE-300 IOPAMIDOL (ISOVUE-300) INJECTION 61% COMPARISON:  CT 04/02/2016 FINDINGS: Lower chest: Emphysema at the lung bases. Remote right lateral rib fracture. Coronary artery calcifications and distal thoracic aortic atherosclerosis. Hepatobiliary: 9 mm cyst in the right lobe of the liver. No suspicious hepatic lesion. Gallbladder physiologically distended, no calcified stone. No biliary dilatation. Pancreas: No ductal dilatation or inflammation. Spleen: Normal in  size without focal abnormality. Adrenals/Urinary Tract: No adrenal nodule. 7 mm stone in the left proximal ureter with mild left hydronephrosis. No significant perinephric edema. Nonobstructing 9 mm calculus in the upper left kidney. No right hydronephrosis or stone. Minimal cortical scarring in the posterior right kidney. Urinary bladder is physiologically distended. No bladder wall thickening. Stomach/Bowel: Acute diverticulitis with inflamed diverticulum involving the mid descending colon with pericolonic stranding and small amount of free fluid. Tiny focus of extraluminal air is best appreciated on coronal image 53. No abscess. There is colonic wall thickening and fat stranding. Multiple noninflamed diverticular seen from the transverse colon distally. The stomach is nondistended. No small bowel inflammation, wall thickening or obstruction. Appendix not visualized, surgically absent per history. Vascular/Lymphatic: Advanced aortic and branch atherosclerosis. No aneurysm. No enlarged abdominal or pelvic lymph nodes. Prominent left ovarian vein. Reproductive: Prominent left adnexal vascularity. No adnexal mass. Uterus is quiescent, normal for age. Other: Tiny focus of extraluminal air adjacent to diverticular inflammation. No free air elsewhere. No intra-abdominal abscess or ascites. Musculoskeletal: There are no acute or suspicious osseous abnormalities. Postsurgical change at L4-L5. Degenerative change of both hips. IMPRESSION: 1. Acute descending colonic diverticulitis. Micro perforation with small focus of extraluminal air. No abscess. 2. Partially obstructing 7 mm stone in the left proximal ureter with mild hydronephrosis. Additional nonobstructing stone in the upper left kidney. 3.  Advanced Aortic Atherosclerosis (ICD10-I70.0). 4.  Emphysema noted at the lung bases (ICD10-J43.9). These results were called by telephone at the time of interpretation on 10/31/2017 at 6:33 am to Dr. Shanon Rosser , who verbally  acknowledged these results. Electronically Signed   By: Jeb Levering M.D.   On: 10/31/2017 06:35    ED COURSE  Nursing notes and initial vitals signs, including pulse oximetry, reviewed.  Vitals:   10/31/17 0349 10/31/17 0430 10/31/17 0500 10/31/17 0530  BP: (!) 153/87 (!) 141/85 (!) 149/86 138/73  Pulse: 97 96 100 99  Resp: 18 (!) 27 14 16   Temp: 98.3 F (36.8 C)     TempSrc: Oral     SpO2: 93% 95% 94% 91%  Weight: 59 kg (130 lb)     Height: 5\' 5"  (1.651 m)      6:49 AM IV Cipro and Flagyl ordered for diverticulitis.  Given microperforation and complication of obstructing ureteral stone we will have the patient admitted.  6:55 AM Dr. Blaine Hamper of hospitalist service consulted.  PROCEDURES    ED DIAGNOSES     ICD-10-CM   1. Diverticulitis of large intestine with perforation without abscess or bleeding K57.20   2. Ureterolithiasis N20.1        Sophya Vanblarcom, MD 10/31/17 [phone]

## 2017-10-31 NOTE — Anesthesia Postprocedure Evaluation (Signed)
Anesthesia Post Note  Patient: Doris Wyatt  Procedure(s) Performed: CYSTOSCOPY WITH RETROGRADE PYELOGRAM/LEFT URETERAL STENT PLACEMENT (Left Ureter)     Patient location during evaluation: PACU Anesthesia Type: General Level of consciousness: awake and alert Pain management: pain level controlled Vital Signs Assessment: post-procedure vital signs reviewed and stable Respiratory status: spontaneous breathing, nonlabored ventilation, respiratory function stable and patient connected to nasal cannula oxygen Cardiovascular status: blood pressure returned to baseline and stable Postop Assessment: no apparent nausea or vomiting Anesthetic complications: no    Last Vitals:  Vitals:   10/31/17 1812 10/31/17 2211  BP: 122/68 121/70  Pulse: 80 82  Resp: 17 16  Temp: 37 C 36.9 C  SpO2: 100% 98%    Last Pain:  Vitals:   10/31/17 2211  TempSrc: Oral  PainSc:                  [name]

## 2017-10-31 NOTE — Discharge Instructions (Signed)

## 2017-10-31 NOTE — Anesthesia Preprocedure Evaluation (Signed)
Anesthesia Evaluation  Patient identified by MRN, date of birth, ID band Patient awake    Reviewed: Allergy & Precautions, NPO status , Patient's Chart, lab work & pertinent test results  History of Anesthesia Complications Negative for: history of anesthetic complications  Airway Mallampati: II  TM Distance: >3 FB Neck ROM: Full    Dental  (+) Poor Dentition,    Pulmonary shortness of breath, neg sleep apnea, COPD,  COPD inhaler, neg recent URI, former smoker, neg PE   breath sounds clear to auscultation       Cardiovascular hypertension, Pt. on medications (-) angina(-) Past MI and (-) CHF  Rhythm:Regular     Neuro/Psych negative neurological ROS  negative psych ROS   GI/Hepatic negative GI ROS, Neg liver ROS,   Endo/Other  diabetes, Type 2, Oral Hypoglycemic Agents  Renal/GU Renal disease     Musculoskeletal   Abdominal   Peds  Hematology   Anesthesia Other Findings   Reproductive/Obstetrics                             Anesthesia Physical Anesthesia Plan  ASA: II  Anesthesia Plan: General   Post-op Pain Management:    Induction: Intravenous  PONV Risk Score and Plan: 3 and Ondansetron and Dexamethasone  Airway Management Planned: LMA  Additional Equipment: None  Intra-op Plan:   Post-operative Plan: Extubation in OR  Informed Consent: I have reviewed the patients History and Physical, chart, labs and discussed the procedure including the risks, benefits and alternatives for the proposed anesthesia with the patient or authorized representative who has indicated his/her understanding and acceptance.   Dental advisory given  Plan Discussed with: CRNA and Surgeon  Anesthesia Plan Comments:         Anesthesia Quick Evaluation

## 2017-10-31 NOTE — ED Triage Notes (Signed)
Pt reports having new onset of lower abd pain and pt concerned for possible diverticulitis. Pt reports abd bloating. Pt reports taking Hydromorpone at appx 0100 with minimal relief.

## 2017-10-31 NOTE — Transfer of Care (Signed)
Immediate Anesthesia Transfer of Care Note  Patient: Doris Wyatt  Procedure(s) Performed: CYSTOSCOPY WITH RETROGRADE PYELOGRAM/LEFT URETERAL STENT PLACEMENT (Left Ureter)  Patient Location: PACU  Anesthesia Type:General  Level of Consciousness: awake, alert  and oriented  Airway & Oxygen Therapy: Patient Spontanous Breathing and Patient connected to face mask oxygen  Post-op Assessment: Report given to RN and Post -op Vital signs reviewed and stable  Post vital signs: Reviewed and stable  Last Vitals:  Vitals:   10/31/17 0900 10/31/17 1000  BP: 130/67 (!) 131/55  Pulse: 95 90  Resp: 16 17  Temp:    SpO2: 92% 93%    Last Pain:  Vitals:   10/31/17 0822  TempSrc:   PainSc: 5          Complications: No apparent anesthesia complications

## 2017-10-31 NOTE — Progress Notes (Addendum)
Pharmacy Antibiotic Note  Doris Wyatt is a 71 y.o. female admitted on 10/31/2017 with UTI and intra-abdominal infection.  Pharmacy has been consulted for Unasyn dosing.  Plan:  Unasyn 3 gr IV q6h   Monitor clinical course, renal function, cultures as available   Height: 5\' 5"  (165.1 cm) Weight: 130 lb (59 kg) IBW/kg (Calculated) : 57  Temp (24hrs), Avg:98.3 F (36.8 C), Min:98.3 F (36.8 C), Max:98.3 F (36.8 C)  Recent Labs  Lab 10/31/17 0407  WBC 22.3*  CREATININE 0.55    Estimated Creatinine Clearance: 56.4 mL/min (by C-G formula based on SCr of 0.55 mg/dL).    No Known Allergies  Antimicrobials this admission: 12/2 ciprofloxacin x1 in ED  12/2 metrondiazole x 1 in ED 12/2 Unasyn  Dose adjustments this admission: ---  Microbiology results: 12/2 UCx: sent    Thank you for allowing pharmacy to be a part of this patient's care.  Royetta Asal, PharmD, BCPS Pager [phone] 10/31/2017 10:57 AM

## 2017-10-31 NOTE — Anesthesia Procedure Notes (Signed)
Procedure Name: LMA Insertion Date/Time: 10/31/2017 11:42 AM Performed by: Jeshua Ransford D, CRNA Pre-anesthesia Checklist: Patient identified, Emergency Drugs available, Suction available and Patient being monitored Patient Re-evaluated:Patient Re-evaluated prior to induction Oxygen Delivery Method: Circle system utilized Preoxygenation: Pre-oxygenation with 100% oxygen Induction Type: IV induction Ventilation: Mask ventilation without difficulty LMA: LMA inserted LMA Size: 4.0 Tube type: Oral Number of attempts: 1 Placement Confirmation: positive ETCO2 and breath sounds checked- equal and bilateral Tube secured with: Tape Dental Injury: Teeth and Oropharynx as per pre-operative assessment

## 2017-11-01 ENCOUNTER — Encounter (HOSPITAL_COMMUNITY): Payer: Self-pay | Admitting: Urology

## 2017-11-01 ENCOUNTER — Other Ambulatory Visit: Payer: Self-pay | Admitting: Urology

## 2017-11-01 DIAGNOSIS — N201 Calculus of ureter: Secondary | ICD-10-CM

## 2017-11-01 LAB — GLUCOSE, CAPILLARY
GLUCOSE-CAPILLARY: 199 mg/dL — AB (ref 65–99)
Glucose-Capillary: 142 mg/dL — ABNORMAL HIGH (ref 65–99)
Glucose-Capillary: 127 mg/dL — ABNORMAL HIGH (ref 65–99)
Glucose-Capillary: 162 mg/dL — ABNORMAL HIGH (ref 65–99)

## 2017-11-01 LAB — BASIC METABOLIC PANEL
ANION GAP: 8 (ref 5–15)
BUN: 8 mg/dL (ref 6–20)
CALCIUM: 8.1 mg/dL — AB (ref 8.9–10.3)
CO2: 23 mmol/L (ref 22–32)
Chloride: 109 mmol/L (ref 101–111)
Creatinine, Ser: 0.44 mg/dL (ref 0.44–1.00)
GLUCOSE: 187 mg/dL — AB (ref 65–99)
POTASSIUM: 3.8 mmol/L (ref 3.5–5.1)
Sodium: 140 mmol/L (ref 135–145)

## 2017-11-01 LAB — CBC
HEMATOCRIT: 33.8 % — AB (ref 36.0–46.0)
HEMOGLOBIN: 11.2 g/dL — AB (ref 12.0–15.0)
MCH: 31.4 pg (ref 26.0–34.0)
MCHC: 33.1 g/dL (ref 30.0–36.0)
MCV: 94.7 fL (ref 78.0–100.0)
Platelets: 236 10*3/uL (ref 150–400)
RBC: 3.57 MIL/uL — AB (ref 3.87–5.11)
RDW: 13.9 % (ref 11.5–15.5)
WBC: 12.8 10*3/uL — AB (ref 4.0–10.5)

## 2017-11-01 LAB — URINE CULTURE

## 2017-11-01 NOTE — Progress Notes (Signed)
Pt requesting daily meds and additional pain medication. Notified MD. Awaiting orders.

## 2017-11-01 NOTE — Plan of Care (Signed)
  Progressing Education: Knowledge of General Education information will improve 11/01/2017 0737 - Progressing by Milderd Meager, RN Health Behavior/Discharge Planning: Ability to manage health-related needs will improve 11/01/2017 0737 - Progressing by Milderd Meager, RN Clinical Measurements: Ability to maintain clinical measurements within normal limits will improve 11/01/2017 0737 - Progressing by Milderd Meager, RN Will remain free from infection 11/01/2017 0737 - Progressing by Milderd Meager, RN Diagnostic test results will improve 11/01/2017 0737 - Progressing by Milderd Meager, RN Respiratory complications will improve 11/01/2017 0737 - Progressing by Milderd Meager, RN Cardiovascular complication will be avoided 11/01/2017 0737 - Progressing by Milderd Meager, RN Activity: Risk for activity intolerance will decrease 11/01/2017 0737 - Progressing by Milderd Meager, RN Nutrition: Adequate nutrition will be maintained 11/01/2017 0737 - Progressing by Milderd Meager, RN Coping: Level of anxiety will decrease 11/01/2017 0737 - Progressing by Milderd Meager, RN Elimination: Will not experience complications related to bowel motility 11/01/2017 0737 - Progressing by Milderd Meager, RN Will not experience complications related to urinary retention 11/01/2017 0737 - Progressing by Milderd Meager, RN Pain Managment: General experience of comfort will improve 11/01/2017 0737 - Progressing by Milderd Meager, RN Safety: Ability to remain free from injury will improve 11/01/2017 0737 - Progressing by Milderd Meager, RN Skin Integrity: Risk for impaired skin integrity will decrease 11/01/2017 0737 - Progressing by Milderd Meager, RN Clinical Measurements: Postoperative complications will be avoided or minimized 11/01/2017 0737 - Progressing by Milderd Meager, RN Skin Integrity: Demonstration of wound healing without infection  will improve 11/01/2017 0737 - Progressing by Milderd Meager, RN

## 2017-11-01 NOTE — Progress Notes (Signed)
1 Day Post-Op   Assessment and Plan: Left UPJ stone s/p left ureteral stent insertion.    I will get her set up for next week for ureteroscopy.  She is ok for discharge from a GU standpoint.  Subjective: CC: left flank pain.  Hx: Doris Wyatt is POD #1 from left ureteral stenting for a left UPJ stone.  She has some persistent pain and bladder irritation.  She has some hematuria which is expected. She remains on Unasyn for diverticulitis that was also noted on her admission CT.  She has no fever or associated signs or symptoms.  Her WBC has declined.    ROS:  Review of Systems  Constitutional: Negative for chills and fever.  Gastrointestinal: Positive for abdominal pain. Negative for nausea and vomiting.  Genitourinary: Positive for flank pain, hematuria and urgency.  Neurological: Positive for focal weakness (left leg ).  All other systems reviewed and are negative.   Anti-infectives: Anti-infectives (From admission, onward)   Start     Dose/Rate Route Frequency Ordered Stop   10/31/17 1000  Ampicillin-Sulbactam (UNASYN) 3 g in sodium chloride 0.9 % 100 mL IVPB     3 g 200 mL/hr over 30 Minutes Intravenous Every 6 hours 10/31/17 0845     10/31/17 0645  ciprofloxacin (CIPRO) IVPB 400 mg     400 mg 200 mL/hr over 60 Minutes Intravenous  Once 10/31/17 0643 10/31/17 0928   10/31/17 0645  metroNIDAZOLE (FLAGYL) IVPB 500 mg     500 mg 100 mL/hr over 60 Minutes Intravenous  Once 10/31/17 6160 10/31/17 7371      Current Facility-Administered Medications  Medication Dose Route Frequency Provider Last Rate Last Dose  . acetaminophen (TYLENOL) tablet 650 mg  650 mg Oral Q6H PRN Phillips Grout, MD   650 mg at 10/31/17 2122   Or  . acetaminophen (TYLENOL) suppository 650 mg  650 mg Rectal Q6H PRN Phillips Grout, MD      . ALPRAZolam Duanne Moron) tablet 0.25 mg  0.25 mg Oral QHS PRN Phillips Grout, MD      . Ampicillin-Sulbactam (UNASYN) 3 g in sodium chloride 0.9 % 100 mL IVPB  3 g  Intravenous Q6H Glogovac, Nikola, RPH 200 mL/hr at 11/01/17 0409 3 g at 11/01/17 0409  . atorvastatin (LIPITOR) tablet 40 mg  40 mg Oral QPM Derrill Kay A, MD   40 mg at 10/31/17 1737  . HYDROcodone-acetaminophen (NORCO/VICODIN) 5-325 MG per tablet 1 tablet  1 tablet Oral Q6H PRN Phillips Grout, MD   1 tablet at 11/01/17 0019  . insulin aspart (novoLOG) injection 0-9 Units  0-9 Units Subcutaneous TID WC Phillips Grout, MD   2 Units at 10/31/17 1738  . ondansetron (ZOFRAN) tablet 4 mg  4 mg Oral Q6H PRN Phillips Grout, MD       Or  . ondansetron (ZOFRAN) injection 4 mg  4 mg Intravenous Q6H PRN Phillips Grout, MD         Objective: Vital signs in last 24 hours: Temp:  [97.4 F (36.3 C)-98.9 F (37.2 C)] 98.5 F (36.9 C) (12/03 0543) Pulse Rate:  [79-111] 79 (12/03 0543) Resp:  [12-23] 16 (12/03 0543) BP: (118-161)/(42-83) 118/42 (12/03 0543) SpO2:  [92 %-100 %] 92 % (12/03 0543)  Intake/Output from previous day: 12/02 0701 - 12/03 0700 In: 1267.5 [P.O.:240; I.V.:827.5; IV Piggyback:200] Out: 400 [Urine:400] Intake/Output this shift: No intake/output data recorded.   Physical Exam  Constitutional: She is well-developed, well-nourished,  and in no distress.  Cardiovascular: Normal rate and regular rhythm.  Pulmonary/Chest: Effort normal. No respiratory distress.  Abdominal: Soft. There is tenderness (LCVAT and LLQ mild. ).  Vitals reviewed.   Lab Results:  Recent Labs    10/31/17 0407 11/01/17 0506  WBC 22.3* 12.8*  HGB 13.2 11.2*  HCT 39.7 33.8*  PLT 300 236   BMET Recent Labs    10/31/17 0407 11/01/17 0506  NA 136 140  K 3.8 3.8  CL 101 109  CO2 25 23  GLUCOSE 212* 187*  BUN 16 8  CREATININE 0.55 0.44  CALCIUM 9.6 8.1*   PT/INR No results for input(s): LABPROT, INR in the last 72 hours. ABG No results for input(s): PHART, HCO3 in the last 72 hours.  Invalid input(s): PCO2, PO2  Studies/Results: Ct Abdomen Pelvis W Contrast  Result Date:  10/31/2017 CLINICAL DATA:  Lower abdominal pain and bloating. EXAM: CT ABDOMEN AND PELVIS WITH CONTRAST TECHNIQUE: Multidetector CT imaging of the abdomen and pelvis was performed using the standard protocol following bolus administration of intravenous contrast. CONTRAST:  180mL ISOVUE-300 IOPAMIDOL (ISOVUE-300) INJECTION 61% COMPARISON:  CT 04/02/2016 FINDINGS: Lower chest: Emphysema at the lung bases. Remote right lateral rib fracture. Coronary artery calcifications and distal thoracic aortic atherosclerosis. Hepatobiliary: 9 mm cyst in the right lobe of the liver. No suspicious hepatic lesion. Gallbladder physiologically distended, no calcified stone. No biliary dilatation. Pancreas: No ductal dilatation or inflammation. Spleen: Normal in size without focal abnormality. Adrenals/Urinary Tract: No adrenal nodule. 7 mm stone in the left proximal ureter with mild left hydronephrosis. No significant perinephric edema. Nonobstructing 9 mm calculus in the upper left kidney. No right hydronephrosis or stone. Minimal cortical scarring in the posterior right kidney. Urinary bladder is physiologically distended. No bladder wall thickening. Stomach/Bowel: Acute diverticulitis with inflamed diverticulum involving the mid descending colon with pericolonic stranding and small amount of free fluid. Tiny focus of extraluminal air is best appreciated on coronal image 53. No abscess. There is colonic wall thickening and fat stranding. Multiple noninflamed diverticular seen from the transverse colon distally. The stomach is nondistended. No small bowel inflammation, wall thickening or obstruction. Appendix not visualized, surgically absent per history. Vascular/Lymphatic: Advanced aortic and branch atherosclerosis. No aneurysm. No enlarged abdominal or pelvic lymph nodes. Prominent left ovarian vein. Reproductive: Prominent left adnexal vascularity. No adnexal mass. Uterus is quiescent, normal for age. Other: Tiny focus of  extraluminal air adjacent to diverticular inflammation. No free air elsewhere. No intra-abdominal abscess or ascites. Musculoskeletal: There are no acute or suspicious osseous abnormalities. Postsurgical change at L4-L5. Degenerative change of both hips. IMPRESSION: 1. Acute descending colonic diverticulitis. Micro perforation with small focus of extraluminal air. No abscess. 2. Partially obstructing 7 mm stone in the left proximal ureter with mild hydronephrosis. Additional nonobstructing stone in the upper left kidney. 3.  Advanced Aortic Atherosclerosis (ICD10-I70.0). 4.  Emphysema noted at the lung bases (ICD10-J43.9). These results were called by telephone at the time of interpretation on 10/31/2017 at 6:33 am to Dr. Shanon Rosser , who verbally acknowledged these results. Electronically Signed   By: Jeb Levering M.D.   On: 10/31/2017 06:35   Dg C-arm 1-60 Min-no Report  Result Date: 10/31/2017 Fluoroscopy was utilized by the requesting physician.  No radiographic interpretation.    I have reviewed her am labs.           LOS: 1 day    Irine Seal 11/01/2017 [phone] ID: Doris Wyatt, female   DOB:  Oct 21, 1946, 71 y.o.   MRN: [id number]

## 2017-11-01 NOTE — Progress Notes (Signed)
[facility] PROGRESS NOTE  Doris Wyatt NTI:[id number] DOB: Nov 02, 1946 DOA: 10/31/2017 PCP: Leighton Ruff, MD  Interim summary and HPI 71 y.o. female with medical history significant of COPD, DM, HTN, HLD comes in with worsening llq abdominal pain and lower back pain more on the left.  Pt reports she was treated with cipro and flagyl as an outpatient and completed these abx within the last week for acute diverticulitis.  She improved and was doing better.  Than about 2 days ago pain came back and it was somewhat the same but different.  Before she was only having left lower abominal pain, when it came back it was not only in the LLQ but also in the back.  She denies any urinary changes.  No dysuria or hematuria. No radiation of the pain.  No fevers.  No n/v/d.  Pt found to have on CT scan both a left partially obstructing ureter stone with left hydronephrosis but also diverticulitis with microperforation.  She is referred for admission for iv abx and further evaluation of the etiology of her pain.   Assessment/Plan: 1-LLQ abd pain/diverticulitis with microperforation -improving -still having pain -no fever and WBC's trending down -reports feeling hungry -will continue abx's, slowly advance diet ans follow clinical response -continue PRN antiemetics and continue PRN analgesics  2-left UPJ -s/p left ureteral stent -patient with some CVA tenderness and mild hematuria (expected after procedure) -cover with current antibiotic therapy  3-HTN -BP stable (despite pain) -will continue current antihypertensive regimen   4-type 2 diabetes -continue SSI -hold oral hypoglycemic regimen while inpatient  5-HLD -continue statin  6-hx of COPD -no wheezing -condition is stable -will use as needed nebulizer.   Code Status: full Family Communication: granddaughter at bedside  Disposition Plan: continue IV antibiotics, slowly advance diet, continue PRN analgesics, follow clinical  response.   Consultants:  Urology   Procedures:  S/p left ureteral stent insertion 12/2  Antibiotics:  unasyn 12/2  HPI/Subjective: No fever, no CP, no SOB. Patient w/o nausea or vomiting. Still complaining of left lower quadrant pain and left flank pain.  Objective: Vitals:   11/01/17 1416 11/01/17 2111  BP: (!) 146/78 (!) 148/82  Pulse: (!) 108 93  Resp: 18 18  Temp: 98.3 F (36.8 C) 98.1 F (36.7 C)  SpO2:  100%    Intake/Output Summary (Last 24 hours) at 11/01/2017 2255 Last data filed at 11/01/2017 2100 Gross per 24 hour  Intake 720 ml  Output 1200 ml  Net -480 ml   Filed Weights   10/31/17 0349  Weight: 59 kg (130 lb)    Exam:   General:  Afebrile, no CP, no nausea or vomiting. Complaining of left flank and LLQ pain. denies SOB.  Cardiovascular: S1 and S2, no rubs, no gallops  Respiratory: no wheezing, no crackles, normal resp effort  Abdomen: soft, ND, positive BS; tenderness to palpation to her left flank and LLQ.  Musculoskeletal: no edema, no cyanosis    Data Reviewed: Basic Metabolic Panel: Recent Labs  Lab 10/31/17 0407 11/01/17 0506  NA 136 140  K 3.8 3.8  CL 101 109  CO2 25 23  GLUCOSE 212* 187*  BUN 16 8  CREATININE 0.55 0.44  CALCIUM 9.6 8.1*   Liver Function Tests: Recent Labs  Lab 10/31/17 0407  AST 24  ALT 20  ALKPHOS 41  BILITOT 0.8  PROT 7.0  ALBUMIN 4.1   Recent Labs  Lab 10/31/17 0407  LIPASE 180*   CBC: Recent Labs  Lab 10/31/17 0407 11/01/17 0506  WBC 22.3* 12.8*  NEUTROABS 18.3*  --   HGB 13.2 11.2*  HCT 39.7 33.8*  MCV 93.6 94.7  PLT 300 236   CBG: Recent Labs  Lab 10/31/17 1609 11/01/17 0752 11/01/17 1159 11/01/17 1643 11/01/17 2232  GLUCAP 194* 199* 142* 127* 162*    Recent Results (from the past 240 hour(s))  Culture, Urine     Status: Abnormal   Collection Time: 10/31/17  9:33 AM  Result Value Ref Range Status   Specimen Description URINE, CLEAN CATCH  Final   Special  Requests NONE  Final   Culture (A)  Final    <10,000 COLONIES/mL INSIGNIFICANT GROWTH Performed at [facility], [place] [address] [place], [place] [address]    Report Status 11/01/2017 FINAL  Final     Studies: Ct Abdomen Pelvis W Contrast  Result Date: 10/31/2017 CLINICAL DATA:  Lower abdominal pain and bloating. EXAM: CT ABDOMEN AND PELVIS WITH CONTRAST TECHNIQUE: Multidetector CT imaging of the abdomen and pelvis was performed using the standard protocol following bolus administration of intravenous contrast. CONTRAST:  143mL ISOVUE-300 IOPAMIDOL (ISOVUE-300) INJECTION 61% COMPARISON:  CT 04/02/2016 FINDINGS: Lower chest: Emphysema at the lung bases. Remote right lateral rib fracture. Coronary artery calcifications and distal thoracic aortic atherosclerosis. Hepatobiliary: 9 mm cyst in the right lobe of the liver. No suspicious hepatic lesion. Gallbladder physiologically distended, no calcified stone. No biliary dilatation. Pancreas: No ductal dilatation or inflammation. Spleen: Normal in size without focal abnormality. Adrenals/Urinary Tract: No adrenal nodule. 7 mm stone in the left proximal ureter with mild left hydronephrosis. No significant perinephric edema. Nonobstructing 9 mm calculus in the upper left kidney. No right hydronephrosis or stone. Minimal cortical scarring in the posterior right kidney. Urinary bladder is physiologically distended. No bladder wall thickening. Stomach/Bowel: Acute diverticulitis with inflamed diverticulum involving the mid descending colon with pericolonic stranding and small amount of free fluid. Tiny focus of extraluminal air is best appreciated on coronal image 53. No abscess. There is colonic wall thickening and fat stranding. Multiple noninflamed diverticular seen from the transverse colon distally. The stomach is nondistended. No small bowel inflammation, wall thickening or obstruction. Appendix not visualized, surgically absent per history.  Vascular/Lymphatic: Advanced aortic and branch atherosclerosis. No aneurysm. No enlarged abdominal or pelvic lymph nodes. Prominent left ovarian vein. Reproductive: Prominent left adnexal vascularity. No adnexal mass. Uterus is quiescent, normal for age. Other: Tiny focus of extraluminal air adjacent to diverticular inflammation. No free air elsewhere. No intra-abdominal abscess or ascites. Musculoskeletal: There are no acute or suspicious osseous abnormalities. Postsurgical change at L4-L5. Degenerative change of both hips. IMPRESSION: 1. Acute descending colonic diverticulitis. Micro perforation with small focus of extraluminal air. No abscess. 2. Partially obstructing 7 mm stone in the left proximal ureter with mild hydronephrosis. Additional nonobstructing stone in the upper left kidney. 3.  Advanced Aortic Atherosclerosis (ICD10-I70.0). 4.  Emphysema noted at the lung bases (ICD10-J43.9). These results were called by telephone at the time of interpretation on 10/31/2017 at 6:33 am to Dr. Shanon Rosser , who verbally acknowledged these results. Electronically Signed   By: Jeb Levering M.D.   On: 10/31/2017 06:35   Dg C-arm 1-60 Min-no Report  Result Date: 10/31/2017 Fluoroscopy was utilized by the requesting physician.  No radiographic interpretation.    Scheduled Meds: . atorvastatin  40 mg Oral QPM  . insulin aspart  0-9 Units Subcutaneous TID WC   Continuous Infusions: . ampicillin-sulbactam (UNASYN) IV 3 g (  11/01/17 2246)     Time spent: 30 minutes   [facility] Pager [phone]. If 7PM-7AM, please contact night-coverage at [email or web address], password [facility] 11/01/2017, 10:55 PM  LOS: 1 day

## 2017-11-02 LAB — GLUCOSE, CAPILLARY
GLUCOSE-CAPILLARY: 170 mg/dL — AB (ref 65–99)
GLUCOSE-CAPILLARY: 132 mg/dL — AB (ref 65–99)
GLUCOSE-CAPILLARY: 158 mg/dL — AB (ref 65–99)
Glucose-Capillary: 144 mg/dL — ABNORMAL HIGH (ref 65–99)

## 2017-11-02 MED ORDER — POLYETHYLENE GLYCOL 3350 17 G PO PACK
17.0000 g | PACK | Freq: Every day | ORAL | Status: DC
  Administered 2017-11-03 – 2017-11-04 (×2): 17 g via ORAL
  Filled 2017-11-02 (×2): qty 1

## 2017-11-02 MED ORDER — OXYBUTYNIN CHLORIDE 5 MG PO TABS
5.0000 mg | ORAL_TABLET | Freq: Three times a day (TID) | ORAL | Status: DC
  Administered 2017-11-02 – 2017-11-04 (×5): 5 mg via ORAL
  Filled 2017-11-02 (×5): qty 1

## 2017-11-02 MED ORDER — DOCUSATE SODIUM 100 MG PO CAPS
100.0000 mg | ORAL_CAPSULE | Freq: Two times a day (BID) | ORAL | Status: DC
  Administered 2017-11-02 – 2017-11-04 (×4): 100 mg via ORAL
  Filled 2017-11-02 (×4): qty 1

## 2017-11-02 NOTE — Progress Notes (Signed)
[facility] PROGRESS NOTE  Doris Wyatt BJY:[id number] DOB: Apr 15, 1946 DOA: 10/31/2017 PCP: Leighton Ruff, MD  Interim summary and HPI 71 y.o. female with medical history significant of COPD, DM, HTN, HLD comes in with worsening llq abdominal pain and lower back pain more on the left.  Pt reports she was treated with cipro and flagyl as an outpatient and completed these abx within the last week for acute diverticulitis.  She improved and was doing better.  Than about 2 days ago pain came back and it was somewhat the same but different.  Before she was only having left lower abominal pain, when it came back it was not only in the LLQ but also in the back.  She denies any urinary changes.  No dysuria or hematuria. No radiation of the pain.  No fevers.  No n/v/d.  Pt found to have on CT scan both a left partially obstructing ureter stone with left hydronephrosis but also diverticulitis with microperforation.  She is referred for admission for iv abx and further evaluation of the etiology of her pain.   Assessment/Plan: 1-LLQ abd pain/diverticulitis with microperforation -improving -still having pain significant pain in her left lower quadrant and left flank -no fever and WBC's trending down -Continue current antibiotics and continue slowly advancing diet. -Will check abdominal x-ray in a.m.  -Continue PRN antiemetics and analgesics. -Will have physical therapy evaluate and patient.  Continue PRN antiemetics and continue PRN analgesics  2-left UPJ -s/p left ureteral stent -Patient continued to have CVA tenderness, continue to have hematuria (she reports a lot) and is now also having spasm into her left flank and suprapubic area. -Continue current antibiotics -Will start patient on Ditropan.  3-HTN -Blood pressure has remained stable. -Continue current antihypertensive regimen.  4-type 2 diabetes -Continue sliding scale insulin  -Continue holding hypoglycemic regimen by  mouth while inpatient.    5-HLD -Continue statin  6-hx of COPD -no wheezing -condition is stable and well controlled -Continue as needed nebulizer   Code Status: full Family Communication: granddaughter at bedside  Disposition Plan: continue IV antibiotics, slowly advance diet, continue PRN analgesics, follow clinical response.  Check abdominal x-ray in a.m.   Consultants:  Urology   Procedures:  S/p left ureteral stent insertion 12/2  Antibiotics:  unasyn 12/2  HPI/Subjective: No fever, no chest pain, no shortness of breath.  Patient without nausea vomiting.  Reports significant pain in her left lower quadrant and also left flank pain.  Having some difficulty to move her left leg associated with pain and is just not feeling ready for discharge.  Patient continued to have a lot hematuria as well.  Objective: Vitals:   11/02/17 0900 11/02/17 1400  BP: (!) 162/69 (!) 156/79  Pulse: 75 90  Resp: 16 16  Temp: 98 F (36.7 C) 98.1 F (36.7 C)  SpO2: 94% 98%    Intake/Output Summary (Last 24 hours) at 11/02/2017 1900 Last data filed at 11/02/2017 1800 Gross per 24 hour  Intake 910 ml  Output 4375 ml  Net -3465 ml   Filed Weights   10/31/17 0349  Weight: 59 kg (130 lb)    Exam:   General: No fever, no chest pain, no nausea vomiting.  Continued to experience significant left flank and left lower quadrant pain.  Denies bowel movement.  Reporting hematuria.   Cardiovascular: S1 and S2, no rubs, no gallops   Respiratory: Normal respiratory effort, no wheezing, no crackles  Abdomen: Soft, nondistended, positive bowel sounds; tenderness to  palpation in the left lower quadrant and also in her left CVA; mild guarding appreciated.    Musculoskeletal: No edema, no cyanosis, no clubbing.      Data Reviewed: Basic Metabolic Panel: Recent Labs  Lab 10/31/17 0407 11/01/17 0506  NA 136 140  K 3.8 3.8  CL 101 109  CO2 25 23  GLUCOSE 212* 187*  BUN 16 8   CREATININE 0.55 0.44  CALCIUM 9.6 8.1*   Liver Function Tests: Recent Labs  Lab 10/31/17 0407  AST 24  ALT 20  ALKPHOS 41  BILITOT 0.8  PROT 7.0  ALBUMIN 4.1   Recent Labs  Lab 10/31/17 0407  LIPASE 180*   CBC: Recent Labs  Lab 10/31/17 0407 11/01/17 0506  WBC 22.3* 12.8*  NEUTROABS 18.3*  --   HGB 13.2 11.2*  HCT 39.7 33.8*  MCV 93.6 94.7  PLT 300 236   CBG: Recent Labs  Lab 11/01/17 1643 11/01/17 2232 11/02/17 0808 11/02/17 1205 11/02/17 1704  GLUCAP 127* 162* 170* 132* 158*    Recent Results (from the past 240 hour(s))  Culture, Urine     Status: Abnormal   Collection Time: 10/31/17  9:33 AM  Result Value Ref Range Status   Specimen Description URINE, CLEAN CATCH  Final   Special Requests NONE  Final   Culture (A)  Final    <10,000 COLONIES/mL INSIGNIFICANT GROWTH Performed at [facility], [place] [address] [place], [place] [address]    Report Status 11/01/2017 FINAL  Final     Studies: No results found.  Scheduled Meds: . atorvastatin  40 mg Oral QPM  . insulin aspart  0-9 Units Subcutaneous TID WC  . oxybutynin  5 mg Oral TID   Continuous Infusions: . ampicillin-sulbactam (UNASYN) IV Stopped (11/02/17 1800)     Time spent: 30 minutes   [facility] Pager [phone]. If 7PM-7AM, please contact night-coverage at [email or web address], password [facility] 11/02/2017, 7:00 PM  LOS: 2 days

## 2017-11-02 NOTE — Progress Notes (Signed)
2 Days Post-Op  Subjective: CC: Left abdominal pain.  Hx:   [name] is POD 2 from cystoscopy and left ureteral stenting for a 13x57mm left UPJ stone.   She has left flank pain but still has some frequency and hematuria from the stent.   She continues to have LLQ pain with radiation into the left anterior thigh which is most likely from the diverticulitis.  ROS:  Review of Systems  Constitutional: Negative for fever.  Gastrointestinal: Positive for abdominal pain. Negative for nausea.  Genitourinary: Positive for hematuria.  Musculoskeletal:       She has pain referred into the left anterior upper leg.     Anti-infectives: Anti-infectives (From admission, onward)   Start     Dose/Rate Route Frequency Ordered Stop   10/31/17 1000  Ampicillin-Sulbactam (UNASYN) 3 g in sodium chloride 0.9 % 100 mL IVPB     3 g 200 mL/hr over 30 Minutes Intravenous Every 6 hours 10/31/17 0845     10/31/17 0645  ciprofloxacin (CIPRO) IVPB 400 mg     400 mg 200 mL/hr over 60 Minutes Intravenous  Once 10/31/17 0643 10/31/17 0928   10/31/17 0645  metroNIDAZOLE (FLAGYL) IVPB 500 mg     500 mg 100 mL/hr over 60 Minutes Intravenous  Once 10/31/17 1937 10/31/17 9024      Current Facility-Administered Medications  Medication Dose Route Frequency Provider Last Rate Last Dose  . acetaminophen (TYLENOL) tablet 650 mg  650 mg Oral Q6H PRN Phillips Grout, MD   650 mg at 11/02/17 0973   Or  . acetaminophen (TYLENOL) suppository 650 mg  650 mg Rectal Q6H PRN Phillips Grout, MD      . ALPRAZolam Duanne Moron) tablet 0.25 mg  0.25 mg Oral QHS PRN Phillips Grout, MD   0.25 mg at 11/02/17 0618  . Ampicillin-Sulbactam (UNASYN) 3 g in sodium chloride 0.9 % 100 mL IVPB  3 g Intravenous Q6H Glogovac, Nikola, RPH 200 mL/hr at 11/02/17 0617 3 g at 11/02/17 0617  . atorvastatin (LIPITOR) tablet 40 mg  40 mg Oral QPM Derrill Kay A, MD   40 mg at 11/01/17 1635  . HYDROcodone-acetaminophen (NORCO/VICODIN) 5-325 MG per tablet 1  tablet  1 tablet Oral Q6H PRN Phillips Grout, MD   1 tablet at 11/01/17 2242  . insulin aspart (novoLOG) injection 0-9 Units  0-9 Units Subcutaneous TID WC Phillips Grout, MD   1 Units at 11/01/17 1227  . ondansetron (ZOFRAN) tablet 4 mg  4 mg Oral Q6H PRN Phillips Grout, MD       Or  . ondansetron (ZOFRAN) injection 4 mg  4 mg Intravenous Q6H PRN Phillips Grout, MD         Objective: Vital signs in last 24 hours: Temp:  [98.1 F (36.7 C)-98.4 F (36.9 C)] 98.4 F (36.9 C) (12/04 0525) Pulse Rate:  [81-108] 81 (12/04 0600) Resp:  [18] 18 (12/04 0525) BP: (146-183)/(75-82) 168/75 (12/04 0600) SpO2:  [100 %] 100 % (12/04 0525)  Intake/Output from previous day: 12/03 0701 - 12/04 0700 In: 1090 [P.O.:840; IV Piggyback:250] Out: 3900 [Urine:3900] Intake/Output this shift: No intake/output data recorded.   Physical Exam  Constitutional: She is well-developed, well-nourished, and in no distress.  Cardiovascular: Normal rate and regular rhythm.  Pulmonary/Chest: Effort normal. No respiratory distress.  Abdominal: Soft. There is tenderness (LLQ).  Vitals reviewed.   Lab Results:  Recent Labs    10/31/17 0407 11/01/17 0506  WBC 22.3*  12.8*  HGB 13.2 11.2*  HCT 39.7 33.8*  PLT 300 236   BMET Recent Labs    10/31/17 0407 11/01/17 0506  NA 136 140  K 3.8 3.8  CL 101 109  CO2 25 23  GLUCOSE 212* 187*  BUN 16 8  CREATININE 0.55 0.44  CALCIUM 9.6 8.1*   PT/INR No results for input(s): LABPROT, INR in the last 72 hours. ABG No results for input(s): PHART, HCO3 in the last 72 hours.  Invalid input(s): PCO2, PO2  Studies/Results: Dg C-arm 1-60 Min-no Report  Result Date: 10/31/2017 Fluoroscopy was utilized by the requesting physician.  No radiographic interpretation.     Assessment and Plan: Left UPJ stone with obstruction.   She has less left flank pain but still has hematuria from the stent.    She is scheduled for left ureteroscopy with laser on 11/09/17  at 7:30 in the Main OR.   Diverticulitis.  She continues to have pain in the LLQ with some referred pain into the left leg.  She doesn't feel she is ready for discharge.       LOS: 2 days    Irine Seal 11/02/2017 [phone] ID: [name], female   DOB: 19-Dec-1945, 71 y.o.   MRN: [id number]

## 2017-11-03 ENCOUNTER — Inpatient Hospital Stay (HOSPITAL_COMMUNITY): Payer: Medicare HMO

## 2017-11-03 DIAGNOSIS — E119 Type 2 diabetes mellitus without complications: Secondary | ICD-10-CM

## 2017-11-03 DIAGNOSIS — J449 Chronic obstructive pulmonary disease, unspecified: Secondary | ICD-10-CM

## 2017-11-03 DIAGNOSIS — R1032 Left lower quadrant pain: Secondary | ICD-10-CM

## 2017-11-03 DIAGNOSIS — K5720 Diverticulitis of large intestine with perforation and abscess without bleeding: Secondary | ICD-10-CM

## 2017-11-03 LAB — BASIC METABOLIC PANEL
Anion gap: 6 (ref 5–15)
BUN: 11 mg/dL (ref 6–20)
CHLORIDE: 109 mmol/L (ref 101–111)
CO2: 26 mmol/L (ref 22–32)
CREATININE: 0.5 mg/dL (ref 0.44–1.00)
Calcium: 8.5 mg/dL — ABNORMAL LOW (ref 8.9–10.3)
GFR calc Af Amer: >60 mL/min (ref >60)
GFR calc non Af Amer: >60 mL/min (ref >60)
Glucose, Bld: 178 mg/dL — ABNORMAL HIGH (ref 65–99)
Potassium: 3.8 mmol/L (ref 3.5–5.1)
Sodium: 141 mmol/L (ref 135–145)

## 2017-11-03 LAB — CBC
HEMATOCRIT: 37.2 % (ref 36.0–46.0)
HEMOGLOBIN: 12.3 g/dL (ref 12.0–15.0)
MCH: 30.8 pg (ref 26.0–34.0)
MCHC: 33.1 g/dL (ref 30.0–36.0)
MCV: 93 fL (ref 78.0–100.0)
Platelets: 235 10*3/uL (ref 150–400)
RBC: 4 MIL/uL (ref 3.87–5.11)
RDW: 13.4 % (ref 11.5–15.5)
WBC: 6.2 10*3/uL (ref 4.0–10.5)

## 2017-11-03 LAB — GLUCOSE, CAPILLARY
GLUCOSE-CAPILLARY: 154 mg/dL — AB (ref 65–99)
GLUCOSE-CAPILLARY: 218 mg/dL — AB (ref 65–99)
Glucose-Capillary: 160 mg/dL — ABNORMAL HIGH (ref 65–99)
Glucose-Capillary: 129 mg/dL — ABNORMAL HIGH (ref 65–99)

## 2017-11-03 MED ORDER — IRBESARTAN 75 MG PO TABS
75.0000 mg | ORAL_TABLET | Freq: Every day | ORAL | Status: DC
Start: 2017-11-03 — End: 2017-11-04
  Administered 2017-11-03 – 2017-11-04 (×2): 75 mg via ORAL
  Filled 2017-11-03: qty 1

## 2017-11-03 MED ORDER — MOMETASONE FURO-FORMOTEROL FUM 200-5 MCG/ACT IN AERO
2.0000 | INHALATION_SPRAY | Freq: Two times a day (BID) | RESPIRATORY_TRACT | Status: DC
  Administered 2017-11-04: 2 via RESPIRATORY_TRACT
  Filled 2017-11-03: qty 8.8

## 2017-11-03 NOTE — Care Management Important Message (Signed)
Important Message  Patient Details  Name: AMETHYST GAINER MRN: [id number] Date of Birth: 10/07/1946   Medicare Important Message Given:  Yes    Kerin Salen 11/03/2017, 10:00 AMImportant Message  Patient Details  Name: BRYLYN NOVAKOVICH MRN: [id number] Date of Birth: [birth year]   Medicare Important Message Given:  Yes    Kerin Salen 11/03/2017, 10:00 AM

## 2017-11-03 NOTE — Evaluation (Signed)
Physical Therapy Evaluation Patient Details Name: Doris Wyatt MRN: [id number] DOB: June 09, 1946 Today's Date: 11/03/2017   History of Present Illness  71 y.o. female with medical history significant of COPD, DM, HTN, HLD comes in with worsening llq abdominal pain and lower back pain more on the left.  Pt reports she was treated with cipro and flagyl as an outpatient and completed these abx within the last week for acute diverticulitis.  She improved and was doing better.  Than about 2 days ago pain came back. Dx of diverticulitis with microperforation and obstructing ureter stone with L hydronephrosis.  Clinical Impression  Pt is independent with mobility and is ready to DC home from PT standpoint. She ambulated 400' without an assistive device, no loss of balance. No further PT indicated, will sign off.     Follow Up Recommendations No PT follow up    Equipment Recommendations  None recommended by PT    Recommendations for Other Services       Precautions / Restrictions Precautions Precautions: None Restrictions Weight Bearing Restrictions: No      Mobility  Bed Mobility Overal bed mobility: Independent                Transfers Overall transfer level: Independent                  Ambulation/Gait Ambulation/Gait assistance: Independent Ambulation Distance (Feet): 400 Feet Assistive device: None Gait Pattern/deviations: WFL(Within Functional Limits)   Gait velocity interpretation: at or above normal speed for age/gender General Gait Details: steady, no loss of balance  Stairs            Wheelchair Mobility    Modified Rankin (Stroke Patients Only)       Balance Overall balance assessment: Independent                                           Pertinent Vitals/Pain Pain Assessment: 0-10 Pain Score: 2  Pain Location: L low back Pain Descriptors / Indicators: Discomfort Pain Intervention(s): Monitored during session     Home Living Family/patient expects to be discharged to:: Private residence Living Arrangements: Alone Available Help at Discharge: Family;Available PRN/intermittently;Neighbor Type of Home: Apartment Home Access: Stairs to enter Entrance Stairs-Rails: Right Entrance Stairs-Number of Steps: flight Home Layout: One level        Prior Function Level of Independence: Independent         Comments: walks her 2 dogs, walks to stores     Hand Dominance        Extremity/Trunk Assessment   Upper Extremity Assessment Upper Extremity Assessment: Overall WFL for tasks assessed    Lower Extremity Assessment Lower Extremity Assessment: Overall WFL for tasks assessed    Cervical / Trunk Assessment Cervical / Trunk Assessment: Normal  Communication   Communication: No difficulties  Cognition Arousal/Alertness: Awake/alert Behavior During Therapy: WFL for tasks assessed/performed Overall Cognitive Status: Within Functional Limits for tasks assessed                                        General Comments      Exercises     Assessment/Plan    PT Assessment Patent does not need any further PT services  PT Problem List  PT Treatment Interventions      PT Goals (Current goals can be found in the Care Plan section)  Acute Rehab PT Goals PT Goal Formulation: All assessment and education complete, DC therapy    Frequency     Barriers to discharge        Co-evaluation               AM-PAC PT "6 Clicks" Daily Activity  Outcome Measure Difficulty turning over in bed (including adjusting bedclothes, sheets and blankets)?: None Difficulty moving from lying on back to sitting on the side of the bed? : None Difficulty sitting down on and standing up from a chair with arms (e.g., wheelchair, bedside commode, etc,.)?: None Help needed moving to and from a bed to chair (including a wheelchair)?: None Help needed walking in hospital room?:  None Help needed climbing 3-5 steps with a railing? : None 6 Click Score: 24    End of Session Equipment Utilized During Treatment: Gait belt Activity Tolerance: Patient tolerated treatment well Patient left: in chair;with nursing/sitter in room;with call bell/phone within reach Nurse Communication: Mobility status      Time: [id number] PT Time Calculation (min) (ACUTE ONLY): 12 min   Charges:   PT Evaluation $PT Eval Low Complexity: 1 Low     PT G Codes:         Philomena Doheny 11/03/2017, 10:51 AM [phone]

## 2017-11-03 NOTE — Progress Notes (Signed)
PROGRESS NOTE    Doris Wyatt  BMW:[id number] DOB: Jun 20, 1946 DOA: 10/31/2017 PCP: Juluis Rainier, MD   Brief Narrative: 71 year old female with history of COPD, diabetes, hypertension, hyperlipidemia presented with worsening left lower quadrant pain associated with nausea and vomiting.  The patient was treated with Cipro and Flagyl as an outpatient and completed antibiotics prior to admission.  She was initially doing better but 2 days prior to admission the pain returned.  CT scan of abdomen pelvis consistent with a left partially obstructing ureteral stone with left hydronephrosis and also diverticulitis with microperforation.  Patient was admitted for IV antibiotics and further evaluation.  Assessment & Plan:  #Left lower quadrant abdominal pain/descending colon diverticulitis with microperforation: Continue IV Unasyn.  Clinically improving.  No fever.  Tolerating liquid diet therefore advancing to soft diet today.  Continue supportive care and antiemetics.    # Left UPJ nephrolithiasis with mild left hydronephrosis: Status post left ureteral stent placed by urology.  Continue current antibiotics and Ditropan. -Repeat x-ray today with a stent in place with a stable appearance.  Follow-up with urologist.  #Hypertension: Blood pressure elevated.  Start Avapro as patient takes valsartan at home which is not available.  Monitor blood pressure.  Type 2 diabetes: Continue sliding scale.  Monitor blood sugar level.  Hyperlipidemia: Continue statin  History of COPD: Patient has no wheezing or shortness of breath.  Continue supportive care and bronchodilators as needed.   DVT prophylaxis: Ambulation, SCD. Code Status: Full code Family Communication: No family at bedside Disposition Plan: Currently admitted    Consultants:   Urologist  Procedures: Stent placement Antimicrobials: Unasyn  Subjective: Seen and examined at bedside.  Patient came from x-ray.  She is walking in  the room.  Denied nausea vomiting.  Abdominal pain is better.  Tolerated liquid diet well.  Objective: Vitals:   11/02/17 0900 11/02/17 1400 11/03/17 0600 11/03/17 1320  BP: (!) 162/69 (!) 156/79 (!) 167/84 (!) 169/70  Pulse: 75 90 80 76  Resp: 16 16 16 16   Temp: 98 F (36.7 C) 98.1 F (36.7 C) 97.8 F (36.6 C) 97.6 F (36.4 C)  TempSrc: Oral Oral Oral Oral  SpO2: 94% 98% 98% 99%  Weight:      Height:        Intake/Output Summary (Last 24 hours) at 11/03/2017 1550 Last data filed at 11/03/2017 1320 Gross per 24 hour  Intake 400 ml  Output 2800 ml  Net -2400 ml   Filed Weights   10/31/17 0349  Weight: 59 kg (130 lb)    Examination:  General exam: Appears calm and comfortable  Respiratory system: Clear to auscultation. Respiratory effort normal. No wheezing or crackle Cardiovascular system: S1 & S2 heard, RRR.  No pedal edema. Gastrointestinal system: Abdomen is nondistended, soft and nontender. Normal bowel sounds heard. Central nervous system: Alert and oriented. No focal neurological deficits. Extremities: Symmetric 5 x 5 power. Skin: No rashes, lesions or ulcers Psychiatry: Judgement and insight appear normal. Mood & affect appropriate.     Data Reviewed: I have personally reviewed following labs and imaging studies  CBC: Recent Labs  Lab 10/31/17 0407 11/01/17 0506 11/03/17 0537  WBC 22.3* 12.8* 6.2  NEUTROABS 18.3*  --   --   HGB 13.2 11.2* 12.3  HCT 39.7 33.8* 37.2  MCV 93.6 94.7 93.0  PLT 300 236 235   Basic Metabolic Panel: Recent Labs  Lab 10/31/17 0407 11/01/17 0506 11/03/17 0537  NA 136 140 141  K 3.8  3.8 3.8  CL 101 109 109  CO2 25 23 26   GLUCOSE 212* 187* 178*  BUN 16 8 11   CREATININE 0.55 0.44 0.50  CALCIUM 9.6 8.1* 8.5*   GFR: Estimated Creatinine Clearance: 56.4 mL/min (by C-G formula based on SCr of 0.5 mg/dL). Liver Function Tests: Recent Labs  Lab 10/31/17 0407  AST 24  ALT 20  ALKPHOS 41  BILITOT 0.8  PROT 7.0    ALBUMIN 4.1   Recent Labs  Lab 10/31/17 0407  LIPASE 180*   No results for input(s): AMMONIA in the last 168 hours. Coagulation Profile: No results for input(s): INR, PROTIME in the last 168 hours. Cardiac Enzymes: No results for input(s): CKTOTAL, CKMB, CKMBINDEX, TROPONINI in the last 168 hours. BNP (last 3 results) No results for input(s): PROBNP in the last 8760 hours. HbA1C: No results for input(s): HGBA1C in the last 72 hours. CBG: Recent Labs  Lab 11/02/17 1205 11/02/17 1704 11/02/17 2246 11/03/17 0732 11/03/17 1158  GLUCAP 132* 158* 144* 154* 160*   Lipid Profile: No results for input(s): CHOL, HDL, LDLCALC, TRIG, CHOLHDL, LDLDIRECT in the last 72 hours. Thyroid Function Tests: No results for input(s): TSH, T4TOTAL, FREET4, T3FREE, THYROIDAB in the last 72 hours. Anemia Panel: No results for input(s): VITAMINB12, FOLATE, FERRITIN, TIBC, IRON, RETICCTPCT in the last 72 hours. Sepsis Labs: No results for input(s): PROCALCITON, LATICACIDVEN in the last 168 hours.  Recent Results (from the past 240 hour(s))  Culture, Urine     Status: Abnormal   Collection Time: 10/31/17  9:33 AM  Result Value Ref Range Status   Specimen Description URINE, CLEAN CATCH  Final   Special Requests NONE  Final   Culture (A)  Final    <10,000 COLONIES/mL INSIGNIFICANT GROWTH Performed at [facility], [address] [place], [place] [address]    Report Status 11/01/2017 FINAL  Final         Radiology Studies: Dg Abd 2 Views  Result Date: 11/03/2017 CLINICAL DATA:  Left-sided flank pain EXAM: ABDOMEN - 2 VIEW COMPARISON:  10/31/2017 FINDINGS: Scattered large and small bowel gas is noted. No free air is seen. Left ureteral stent is noted. Proximal left ureteral stone is again identified and stable. Postsurgical changes are noted within the lower lumbar spine. IMPRESSION: Left ureteral stent with proximal ureteral stone stable in appearance. No other focal abnormality is  noted. Electronically Signed   By: Alcide Clever M.D.   On: 11/03/2017 10:45        Scheduled Meds: . atorvastatin  40 mg Oral QPM  . docusate sodium  100 mg Oral BID  . insulin aspart  0-9 Units Subcutaneous TID WC  . oxybutynin  5 mg Oral TID  . polyethylene glycol  17 g Oral Daily   Continuous Infusions: . ampicillin-sulbactam (UNASYN) IV Stopped (11/03/17 1303)     LOS: 3 days    Trace Wirick Jaynie Collins, MD [facility] Pager [phone]  If 7PM-7AM, please contact night-coverage [email or web address] Password [email or web address] 11/03/2017, 3:50 PM

## 2017-11-03 NOTE — Progress Notes (Signed)
Pharmacy Antibiotic Note  Doris Wyatt is a 71 y.o. female admitted on 10/31/2017 with UTI and intra-abdominal infection.  Pharmacy has been consulted for Unasyn dosing.  Today, 11/03/2017 Day #4 antibiotic - UCx unrevealing - WBC WNL - Renal: SCr WNL  Plan:  Continue unasyn 3gm IV q6h   Pharmacy to sign-off as do not anticipate need to adjust  Consider change to PO augmentin 875mg  PO BID when appropriate   Height: 5\' 5"  (165.1 cm) Weight: 130 lb (59 kg) IBW/kg (Calculated) : 57  Temp (24hrs), Avg:98 F (36.7 C), Min:97.8 F (36.6 C), Max:98.1 F (36.7 C)  Recent Labs  Lab 10/31/17 0407 11/01/17 0506 11/03/17 0537  WBC 22.3* 12.8* 6.2  CREATININE 0.55 0.44 0.50    Estimated Creatinine Clearance: 56.4 mL/min (by C-G formula based on SCr of 0.5 mg/dL).    No Known Allergies  Antimicrobials this admission: 12/2 ciprofloxacin x1 in ED  12/2 metrondiazole x 1 in ED 12/2 Unasyn  Dose adjustments this admission: ---  Microbiology results: 12/2 UCx: <10K insignificant growth  Thank you for allowing pharmacy to be a part of this patient's care.  Doreene Eland, PharmD, BCPS.   Pager: [phone] 11/03/2017 10:02 AM

## 2017-11-04 DIAGNOSIS — N132 Hydronephrosis with renal and ureteral calculous obstruction: Secondary | ICD-10-CM

## 2017-11-04 DIAGNOSIS — E138 Other specified diabetes mellitus with unspecified complications: Secondary | ICD-10-CM

## 2017-11-04 LAB — GLUCOSE, CAPILLARY: Glucose-Capillary: 193 mg/dL — ABNORMAL HIGH (ref 65–99)

## 2017-11-04 MED ORDER — POLYETHYLENE GLYCOL 3350 17 G PO PACK: 17.0000 g | PACK | Freq: Every day | ORAL | 0 refills | Status: DC

## 2017-11-04 MED ORDER — ACETAMINOPHEN 325 MG PO TABS: 650.0000 mg | ORAL_TABLET | Freq: Four times a day (QID) | ORAL | Status: AC | PRN

## 2017-11-04 MED ORDER — FLORANEX PO PACK: 1.0000 g | PACK | Freq: Three times a day (TID) | ORAL | 0 refills | Status: DC

## 2017-11-04 MED ORDER — OXYBUTYNIN CHLORIDE 5 MG PO TABS: 5.0000 mg | ORAL_TABLET | Freq: Three times a day (TID) | ORAL | 0 refills | Status: DC

## 2017-11-04 MED ORDER — AMOXICILLIN-POT CLAVULANATE 875-125 MG PO TABS: 1.0000 | ORAL_TABLET | Freq: Two times a day (BID) | ORAL | 0 refills | Status: AC

## 2017-11-04 NOTE — H&P (View-Only) (Signed)
4 Days Post-Op  Subjective: CC: Left flank pain.  Hx:  Mrs. Vokes is doing better with reduced pain.  She has some urgency and mild hematuria.   Recent KUB shows the stent in good position and the stone visible at the [place].    ROS:  Review of Systems  Constitutional: Negative for chills and fever.  Gastrointestinal:       She has had 3 BM's today.     Anti-infectives: Anti-infectives (From admission, onward)   Start     Dose/Rate Route Frequency Ordered Stop   10/31/17 1000  Ampicillin-Sulbactam (UNASYN) 3 g in sodium chloride 0.9 % 100 mL IVPB     3 g 200 mL/hr over 30 Minutes Intravenous Every 6 hours 10/31/17 0845     10/31/17 0645  ciprofloxacin (CIPRO) IVPB 400 mg     400 mg 200 mL/hr over 60 Minutes Intravenous  Once 10/31/17 0643 10/31/17 0928   10/31/17 0645  metroNIDAZOLE (FLAGYL) IVPB 500 mg     500 mg 100 mL/hr over 60 Minutes Intravenous  Once 10/31/17 2119 10/31/17 4174      Current Facility-Administered Medications  Medication Dose Route Frequency Provider Last Rate Last Dose  . acetaminophen (TYLENOL) tablet 650 mg  650 mg Oral Q6H PRN Phillips Grout, MD   650 mg at 11/02/17 0814   Or  . acetaminophen (TYLENOL) suppository 650 mg  650 mg Rectal Q6H PRN Phillips Grout, MD      . ALPRAZolam Duanne Moron) tablet 0.25 mg  0.25 mg Oral QHS PRN Phillips Grout, MD   0.25 mg at 11/03/17 2129  . Ampicillin-Sulbactam (UNASYN) 3 g in sodium chloride 0.9 % 100 mL IVPB  3 g Intravenous Q6H Royetta Asal, RPH   Stopped at 11/04/17 0655  . atorvastatin (LIPITOR) tablet 40 mg  40 mg Oral QPM Derrill Kay A, MD   40 mg at 11/03/17 1745  . docusate sodium (COLACE) capsule 100 mg  100 mg Oral BID Barton Dubois, MD   100 mg at 11/03/17 2130  . HYDROcodone-acetaminophen (NORCO/VICODIN) 5-325 MG per tablet 1 tablet  1 tablet Oral Q6H PRN Phillips Grout, MD   1 tablet at 11/03/17 2129  . insulin aspart (novoLOG) injection 0-9 Units  0-9 Units Subcutaneous TID WC Phillips Grout,  MD   2 Units at 11/04/17 [phone]  . irbesartan (AVAPRO) tablet 75 mg  75 mg Oral Daily Rosita Fire, MD   75 mg at 11/03/17 1841  . mometasone-formoterol (DULERA) 200-5 MCG/ACT inhaler 2 puff  2 puff Inhalation BID Rosita Fire, MD   2 puff at 11/04/17 [phone]  . ondansetron (ZOFRAN) tablet 4 mg  4 mg Oral Q6H PRN Phillips Grout, MD       Or  . ondansetron [facility]) injection 4 mg  4 mg Intravenous Q6H PRN Phillips Grout, MD      . oxybutynin (DITROPAN) tablet 5 mg  5 mg Oral TID Barton Dubois, MD   5 mg at 11/03/17 2130  . polyethylene glycol (MIRALAX / GLYCOLAX) packet 17 g  17 g Oral Daily Barton Dubois, MD   17 g at 11/03/17 1045     Objective: Vital signs in last 24 hours: Temp:  [97.6 F (36.4 C)-98.8 F (37.1 C)] 98.2 F (36.8 C) (12/06 0530) Pulse Rate:  [65-79] 65 (12/06 0530) Resp:  [16] 16 (12/06 0530) BP: (159-169)/(70-75) 159/72 (12/06 0530) SpO2:  [94 %-99 %] 94 % (12/06 0807)  Intake/Output from  previous day: 12/05 0701 - 12/06 0700 In: 36 [P.O.:840; IV Piggyback:400] Out: 3700 [Urine:3700] Intake/Output this shift: No intake/output data recorded.   Physical Exam  Constitutional: She is oriented to person, place, and time and well-developed, well-nourished, and in no distress.  Cardiovascular: Normal rate and regular rhythm.  Pulmonary/Chest: Effort normal. No respiratory distress.  Abdominal:  Mild left CVAT.   Neurological: She is alert and oriented to person, place, and time.  Vitals reviewed.   Lab Results:  Recent Labs    11/03/17 0537  WBC 6.2  HGB 12.3  HCT 37.2  PLT 235   BMET Recent Labs    11/03/17 0537  NA 141  K 3.8  CL 109  CO2 26  GLUCOSE 178*  BUN 11  CREATININE 0.50  CALCIUM 8.5*   PT/INR No results for input(s): LABPROT, INR in the last 72 hours. ABG No results for input(s): PHART, HCO3 in the last 72 hours.  Invalid input(s): PCO2, PO2  Studies/Results: Dg Abd 2 Views  Result Date:  11/03/2017 CLINICAL DATA:  Left-sided flank pain EXAM: ABDOMEN - 2 VIEW COMPARISON:  10/31/2017 FINDINGS: Scattered large and small bowel gas is noted. No free air is seen. Left ureteral stent is noted. Proximal left ureteral stone is again identified and stable. Postsurgical changes are noted within the lower lumbar spine. IMPRESSION: Left ureteral stent with proximal ureteral stone stable in appearance. No other focal abnormality is noted. Electronically Signed   By: Inez Catalina M.D.   On: 11/03/2017 10:45     Assessment and Plan: Left UPJ stone with obstruction.   She is feeling better with reduced pain.   Stent in good position on KUB and stone visible at UPJ.   Pt on the schedule for 12/11 for ureteroscopy at 63 AM at [facility] main OR.       LOS: 4 days    Irine Seal 11/04/2017 [phone] ID: [name], female   DOB: 14-Dec-1945, 71 y.o.   MRN: [id number]

## 2017-11-04 NOTE — Progress Notes (Signed)
Discharge and medication instructions reviewed with patient. Questions answered and patient denies further questions. Four prescriptions given to patient. Family member is coming to drive patient home. Donne Hazel, RN

## 2017-11-04 NOTE — Discharge Summary (Signed)
Physician Discharge Summary  Doris Wyatt NWG:[id number] DOB: 04-Dec-1945 DOA: 10/31/2017  PCP: Leighton Ruff, MD  Admit date: 10/31/2017 Discharge date: 11/04/2017  Admitted From:home Disposition:home  Recommendations for Outpatient Follow-up:  1. Follow up with PCP in 1-2 weeks 2. Please obtain BMP/CBC in one week 3. Please come to [facility] on 12/11 for ureteroscopy at 730 AM at [facility] main OR.       Or contact Urologist  Home Health:no Equipment/Devices:none Discharge Condition:stable CODE STATUS:full code Diet recommendation:heart healthy  Brief/Interim Summary: 71 year old female with history of COPD, diabetes, hypertension, hyperlipidemia presented with worsening left lower quadrant pain associated with nausea and vomiting.  The patient was treated with Cipro and Flagyl as an outpatient and completed antibiotics prior to admission.  She was initially doing better but 2 days prior to admission the pain returned.  CT scan of abdomen pelvis consistent with a left partially obstructing ureteral stone with left hydronephrosis and also diverticulitis with microperforation.  Patient was admitted for IV antibiotics and further evaluation.  #Left lower quadrant abdominal pain/descending colon diverticulitis with microperforation: Treated with IV Unasyn with significant clinical improvement.  Patient denied nausea vomiting or abdominal pain.  Tolerating diet well.  Plan to discharge with oral Augmentin for 10 more days.  Also ordered probiotics.  Recommended to follow-up with PCP.   # Left UPJ nephrolithiasis with mild left hydronephrosis: Status post left ureteral stent placed by urology.  Continue  antibiotics and Ditropan. -Repeat x-ray  with a stent in place with a stable appearance.  -Urology recommended repeat procedure on December 11 at [facility].  I have discussed with the patient regarding the follow-up and information provided to the patient.  Patient verbalized  understanding.  #Hypertension: Continue home medication.  Monitor blood pressure.  #Type 2 diabetes: Continue oral hypoglycemic agent.  Monitor blood sugar level and follow-up with PCP.  #Hyperlipidemia: Continue statin  #History of COPD: Patient has no wheezing or shortness of breath.  Continue supportive care and bronchodilators as needed.   Patient is clinically improved.  Tolerating diet well.  No pain nausea vomiting.  Discharging with oral Augmentin with outpatient follow-up.   Discharge Diagnoses:  Principal Problem:   Abdominal pain, acute, left lower quadrant Active Problems:   Diverticulitis of large intestine with complication   Hypertension   COPD (chronic obstructive pulmonary disease) (HCC)   Hyperlipemia   Diabetes (HCC)   Ureteral stone with hydronephrosis   Diverticulitis of large intestine with perforation without abscess or bleeding    Discharge Instructions  Discharge Instructions    Call MD for:  difficulty breathing, headache or visual disturbances   Complete by:  As directed    Call MD for:  extreme fatigue   Complete by:  As directed    Call MD for:  hives   Complete by:  As directed    Call MD for:  persistant dizziness or light-headedness   Complete by:  As directed    Call MD for:  persistant nausea and vomiting   Complete by:  As directed    Call MD for:  severe uncontrolled pain   Complete by:  As directed    Call MD for:  temperature >100.4   Complete by:  As directed    Diet - low sodium heart healthy   Complete by:  As directed    Discharge instructions   Complete by:  As directed    Please take prescribed probiotics or yogurt while on antibiotics.   Increase activity  slowly   Complete by:  As directed      Allergies as of 11/04/2017   No Known Allergies     Medication List    TAKE these medications   acetaminophen 325 MG tablet Commonly known as:  TYLENOL Take 2 tablets (650 mg total) by mouth every 6 (six) hours as  needed for mild pain (or Fever >/= 101).   albuterol 108 (90 Base) MCG/ACT inhaler Commonly known as:  PROVENTIL HFA;VENTOLIN HFA Inhale 2 puffs into the lungs every 6 (six) hours as needed. For wheeze or shortness of breath   alendronate 70 MG tablet Commonly known as:  FOSAMAX Take 70 mg by mouth every 7 (seven) days. Take with a full glass of water on an empty stomach. Takes on Tuesday.   ALPRAZolam 0.25 MG tablet Commonly known as:  XANAX Take 0.25 mg by mouth at bedtime as needed for anxiety.   amoxicillin-clavulanate 875-125 MG tablet Commonly known as:  AUGMENTIN Take 1 tablet by mouth every 12 (twelve) hours for 10 days.   aspirin 325 MG tablet Take 325 mg by mouth daily.   atorvastatin 40 MG tablet Commonly known as:  LIPITOR Take 40 mg by mouth every evening.   CALCIUM 500/D PO Take 1 tablet 4 (four) times daily by mouth.   Fluticasone-Salmeterol 100-50 MCG/DOSE Aepb Commonly known as:  ADVAIR Inhale 1 puff 2 (two) times daily into the lungs.   glimepiride 2 MG tablet Commonly known as:  AMARYL Take 2 mg by mouth daily.   GOLD BOND EX Apply 1 application topically daily as needed (for feet and hands).   HYDROcodone-acetaminophen 5-325 MG tablet Commonly known as:  NORCO/VICODIN Take 1 tablet every 6 (six) hours by mouth.   lactobacillus Pack Take 1 packet (1 g total) by mouth 3 (three) times daily with meals.   metFORMIN 1000 MG tablet Commonly known as:  GLUCOPHAGE Take 1,000-1,500 mg by mouth 2 (two) times daily with a meal. Takes 1.5 tabs= 1500mg  in am and 1 tab=1000mg  in pm   multivitamin with minerals Tabs tablet Take 1 tablet by mouth daily.   ondansetron 4 MG disintegrating tablet Commonly known as:  ZOFRAN ODT Take 1 tablet (4 mg total) every 8 (eight) hours as needed by mouth for nausea or vomiting.   oxybutynin 5 MG tablet Commonly known as:  DITROPAN Take 1 tablet (5 mg total) by mouth 3 (three) times daily.   polyethylene glycol  packet Commonly known as:  MIRALAX / GLYCOLAX Take 17 g by mouth daily. Start taking on:  11/05/2017   valsartan 80 MG tablet Commonly known as:  DIOVAN Take 80 mg by mouth daily.      Follow-up Information    Irine Seal, MD Follow up.   Specialty:  Urology Why:  My office will contact you to arrange the next procedure. schedule for 12/11 for ureteroscopy at 51 AM at [facility] main OR.  Contact information: [place] [address] [phone]        Leighton Ruff, MD. Schedule an appointment as soon as possible for a visit in 1 week(s).   Specialty:  Family Medicine Contact information: [place] [address] [phone]          No Known Allergies  Consultations: Urology  Procedures/Studies: Stent placement  Subjective: Seen and examined at bedside.  Patient is sitting in bed comfortable.  Denies headache, dizziness, nausea vomiting chest pain shortness of breath.  Able to tolerate diet  well.  Verbalized outpatient follow-up instructions.  Discharge Exam: Vitals:   11/04/17 0530 11/04/17 0807  BP: (!) 159/72   Pulse: 65   Resp: 16   Temp: 98.2 F (36.8 C)   SpO2: 98% 94%   Vitals:   11/03/17 1320 11/03/17 2122 11/04/17 0530 11/04/17 0807  BP: (!) 169/70 (!) 169/75 (!) 159/72   Pulse: 76 79 65   Resp: 16 16 16    Temp: 97.6 F (36.4 C) 98.8 F (37.1 C) 98.2 F (36.8 C)   TempSrc: Oral Oral Oral   SpO2: 99% 98% 98% 94%  Weight:      Height:        General: Pt is alert, awake, not in acute distress Cardiovascular: RRR, S1/S2 +, no rubs, no gallops Respiratory: CTA bilaterally, no wheezing, no rhonchi Abdominal: Soft, NT, ND, bowel sounds + Extremities: no edema, no cyanosis    The results of significant diagnostics from this hospitalization (including imaging, microbiology, ancillary and laboratory) are listed below for reference.     Microbiology: Recent Results (from the past 240 hour(s))  Culture, Urine      Status: Abnormal   Collection Time: 10/31/17  9:33 AM  Result Value Ref Range Status   Specimen Description URINE, CLEAN CATCH  Final   Special Requests NONE  Final   Culture (A)  Final    <10,000 COLONIES/mL INSIGNIFICANT GROWTH Performed at [facility], [address], [place], [place] [address]    Report Status 11/01/2017 FINAL  Final     Labs: BNP (last 3 results) No results for input(s): BNP in the last 8760 hours. Basic Metabolic Panel: Recent Labs  Lab 10/31/17 0407 11/01/17 0506 11/03/17 0537  NA 136 140 141  K 3.8 3.8 3.8  CL 101 109 109  CO2 25 23 26   GLUCOSE 212* 187* 178*  BUN 16 8 11   CREATININE 0.55 0.44 0.50  CALCIUM 9.6 8.1* 8.5*   Liver Function Tests: Recent Labs  Lab 10/31/17 0407  AST 24  ALT 20  ALKPHOS 41  BILITOT 0.8  PROT 7.0  ALBUMIN 4.1   Recent Labs  Lab 10/31/17 0407  LIPASE 180*   No results for input(s): AMMONIA in the last 168 hours. CBC: Recent Labs  Lab 10/31/17 0407 11/01/17 0506 11/03/17 0537  WBC 22.3* 12.8* 6.2  NEUTROABS 18.3*  --   --   HGB 13.2 11.2* 12.3  HCT 39.7 33.8* 37.2  MCV 93.6 94.7 93.0  PLT 300 236 235   Cardiac Enzymes: No results for input(s): CKTOTAL, CKMB, CKMBINDEX, TROPONINI in the last 168 hours. BNP: Invalid input(s): POCBNP CBG: Recent Labs  Lab 11/03/17 0732 11/03/17 1158 11/03/17 1721 11/03/17 2139 11/04/17 0752  GLUCAP 154* 160* 129* 218* 193*   D-Dimer No results for input(s): DDIMER in the last 72 hours. Hgb A1c No results for input(s): HGBA1C in the last 72 hours. Lipid Profile No results for input(s): CHOL, HDL, LDLCALC, TRIG, CHOLHDL, LDLDIRECT in the last 72 hours. Thyroid function studies No results for input(s): TSH, T4TOTAL, T3FREE, THYROIDAB in the last 72 hours.  Invalid input(s): FREET3 Anemia work up No results for input(s): VITAMINB12, FOLATE, FERRITIN, TIBC, IRON, RETICCTPCT in the last 72 hours. Urinalysis    Component Value Date/Time    COLORURINE YELLOW 10/31/2017 0407   APPEARANCEUR CLEAR 10/31/2017 0407   LABSPEC 1.028 10/31/2017 0407   PHURINE 5.0 10/31/2017 0407   GLUCOSEU 50 (A) 10/31/2017 0407   HGBUR MODERATE (A) 10/31/2017 0407  BILIRUBINUR NEGATIVE 10/31/2017 0407   KETONESUR NEGATIVE 10/31/2017 0407   PROTEINUR NEGATIVE 10/31/2017 0407   UROBILINOGEN 0.2 01/12/2009 0757   NITRITE NEGATIVE 10/31/2017 0407   LEUKOCYTESUR TRACE (A) 10/31/2017 0407   Sepsis Labs Invalid input(s): PROCALCITONIN,  WBC,  LACTICIDVEN Microbiology Recent Results (from the past 240 hour(s))  Culture, Urine     Status: Abnormal   Collection Time: 10/31/17  9:33 AM  Result Value Ref Range Status   Specimen Description URINE, CLEAN CATCH  Final   Special Requests NONE  Final   Culture (A)  Final    <10,000 COLONIES/mL INSIGNIFICANT GROWTH Performed at [facility], [address], [place], [place] [address]    Report Status 11/01/2017 FINAL  Final     Time coordinating discharge: 27 minutes  SIGNED:   Rosita Fire, MD  [facility] 11/04/2017, 10:43 AM  If 7PM-7AM, please contact night-coverage [email or web address] Password [email or web address]

## 2017-11-04 NOTE — Progress Notes (Signed)
4 Days Post-Op  Subjective: CC: Left flank pain.  Hx:  Doris Wyatt is doing better with reduced pain.  She has some urgency and mild hematuria.   Recent KUB shows the stent in good position and the stone visible at the [place].    ROS:  Review of Systems  Constitutional: Negative for chills and fever.  Gastrointestinal:       She has had 3 BM's today.     Anti-infectives: Anti-infectives (From admission, onward)   Start     Dose/Rate Route Frequency Ordered Stop   10/31/17 1000  Ampicillin-Sulbactam (UNASYN) 3 g in sodium chloride 0.9 % 100 mL IVPB     3 g 200 mL/hr over 30 Minutes Intravenous Every 6 hours 10/31/17 0845     10/31/17 0645  ciprofloxacin (CIPRO) IVPB 400 mg     400 mg 200 mL/hr over 60 Minutes Intravenous  Once 10/31/17 0643 10/31/17 0928   10/31/17 0645  metroNIDAZOLE (FLAGYL) IVPB 500 mg     500 mg 100 mL/hr over 60 Minutes Intravenous  Once 10/31/17 0240 10/31/17 9735      Current Facility-Administered Medications  Medication Dose Route Frequency Provider Last Rate Last Dose  . acetaminophen (TYLENOL) tablet 650 mg  650 mg Oral Q6H PRN Phillips Grout, MD   650 mg at 11/02/17 3299   Or  . acetaminophen (TYLENOL) suppository 650 mg  650 mg Rectal Q6H PRN Phillips Grout, MD      . ALPRAZolam Duanne Moron) tablet 0.25 mg  0.25 mg Oral QHS PRN Phillips Grout, MD   0.25 mg at 11/03/17 2129  . Ampicillin-Sulbactam (UNASYN) 3 g in sodium chloride 0.9 % 100 mL IVPB  3 g Intravenous Q6H Royetta Asal, RPH   Stopped at 11/04/17 0655  . atorvastatin (LIPITOR) tablet 40 mg  40 mg Oral QPM Derrill Kay A, MD   40 mg at 11/03/17 1745  . docusate sodium (COLACE) capsule 100 mg  100 mg Oral BID Barton Dubois, MD   100 mg at 11/03/17 2130  . HYDROcodone-acetaminophen (NORCO/VICODIN) 5-325 MG per tablet 1 tablet  1 tablet Oral Q6H PRN Phillips Grout, MD   1 tablet at 11/03/17 2129  . insulin aspart (novoLOG) injection 0-9 Units  0-9 Units Subcutaneous TID WC Phillips Grout,  MD   2 Units at 11/04/17 [phone]  . irbesartan (AVAPRO) tablet 75 mg  75 mg Oral Daily Rosita Fire, MD   75 mg at 11/03/17 1841  . mometasone-formoterol (DULERA) 200-5 MCG/ACT inhaler 2 puff  2 puff Inhalation BID Rosita Fire, MD   2 puff at 11/04/17 [phone]  . ondansetron (ZOFRAN) tablet 4 mg  4 mg Oral Q6H PRN Phillips Grout, MD       Or  . ondansetron [facility]) injection 4 mg  4 mg Intravenous Q6H PRN Phillips Grout, MD      . oxybutynin (DITROPAN) tablet 5 mg  5 mg Oral TID Barton Dubois, MD   5 mg at 11/03/17 2130  . polyethylene glycol (MIRALAX / GLYCOLAX) packet 17 g  17 g Oral Daily Barton Dubois, MD   17 g at 11/03/17 1045     Objective: Vital signs in last 24 hours: Temp:  [97.6 F (36.4 C)-98.8 F (37.1 C)] 98.2 F (36.8 C) (12/06 0530) Pulse Rate:  [65-79] 65 (12/06 0530) Resp:  [16] 16 (12/06 0530) BP: (159-169)/(70-75) 159/72 (12/06 0530) SpO2:  [94 %-99 %] 94 % (12/06 0807)  Intake/Output from  previous day: 12/05 0701 - 12/06 0700 In: 5 [P.O.:840; IV Piggyback:400] Out: 3700 [Urine:3700] Intake/Output this shift: No intake/output data recorded.   Physical Exam  Constitutional: She is oriented to person, place, and time and well-developed, well-nourished, and in no distress.  Cardiovascular: Normal rate and regular rhythm.  Pulmonary/Chest: Effort normal. No respiratory distress.  Abdominal:  Mild left CVAT.   Neurological: She is alert and oriented to person, place, and time.  Vitals reviewed.   Lab Results:  Recent Labs    11/03/17 0537  WBC 6.2  HGB 12.3  HCT 37.2  PLT 235   BMET Recent Labs    11/03/17 0537  NA 141  K 3.8  CL 109  CO2 26  GLUCOSE 178*  BUN 11  CREATININE 0.50  CALCIUM 8.5*   PT/INR No results for input(s): LABPROT, INR in the last 72 hours. ABG No results for input(s): PHART, HCO3 in the last 72 hours.  Invalid input(s): PCO2, PO2  Studies/Results: Dg Abd 2 Views  Result Date:  11/03/2017 CLINICAL DATA:  Left-sided flank pain EXAM: ABDOMEN - 2 VIEW COMPARISON:  10/31/2017 FINDINGS: Scattered large and small bowel gas is noted. No free air is seen. Left ureteral stent is noted. Proximal left ureteral stone is again identified and stable. Postsurgical changes are noted within the lower lumbar spine. IMPRESSION: Left ureteral stent with proximal ureteral stone stable in appearance. No other focal abnormality is noted. Electronically Signed   By: Inez Catalina M.D.   On: 11/03/2017 10:45     Assessment and Plan: Left UPJ stone with obstruction.   She is feeling better with reduced pain.   Stent in good position on KUB and stone visible at UPJ.   Pt on the schedule for 12/11 for ureteroscopy at 3 AM at [facility] main OR.       LOS: 4 days    Irine Seal 11/04/2017 [phone] ID: Doris Wyatt, female   DOB: July 12, 1946, 71 y.o.   MRN: [id number]

## 2017-11-08 ENCOUNTER — Encounter (HOSPITAL_COMMUNITY): Payer: Self-pay | Admitting: *Deleted

## 2017-11-08 ENCOUNTER — Other Ambulatory Visit: Payer: Self-pay

## 2017-11-08 NOTE — Progress Notes (Signed)
    Karem Farha Stormer  11/08/2017      Your procedure is scheduled on 11/09/2017  Report to [facility] A.M.  Call this number if you have problems the morning of surgery:[phone]             OUR ADDRESS IS [place] , WE ARE LOCATED IN [place].    Remember:  Do not eat food or drink liquids after midnight.  Take these medicines the morning of surgery with A SIP OF WATER use inhalers  As usual and bring  Do not wear jewelry, make-up or nail polish.  Do not wear lotions, powders, or perfumes, or deoderant.  Do not shave 48 hours prior to surgery.  Men may shave face and neck.  Do not bring valuables to the hospital.  [facility] is not responsible for any belongings or valuables.  Contacts, dentures or bridgework may not be worn into surgery.  Leave your suitcase in the car.  After surgery it may be brought to your room.  For patients admitted to the hospital, discharge time will be determined by your treatment team.   Special instructions:   Please read over the following fact sheets that you were given.

## 2017-11-08 NOTE — Anesthesia Pain Management Evaluation Note (Signed)
Reviewed for patient to be here to [facility] at 0555am.  And address and instructions of the location given to patient.  Patient stated a police office is bringing her.  I explained to paitent that the Surgery Center is located in the [facility] building next to [facility]   Patient stated that one of medications is making her have a dry mouth and one of medications is making her stomach upset.  Instructed patient to call the office of Dr Irine Seal and/or the office of her PCP, Doris Wyatt.  Phone number for Dr Drema Dallas given . Patient voiced understanding.

## 2017-11-09 ENCOUNTER — Encounter (HOSPITAL_BASED_OUTPATIENT_CLINIC_OR_DEPARTMENT_OTHER)
Admission: RE | Disposition: A | Discharge status: Home / Self Care | Payer: Self-pay | Source: Ambulatory Visit | Attending: Urology

## 2017-11-09 ENCOUNTER — Ambulatory Visit (HOSPITAL_BASED_OUTPATIENT_CLINIC_OR_DEPARTMENT_OTHER): Payer: Medicare HMO | Admitting: Anesthesiology

## 2017-11-09 ENCOUNTER — Ambulatory Visit (HOSPITAL_COMMUNITY)
Admission: RE | Admit: 2017-11-09 | Discharge: 2017-11-09 | Disposition: A | Discharge status: Home / Self Care | Payer: Medicare HMO | Source: Ambulatory Visit | Attending: Urology | Admitting: Urology

## 2017-11-09 ENCOUNTER — Encounter (HOSPITAL_BASED_OUTPATIENT_CLINIC_OR_DEPARTMENT_OTHER): Payer: Self-pay

## 2017-11-09 DIAGNOSIS — J449 Chronic obstructive pulmonary disease, unspecified: Secondary | ICD-10-CM | POA: Insufficient documentation

## 2017-11-09 DIAGNOSIS — N202 Calculus of kidney with calculus of ureter: Secondary | ICD-10-CM | POA: Diagnosis not present

## 2017-11-09 DIAGNOSIS — Z87891 Personal history of nicotine dependence: Secondary | ICD-10-CM | POA: Diagnosis not present

## 2017-11-09 DIAGNOSIS — E118 Type 2 diabetes mellitus with unspecified complications: Secondary | ICD-10-CM | POA: Insufficient documentation

## 2017-11-09 DIAGNOSIS — N132 Hydronephrosis with renal and ureteral calculous obstruction: Secondary | ICD-10-CM | POA: Diagnosis not present

## 2017-11-09 DIAGNOSIS — Z794 Long term (current) use of insulin: Secondary | ICD-10-CM | POA: Insufficient documentation

## 2017-11-09 DIAGNOSIS — I10 Essential (primary) hypertension: Secondary | ICD-10-CM | POA: Insufficient documentation

## 2017-11-09 DIAGNOSIS — E785 Hyperlipidemia, unspecified: Secondary | ICD-10-CM | POA: Diagnosis not present

## 2017-11-09 DIAGNOSIS — Z79899 Other long term (current) drug therapy: Secondary | ICD-10-CM | POA: Insufficient documentation

## 2017-11-09 DIAGNOSIS — E119 Type 2 diabetes mellitus without complications: Secondary | ICD-10-CM | POA: Diagnosis not present

## 2017-11-09 HISTORY — PX: CYSTOSCOPY/URETEROSCOPY/HOLMIUM LASER/STENT PLACEMENT: SHX6546

## 2017-11-09 HISTORY — DX: Major depressive disorder, single episode, unspecified: F32.9

## 2017-11-09 HISTORY — DX: Depression, unspecified: F32.A

## 2017-11-09 HISTORY — DX: Anxiety disorder, unspecified: F41.9

## 2017-11-09 HISTORY — DX: Unspecified osteoarthritis, unspecified site: M19.90

## 2017-11-09 HISTORY — DX: Pneumonia, unspecified organism: J18.9

## 2017-11-09 HISTORY — DX: Personal history of urinary calculi: Z87.442

## 2017-11-09 LAB — GLUCOSE, CAPILLARY: GLUCOSE-CAPILLARY: 213 mg/dL — AB (ref 65–99)

## 2017-11-09 SURGERY — CYSTOSCOPY/URETEROSCOPY/HOLMIUM LASER/STENT PLACEMENT
Anesthesia: General | Laterality: Left

## 2017-11-09 MED ORDER — PROPOFOL 10 MG/ML IV BOLUS
INTRAVENOUS | Status: AC
  Filled 2017-11-09: qty 40

## 2017-11-09 MED ORDER — MORPHINE SULFATE (PF) 2 MG/ML IV SOLN
2.0000 mg | INTRAVENOUS | Status: DC | PRN
  Filled 2017-11-09: qty 1

## 2017-11-09 MED ORDER — OXYBUTYNIN CHLORIDE 5 MG PO TABS
ORAL_TABLET | ORAL | Status: AC
  Filled 2017-11-09: qty 1

## 2017-11-09 MED ORDER — ONDANSETRON HCL 4 MG/2ML IJ SOLN
INTRAMUSCULAR | Status: AC
  Filled 2017-11-09: qty 2

## 2017-11-09 MED ORDER — CEFAZOLIN SODIUM-DEXTROSE 2-4 GM/100ML-% IV SOLN
2.0000 g | INTRAVENOUS | Status: AC
  Administered 2017-11-09: 2 g via INTRAVENOUS
  Filled 2017-11-09: qty 100

## 2017-11-09 MED ORDER — ACETAMINOPHEN 650 MG RE SUPP
650.0000 mg | RECTAL | Status: DC | PRN
  Filled 2017-11-09: qty 1

## 2017-11-09 MED ORDER — FENTANYL CITRATE (PF) 100 MCG/2ML IJ SOLN
INTRAMUSCULAR | Status: DC | PRN
  Administered 2017-11-09 (×2): 50 ug via INTRAVENOUS

## 2017-11-09 MED ORDER — LACTATED RINGERS IV SOLN
INTRAVENOUS | Status: DC
  Administered 2017-11-09 (×3): via INTRAVENOUS
  Filled 2017-11-09: qty 1000

## 2017-11-09 MED ORDER — DEXAMETHASONE SODIUM PHOSPHATE 4 MG/ML IJ SOLN
INTRAMUSCULAR | Status: DC | PRN
  Administered 2017-11-09: 10 mg via INTRAVENOUS

## 2017-11-09 MED ORDER — SODIUM CHLORIDE 0.9% FLUSH
3.0000 mL | Freq: Two times a day (BID) | INTRAVENOUS | Status: DC
  Filled 2017-11-09: qty 3

## 2017-11-09 MED ORDER — OXYBUTYNIN CHLORIDE 5 MG PO TABS
5.0000 mg | ORAL_TABLET | Freq: Once | ORAL | Status: AC
  Administered 2017-11-09: 5 mg via ORAL
  Filled 2017-11-09: qty 1

## 2017-11-09 MED ORDER — FENTANYL CITRATE (PF) 100 MCG/2ML IJ SOLN
INTRAMUSCULAR | Status: AC
  Filled 2017-11-09: qty 2

## 2017-11-09 MED ORDER — LIDOCAINE 2% (20 MG/ML) 5 ML SYRINGE
INTRAMUSCULAR | Status: AC
Start: 2017-11-09 — End: 2017-11-09
  Filled 2017-11-09: qty 5

## 2017-11-09 MED ORDER — MIDAZOLAM HCL 2 MG/2ML IJ SOLN
INTRAMUSCULAR | Status: AC
  Filled 2017-11-09: qty 2

## 2017-11-09 MED ORDER — FENTANYL CITRATE (PF) 100 MCG/2ML IJ SOLN
25.0000 ug | INTRAMUSCULAR | Status: DC | PRN
Start: 2017-11-09 — End: 2017-11-09
  Administered 2017-11-09 (×2): 50 ug via INTRAVENOUS
  Filled 2017-11-09: qty 1

## 2017-11-09 MED ORDER — MIDAZOLAM HCL 5 MG/5ML IJ SOLN
INTRAMUSCULAR | Status: DC | PRN
  Administered 2017-11-09 (×2): 1 mg via INTRAVENOUS

## 2017-11-09 MED ORDER — LIDOCAINE 2% (20 MG/ML) 5 ML SYRINGE
INTRAMUSCULAR | Status: DC | PRN
  Administered 2017-11-09: 80 mg via INTRAVENOUS

## 2017-11-09 MED ORDER — SODIUM CHLORIDE 0.9 % IV SOLN
250.0000 mL | INTRAVENOUS | Status: DC | PRN
  Filled 2017-11-09: qty 250

## 2017-11-09 MED ORDER — ONDANSETRON HCL 4 MG/2ML IJ SOLN
INTRAMUSCULAR | Status: DC | PRN
  Administered 2017-11-09: 4 mg via INTRAVENOUS

## 2017-11-09 MED ORDER — OXYCODONE HCL 5 MG PO TABS
5.0000 mg | ORAL_TABLET | ORAL | Status: DC | PRN
  Filled 2017-11-09: qty 2

## 2017-11-09 MED ORDER — SODIUM CHLORIDE 0.9 % IR SOLN
Status: DC | PRN
  Administered 2017-11-09: 1000 mL via INTRAVESICAL
  Administered 2017-11-09: 3000 mL via INTRAVESICAL

## 2017-11-09 MED ORDER — MEPERIDINE HCL 25 MG/ML IJ SOLN
6.2500 mg | INTRAMUSCULAR | Status: DC | PRN
  Administered 2017-11-09: 6.25 mg via INTRAVENOUS
  Filled 2017-11-09: qty 1

## 2017-11-09 MED ORDER — PROPOFOL 10 MG/ML IV BOLUS
INTRAVENOUS | Status: DC | PRN
Start: 2017-11-09 — End: 2017-11-09
  Administered 2017-11-09: 150 mg via INTRAVENOUS

## 2017-11-09 MED ORDER — CEFAZOLIN SODIUM-DEXTROSE 2-4 GM/100ML-% IV SOLN
INTRAVENOUS | Status: AC
  Filled 2017-11-09: qty 100

## 2017-11-09 MED ORDER — DEXAMETHASONE SODIUM PHOSPHATE 10 MG/ML IJ SOLN
INTRAMUSCULAR | Status: AC
  Filled 2017-11-09: qty 1

## 2017-11-09 MED ORDER — SODIUM CHLORIDE 0.9% FLUSH
3.0000 mL | INTRAVENOUS | Status: DC | PRN
  Filled 2017-11-09: qty 3

## 2017-11-09 MED ORDER — ACETAMINOPHEN 325 MG PO TABS
650.0000 mg | ORAL_TABLET | ORAL | Status: DC | PRN
  Filled 2017-11-09: qty 2

## 2017-11-09 MED ORDER — OXYCODONE HCL 5 MG/5ML PO SOLN
5.0000 mg | Freq: Once | ORAL | Status: DC | PRN
  Filled 2017-11-09: qty 5

## 2017-11-09 MED ORDER — MEPERIDINE HCL 25 MG/ML IJ SOLN
INTRAMUSCULAR | Status: AC
  Filled 2017-11-09: qty 1

## 2017-11-09 MED ORDER — OXYCODONE HCL 5 MG PO TABS
5.0000 mg | ORAL_TABLET | Freq: Once | ORAL | Status: DC | PRN
  Filled 2017-11-09: qty 1

## 2017-11-09 MED ORDER — ARTIFICIAL TEARS OPHTHALMIC OINT
TOPICAL_OINTMENT | OPHTHALMIC | Status: AC
  Filled 2017-11-09: qty 3.5

## 2017-11-09 SURGICAL SUPPLY — 28 items
BAG DRAIN URO-CYSTO SKYTR STRL (DRAIN) ×3 IMPLANT
BAG DRN UROCATH (DRAIN) ×1
BASKET STONE 1.7 NGAGE (UROLOGICAL SUPPLIES) ×2 IMPLANT
BASKET ZERO TIP NITINOL 2.4FR (BASKET) IMPLANT
BSKT STON RTRVL ZERO TP 2.4FR (BASKET)
CATH URET 5FR 28IN CONE TIP (BALLOONS)
CATH URET 5FR 28IN OPEN ENDED (CATHETERS) IMPLANT
CATH URET 5FR 70CM CONE TIP (BALLOONS) IMPLANT
CLOTH BEACON ORANGE TIMEOUT ST (SAFETY) ×3 IMPLANT
ELECT REM PT RETURN 9FT ADLT (ELECTROSURGICAL)
ELECTRODE REM PT RTRN 9FT ADLT (ELECTROSURGICAL) IMPLANT
FIBER LASER FLEXIVA 365 (UROLOGICAL SUPPLIES) IMPLANT
FIBER LASER TRAC TIP (UROLOGICAL SUPPLIES) ×2 IMPLANT
GLOVE SURG SS PI 8.0 STRL IVOR (GLOVE) ×3 IMPLANT
GOWN STRL REUS W/TWL XL LVL3 (GOWN DISPOSABLE) ×3 IMPLANT
GUIDEWIRE 0.038 PTFE COATED (WIRE) IMPLANT
GUIDEWIRE ANG ZIPWIRE 038X150 (WIRE) IMPLANT
GUIDEWIRE STR DUAL SENSOR (WIRE) ×3 IMPLANT
INFUSOR MANOMETER BAG 3000ML (MISCELLANEOUS) ×3 IMPLANT
IV NS IRRIG 3000ML ARTHROMATIC (IV SOLUTION) ×3 IMPLANT
KIT RM TURNOVER CYSTO AR (KITS) ×3 IMPLANT
MANIFOLD NEPTUNE II (INSTRUMENTS) ×3 IMPLANT
NS IRRIG 500ML POUR BTL (IV SOLUTION) ×3 IMPLANT
PACK CYSTO (CUSTOM PROCEDURE TRAY) ×3 IMPLANT
SHEATH URETERAL 12FRX35CM (MISCELLANEOUS) ×2 IMPLANT
STENT URET 6FRX24 CONTOUR (STENTS) ×2 IMPLANT
TUBE CONNECTING 12'X1/4 (SUCTIONS)
TUBE CONNECTING 12X1/4 (SUCTIONS) IMPLANT

## 2017-11-09 NOTE — Op Note (Signed)
Procedure: 1.  Cystoscopy with removal of left double-J stent. 2.  Left ureteroscopy with holmium lasertripsy, stone manipulation and insertion of double-J stent.  Preop diagnosis: Left proximal ureteral and left renal stones.  Postop diagnosis: Same.  Surgeon: Dr. Irine Seal.  Anesthesia: General.  Specimen: Stone fragments.  Drain: 6 [place] by 24 cm contour double-J stent with tether.  EBL: Minimal.  Complications: None.  Indications: The patient is a 71 year old white female who originally presented with a 13 mm left proximal ureteral stone with obstruction and infection that was initially managed with placement of a stent and antibiotic therapy.  She returns now for ureteroscopy and stone removal.  Procedure: She was taken to the operating room where she was given Ancef.  A general anesthetic was induced.  She was placed in the lithotomy position and fitted with PAS hose.  Her perineum and genitalia were prepped with Betadine solution and she was draped in the usual sterile fashion.  Cystoscopy was then performed with a 23 [place] scope and 30 degree lens.  The stent was visualized at the left ureteral orifice and grasped with a grasping forceps.  The stent was pulled to the urethral meatus.  A guidewire was passed through the stent to the kidney.  The stent was then removed.  A 12/14 introducer sheath and a core was then passed to the level of the stone without difficulty.  The assembled 38 cm she was  then advanced to just below the proximal ureteral stone and the inner core and wire were removed.  A dual-lumen digital flexible ureteroscope was then passed through the sheath to the stone.  The stone was then engaged with a 200 m tract tipped laser fiber initially set on 0.5 W and 20 Hz.  The power was increased to 0.8 W to improve fragmentation.  The stone refluxed back to the kidney and additional fragmentation was performed with the stone in the kidney.  Once the stone was  completely fragmented, further inspection of the intrarenal collecting system revealed the renal stone beneath a mucosal film in the upper pole.  The mucosa was incised with the laser and multiple small stones were removed from the area.  An N-gage basket was employed for stone removal.  Once the upper pole stones had been removed the fragments of the ureteral stone which were in the lower pole were removed.  Once final inspection revealed that all but the most minute fragments had been removed from the kidney, the ureteroscope was removed.  A guidewire was then inserted back to the kidney through the sheath and the sheath was removed.  A 6 French 24 cm contour double-J stent with tether was then passed to the kidney under fluoroscopic guidance.  The wire was removed leaving a good coil in the kidney and in the bladder.  The stent string was left exiting the urethra.  The cystoscope was then reinserted and the bladder was drained.  The stent string was tied close to the meatus trimmed and tucked vaginally and a tampon string fashion.  The patient was taken down from the lithotomy position, her anesthetic was reversed and she was moved to recovery room in stable condition.  There were no complications.

## 2017-11-09 NOTE — Interval H&P Note (Signed)
History and Physical Interval Note:  She is doing well and will have stone removal today.   11/09/2017 7:50 AM  Doris Wyatt  has presented today for surgery, with the diagnosis of LEFT URETERAL PELVIC JUNCTION AND RENAL STONE  The various methods of treatment have been discussed with the patient and family. After consideration of risks, benefits and other options for treatment, the patient has consented to  Procedure(s): CYSTOSCOPY LEFT URETEROSCOPY WITH HOLMIUM LASER STENT EXCHANGE (Left) as a surgical intervention .  The patient's history has been reviewed, patient examined, no change in status, stable for surgery.  I have reviewed the patient's chart and labs.  Questions were answered to the patient's satisfaction.     Irine Seal

## 2017-11-09 NOTE — Anesthesia Preprocedure Evaluation (Signed)
Anesthesia Evaluation  Patient identified by MRN, date of birth, ID band Patient awake    Reviewed: Allergy & Precautions, NPO status , Patient's Chart, lab work & pertinent test results  History of Anesthesia Complications Negative for: history of anesthetic complications  Airway Mallampati: II  TM Distance: >3 FB Neck ROM: Full    Dental  (+) Poor Dentition,    Pulmonary shortness of breath, neg sleep apnea, COPD,  COPD inhaler, neg recent URI, former smoker, neg PE   breath sounds clear to auscultation       Cardiovascular hypertension, Pt. on medications (-) angina(-) Past MI and (-) CHF  Rhythm:Regular     Neuro/Psych negative neurological ROS  negative psych ROS   GI/Hepatic negative GI ROS, Neg liver ROS,   Endo/Other  diabetes, Type 2, Oral Hypoglycemic Agents  Renal/GU Renal disease     Musculoskeletal   Abdominal   Peds  Hematology   Anesthesia Other Findings   Reproductive/Obstetrics                             Anesthesia Physical Anesthesia Plan  ASA: III  Anesthesia Plan: General   Post-op Pain Management:    Induction: Intravenous  PONV Risk Score and Plan: 3 and Ondansetron, Dexamethasone and Treatment may vary due to age or medical condition  Airway Management Planned: LMA  Additional Equipment: None  Intra-op Plan:   Post-operative Plan: Extubation in OR  Informed Consent: I have reviewed the patients History and Physical, chart, labs and discussed the procedure including the risks, benefits and alternatives for the proposed anesthesia with the patient or authorized representative who has indicated his/her understanding and acceptance.   Dental advisory given  Plan Discussed with: CRNA and Surgeon  Anesthesia Plan Comments:         Anesthesia Quick Evaluation

## 2017-11-09 NOTE — Transfer of Care (Signed)
Immediate Anesthesia Transfer of Care Note  Patient: Theodora Lalanne Oatis  Procedure(s) Performed: Procedure(s) (LRB): CYSTOSCOPY LEFT URETEROSCOPY WITH HOLMIUM LASER STENT EXCHANGE, STONE BASKET RETRIVAL, STENT PLACEMENT (Left)  Patient Location: PACU  Anesthesia Type: General  Level of Consciousness: awake, oriented, sedated and patient cooperative  Airway & Oxygen Therapy: Patient Spontanous Breathing and Patient connected to face mask oxygen  Post-op Assessment: Report given to PACU RN and Post -op Vital signs reviewed and stable  Post vital signs: Reviewed and stable  Complications: No apparent anesthesia complications Last Vitals:  Vitals:   11/09/17 0553 11/09/17 0855  BP: 134/64 102/66  Pulse: (!) 102 92  Resp: 18 (!) 24  Temp: 36.9 C (!) 36.4 C  SpO2: 96% 100%    Last Pain:  Vitals:   11/09/17 0553  TempSrc: Oral      Patients Stated Pain Goal: 3 (11/09/17 5400)

## 2017-11-09 NOTE — Anesthesia Procedure Notes (Signed)
Procedure Name: LMA Insertion Date/Time: 11/09/2017 7:58 AM Performed by: Mechele Claude, CRNA Pre-anesthesia Checklist: Patient identified, Emergency Drugs available, Suction available and Patient being monitored Patient Re-evaluated:Patient Re-evaluated prior to induction Oxygen Delivery Method: Circle system utilized Preoxygenation: Pre-oxygenation with 100% oxygen Induction Type: IV induction Ventilation: Mask ventilation without difficulty LMA: LMA inserted LMA Size: 4.0 Number of attempts: 1 Airway Equipment and Method: Bite block Placement Confirmation: positive ETCO2 Tube secured with: Tape Dental Injury: Teeth and Oropharynx as per pre-operative assessment

## 2017-11-09 NOTE — Discharge Instructions (Addendum)
Post Anesthesia Home Care Instructions  Activity: Get plenty of rest for the remainder of the day. A responsible individual must stay with you for 24 hours following the procedure.  For the next 24 hours, DO NOT: -Drive a car -Paediatric nurse -Drink alcoholic beverages -Take any medication unless instructed by your physician -Make any legal decisions or sign important papers.  Meals: Start with liquid foods such as gelatin or soup. Progress to regular foods as tolerated. Avoid greasy, spicy, heavy foods. If nausea and/or vomiting occur, drink only clear liquids until the nausea and/or vomiting subsides. Call your physician if vomiting continues.  Special Instructions/Symptoms: Your throat may feel dry or sore from the anesthesia or the breathing tube placed in your throat during surgery. If this causes discomfort, gargle with warm salt water. The discomfort should disappear within 24 hours.  If you had a scopolamine patch placed behind your ear for the management of post- operative nausea and/or vomiting:  1. The medication in the patch is effective for 72 hours, after which it should be removed.  Wrap patch in a tissue and discard in the trash. Wash hands thoroughly with soap and water. 2. You may remove the patch earlier than 72 hours if you experience unpleasant side effects which may include dry mouth, dizziness or visual disturbances. 3. Avoid touching the patch. Wash your hands with soap and water after contact with the patch.   Ureteral Stent Implantation, Care After Refer to this sheet in the next few weeks. These instructions provide you with information about caring for yourself after your procedure. Your health care provider may also give you more specific instructions. Your treatment has been planned according to current medical practices, but problems sometimes occur. Call your health care provider if you have any problems or questions after your procedure. What can I expect  after the procedure? After the procedure, it is common to have:  Nausea.  Mild pain when you urinate. You may feel this pain in your lower back or lower abdomen. Pain should stop within a few minutes after you urinate. This may last for up to 1 week.  A small amount of blood in your urine for several days.  Follow these instructions at home:  Medicines  Take over-the-counter and prescription medicines only as told by your health care provider.  If you were prescribed an antibiotic medicine, take it as told by your health care provider. Do not stop taking the antibiotic even if you start to feel better.  Do not drive for 24 hours if you received a sedative.  Do not drive or operate heavy machinery while taking prescription pain medicines. Activity  Return to your normal activities as told by your health care provider. Ask your health care provider what activities are safe for you.  Do not lift anything that is heavier than 10 lb (4.5 kg). Follow this limit for 1 week after your procedure, or for as long as told by your health care provider. General instructions  Watch for any blood in your urine. Call your health care provider if the amount of blood in your urine increases.  If you have a catheter: ? Follow instructions from your health care provider about taking care of your catheter and collection bag. ? Do not take baths, swim, or use a hot tub until your health care provider approves.  Drink enough fluid to keep your urine clear or pale yellow.  Keep all follow-up visits as told by your health care provider.  This is important. Contact a health care provider if:  You have pain that gets worse or does not get better with medicine, especially pain when you urinate.  You have difficulty urinating.  You feel nauseous or you vomit repeatedly during a period of more than 2 days after the procedure. Get help right away if:  Your urine is dark red or has blood clots in  it.  You are leaking urine (have incontinence).  The end of the stent comes out of your urethra.  You cannot urinate.  You have sudden, sharp, or severe pain in your abdomen or lower back.  You have a fever.  You may remove the stent by pulling the string that is tucked in the vaginal area.  If you don't feel comfortable doing that you may come to the office Friday morning to have that done.    Please bring your stone fragments to the office so they can be sent for analysis.   This information is not intended to replace advice given to you by your health care provider. Make sure you discuss any questions you have with your health care provider. Document Released: 07/19/2013 Document Revised: 04/23/2016 Document Reviewed: 05/31/2015 Elsevier Interactive Patient Education  [employer].

## 2017-11-10 ENCOUNTER — Emergency Department (HOSPITAL_COMMUNITY)
Admission: EM | Admit: 2017-11-10 | Discharge: 2017-11-10 | Disposition: A | Discharge status: Home / Self Care | Payer: Medicare HMO | Attending: Physician Assistant | Admitting: Physician Assistant

## 2017-11-10 ENCOUNTER — Encounter (HOSPITAL_COMMUNITY): Payer: Self-pay | Admitting: *Deleted

## 2017-11-10 ENCOUNTER — Emergency Department (HOSPITAL_COMMUNITY): Payer: Medicare HMO

## 2017-11-10 DIAGNOSIS — J449 Chronic obstructive pulmonary disease, unspecified: Secondary | ICD-10-CM | POA: Insufficient documentation

## 2017-11-10 DIAGNOSIS — I10 Essential (primary) hypertension: Secondary | ICD-10-CM | POA: Insufficient documentation

## 2017-11-10 DIAGNOSIS — T819XXA Unspecified complication of procedure, initial encounter: Secondary | ICD-10-CM | POA: Insufficient documentation

## 2017-11-10 DIAGNOSIS — N200 Calculus of kidney: Secondary | ICD-10-CM | POA: Diagnosis not present

## 2017-11-10 DIAGNOSIS — E119 Type 2 diabetes mellitus without complications: Secondary | ICD-10-CM | POA: Insufficient documentation

## 2017-11-10 DIAGNOSIS — Y69 Unspecified misadventure during surgical and medical care: Secondary | ICD-10-CM | POA: Diagnosis not present

## 2017-11-10 DIAGNOSIS — Z7984 Long term (current) use of oral hypoglycemic drugs: Secondary | ICD-10-CM | POA: Diagnosis not present

## 2017-11-10 DIAGNOSIS — N201 Calculus of ureter: Secondary | ICD-10-CM | POA: Diagnosis not present

## 2017-11-10 DIAGNOSIS — Z7982 Long term (current) use of aspirin: Secondary | ICD-10-CM | POA: Insufficient documentation

## 2017-11-10 DIAGNOSIS — R110 Nausea: Secondary | ICD-10-CM | POA: Insufficient documentation

## 2017-11-10 DIAGNOSIS — Z87891 Personal history of nicotine dependence: Secondary | ICD-10-CM | POA: Insufficient documentation

## 2017-11-10 DIAGNOSIS — R109 Unspecified abdominal pain: Secondary | ICD-10-CM | POA: Diagnosis present

## 2017-11-10 DIAGNOSIS — N202 Calculus of kidney with calculus of ureter: Secondary | ICD-10-CM | POA: Diagnosis not present

## 2017-11-10 DIAGNOSIS — Z466 Encounter for fitting and adjustment of urinary device: Secondary | ICD-10-CM | POA: Diagnosis not present

## 2017-11-10 DIAGNOSIS — Z79899 Other long term (current) drug therapy: Secondary | ICD-10-CM | POA: Insufficient documentation

## 2017-11-10 LAB — COMPREHENSIVE METABOLIC PANEL
ALT: 16 U/L (ref 14–54)
AST: 17 U/L (ref 15–41)
Albumin: 3.8 g/dL (ref 3.5–5.0)
Alkaline Phosphatase: 53 U/L (ref 38–126)
Anion gap: 10 (ref 5–15)
BUN: 12 mg/dL (ref 6–20)
CHLORIDE: 103 mmol/L (ref 101–111)
CO2: 23 mmol/L (ref 22–32)
Calcium: 8.7 mg/dL — ABNORMAL LOW (ref 8.9–10.3)
Creatinine, Ser: 0.65 mg/dL (ref 0.44–1.00)
Glucose, Bld: 251 mg/dL — ABNORMAL HIGH (ref 65–99)
POTASSIUM: 3.8 mmol/L (ref 3.5–5.1)
Sodium: 136 mmol/L (ref 135–145)
Total Bilirubin: 0.7 mg/dL (ref 0.3–1.2)
Total Protein: 6.9 g/dL (ref 6.5–8.1)

## 2017-11-10 LAB — URINALYSIS, ROUTINE W REFLEX MICROSCOPIC
BILIRUBIN URINE: NEGATIVE
Glucose, UA: 50 mg/dL — AB
KETONES UR: 5 mg/dL — AB
Nitrite: NEGATIVE
PH: 6 (ref 5.0–8.0)
Protein, ur: 30 mg/dL — AB
Specific Gravity, Urine: 1.014 (ref 1.005–1.030)

## 2017-11-10 LAB — CBC
HEMATOCRIT: 35.6 % — AB (ref 36.0–46.0)
HEMOGLOBIN: 12 g/dL (ref 12.0–15.0)
MCH: 31.3 pg (ref 26.0–34.0)
MCHC: 33.7 g/dL (ref 30.0–36.0)
MCV: 93 fL (ref 78.0–100.0)
Platelets: 261 10*3/uL (ref 150–400)
RBC: 3.83 MIL/uL — AB (ref 3.87–5.11)
RDW: 13.6 % (ref 11.5–15.5)
WBC: 17.1 10*3/uL — AB (ref 4.0–10.5)

## 2017-11-10 LAB — LIPASE, BLOOD: LIPASE: 32 U/L (ref 11–51)

## 2017-11-10 MED ORDER — FENTANYL CITRATE (PF) 100 MCG/2ML IJ SOLN
50.0000 ug | INTRAMUSCULAR | Status: AC | PRN
  Administered 2017-11-10 (×2): 50 ug via NASAL
  Filled 2017-11-10 (×2): qty 2

## 2017-11-10 MED ORDER — MORPHINE SULFATE (PF) 4 MG/ML IV SOLN
INTRAVENOUS | Status: AC
  Filled 2017-11-10: qty 1

## 2017-11-10 MED ORDER — ONDANSETRON 4 MG PO TBDP: 4.0000 mg | ORAL_TABLET | Freq: Three times a day (TID) | ORAL | 0 refills | Status: DC | PRN

## 2017-11-10 MED ORDER — OXYCODONE-ACETAMINOPHEN 5-325 MG PO TABS: 1.0000 | ORAL_TABLET | Freq: Four times a day (QID) | ORAL | 0 refills | Status: DC | PRN

## 2017-11-10 MED ORDER — KETOROLAC TROMETHAMINE 30 MG/ML IJ SOLN
30.0000 mg | Freq: Once | INTRAMUSCULAR | Status: AC
  Administered 2017-11-10: 30 mg via INTRAVENOUS
  Filled 2017-11-10: qty 1

## 2017-11-10 MED ORDER — OXYCODONE-ACETAMINOPHEN 5-325 MG PO TABS
1.0000 | ORAL_TABLET | Freq: Once | ORAL | Status: AC
  Administered 2017-11-10: 1 via ORAL
  Filled 2017-11-10: qty 1

## 2017-11-10 MED ORDER — ONDANSETRON HCL 4 MG/2ML IJ SOLN
INTRAMUSCULAR | Status: AC
  Filled 2017-11-10: qty 2

## 2017-11-10 MED ORDER — MORPHINE SULFATE (PF) 4 MG/ML IV SOLN
4.0000 mg | Freq: Once | INTRAVENOUS | Status: AC
  Administered 2017-11-10: 4 mg via INTRAVENOUS

## 2017-11-10 MED ORDER — ONDANSETRON HCL 4 MG/2ML IJ SOLN
4.0000 mg | Freq: Once | INTRAMUSCULAR | Status: AC
  Administered 2017-11-10: 4 mg via INTRAVENOUS

## 2017-11-10 NOTE — Discharge Instructions (Signed)
You were seen today and found to have residual parts of your stones in your ureter.  We anticipate that you will pass it on its own.  We have given you pain medicine nausea medicine.  Please call Dr. Jeffie Pollock as an outpatient if you have any concerns.

## 2017-11-10 NOTE — ED Triage Notes (Signed)
Pt complains of left flank pain since pulling out the string attached to her stent 45 minutes prior to arrival to ED.

## 2017-11-10 NOTE — ED Notes (Signed)
Due to pain and behavior, unable to obtain blood at this time. Will attempt after pain medications have started to take effect.

## 2017-11-10 NOTE — ED Notes (Signed)
Pt continues to scream in pain.  Sts she had a lithotripsy and stent placed yesterday.  Severe pain since removing stent this morning.  Family unable to contact [facility].

## 2017-11-10 NOTE — ED Provider Notes (Addendum)
[facility] DEPT Provider Note   CSN: [id number] Arrival date & time: 11/10/17  1154     History   Chief Complaint Chief Complaint  Patient presents with  . Flank Pain  . Post-op Problem    HPI Doris Wyatt is a 71 y.o. female.  HPI   Patient 71 year old female presenting with flank pain.  Patient has left flank pain radiating to her left groin.  Patient had a double-J stent placed 1 week ago for left-sided ureteral stone.  Then yesterday she had a Cystoscopy with removal of left double-J stent as well as Left ureteroscopy with holmium lasertripsy, stone manipulation and insertion of double-J stent.  This was done by Dr. Roni Bread.  Patient reports that she saw the coil and she was told to pull when she saw the coils that she pulled it out this morning.  Patient came in this morning because of the intense pain in her left flank.  Patient has urinated since removal and had normal urine.  Patient has stooled since removal normally.  No vomiting.  Nausea associated with the pain.      Past Medical History:  Diagnosis Date  . Anxiety   . Arthritis   . COPD (chronic obstructive pulmonary disease) ([place])   . Depression   . Diabetes mellitus    type II   . History of kidney stones   . Hypercholesterolemia   . Hyperlipemia   . Hypertension   . Osteoporosis   . Pneumonia    hx of   . Urolithiasis     Patient Active Problem List   Diagnosis Date Noted  . Diverticulitis of large intestine with perforation without abscess or bleeding   . Diverticulitis of large intestine with complication 54/00/8676  . Ureteral stone with hydronephrosis 10/31/2017  . Abdominal pain, acute, left lower quadrant 10/31/2017  . Hypertension   . COPD [place]chronic obstructive pulmonary disease) ([place])   . Hyperlipemia   . Diabetes [facility])     Past Surgical History:  Procedure Laterality Date  . APPENDECTOMY    . BACK SURGERY     x 2  . CYSTOSCOPY W/ URETERAL  STENT PLACEMENT Left 10/31/2017   Procedure: CYSTOSCOPY WITH RETROGRADE PYELOGRAM/LEFT URETERAL STENT PLACEMENT;  Surgeon: Irine Seal, MD;  Location: [facility] ORS;  Service: Urology;  Laterality: Left;  . TUBAL LIGATION      OB History    No data available       Home Medications    Prior to Admission medications   Medication Sig Start Date End Date Taking? Authorizing Provider  acetaminophen (TYLENOL) 325 MG tablet Take 2 tablets (650 mg total) by mouth every 6 (six) hours as needed for mild pain (or Fever >/= 101). 11/04/17   Rosita Fire, MD  albuterol (PROVENTIL HFA;VENTOLIN HFA) 108 (90 BASE) MCG/ACT inhaler Inhale 2 puffs into the lungs every 6 (six) hours as needed. For wheeze or shortness of breath    [provider]  alendronate (FOSAMAX) 70 MG tablet Take 70 mg by mouth every 7 (seven) days. Take with a full glass of water on an empty stomach. Takes on Tuesday.    [provider]  ALPRAZolam Duanne Moron) 0.25 MG tablet Take 0.25 mg by mouth at bedtime as needed for anxiety.    [provider]  amoxicillin-clavulanate (AUGMENTIN) 875-125 MG tablet Take 1 tablet by mouth every 12 (twelve) hours for 10 days. 11/04/17 11/14/17  Rosita Fire, MD  aspirin 325 MG tablet  Take 325 mg by mouth daily.    [provider]  atorvastatin (LIPITOR) 40 MG tablet Take 40 mg by mouth every evening.     [provider]  Calcium Carbonate-Vitamin D (CALCIUM 500/D PO) Take 1 tablet 4 (four) times daily by mouth.     [provider]  Fluticasone-Salmeterol (ADVAIR) 100-50 MCG/DOSE AEPB Inhale 1 puff 2 (two) times daily into the lungs.    [provider]  glimepiride (AMARYL) 2 MG tablet Take 2 mg by mouth daily. 10/16/16   [provider]  HYDROcodone-acetaminophen (NORCO/VICODIN) 5-325 MG tablet Take 1 tablet every 6 (six) hours by mouth. 08/25/17   [provider]  lactobacillus (FLORANEX/LACTINEX) PACK Take 1 packet  (1 g total) by mouth 3 (three) times daily with meals. 11/04/17   Rosita Fire, MD  Menthol-Zinc Oxide (GOLD BOND EX) Apply 1 application topically daily as needed (for feet and hands).    [provider]  metFORMIN (GLUCOPHAGE) 1000 MG tablet Take 1,000-1,500 mg by mouth 2 (two) times daily with a meal. Takes 1.5 tabs= 1500mg  in am and 1 tab=1000mg  in pm    [provider]  Multiple Vitamin (MULITIVITAMIN WITH MINERALS) TABS Take 1 tablet by mouth daily.    [provider]  ondansetron (ZOFRAN ODT) 4 MG disintegrating tablet Take 1 tablet (4 mg total) every 8 (eight) hours as needed by mouth for nausea or vomiting. 10/13/17   Varney Biles, MD  oxybutynin (DITROPAN) 5 MG tablet Take 1 tablet (5 mg total) by mouth 3 (three) times daily. 11/04/17   Rosita Fire, MD  polyethylene glycol [facility] Floria Raveling) packet Take 17 g by mouth daily. 11/05/17   Rosita Fire, MD  valsartan (DIOVAN) 80 MG tablet Take 80 mg by mouth daily.    [provider]    Family History No family history on file.  Social History Social History   Tobacco Use  . Smoking status: Former Smoker    Packs/day: 3.00    Years: 40.00    Pack years: 120.00    Types: Cigarettes    Last attempt to quit: 10/31/2002    Years since quitting: 15.0  . Smokeless tobacco: Never Used  Substance Use Topics  . Alcohol use: Yes    Comment: occaisionally  . Drug use: No     Allergies   Patient has no known allergies.   Review of Systems Review of Systems  Constitutional: Negative for activity change, fatigue and fever.  Respiratory: Negative for shortness of breath.   Cardiovascular: Negative for chest pain.  Gastrointestinal: Positive for abdominal pain.  Genitourinary: Positive for flank pain.     Physical Exam Updated Vital Signs BP (!) 127/93 (BP Location: Left Arm)   Pulse 88   Temp 98.1 F (36.7 C) (Oral)   Resp 18   SpO2 93%   Physical Exam    Constitutional: She is oriented to person, place, and time. She appears well-developed and well-nourished.  HENT:  Head: Normocephalic and atraumatic.  Eyes: Right eye exhibits no discharge.  Cardiovascular: Normal rate, regular rhythm and normal heart sounds.  No murmur heard. Pulmonary/Chest: Effort normal and breath sounds normal. She has no wheezes. She has no rales.  Abdominal: Soft. She exhibits no distension. There is no tenderness.  No real tenderness palpation of flank or abdomen.  Neurological: She is oriented to person, place, and time.  Skin: Skin is warm and dry. She is not diaphoretic.  Psychiatric: She has  a normal mood and affect.  Nursing note and vitals reviewed.    ED Treatments / Results  Labs (all labs ordered are listed, but only abnormal results are displayed) Labs Reviewed  LIPASE, BLOOD  COMPREHENSIVE METABOLIC PANEL  CBC  URINALYSIS, ROUTINE W REFLEX MICROSCOPIC    EKG  EKG Interpretation None       Radiology No results found.  Procedures Procedures (including critical care time)  Medications Ordered in ED Medications  fentaNYL (SUBLIMAZE) injection 50 mcg (50 mcg Nasal Given 11/10/17 1245)  morphine 4 MG/ML injection 4 mg (4 mg Intravenous Given 11/10/17 1316)  ondansetron (ZOFRAN) injection 4 mg (4 mg Intravenous Given 11/10/17 1316)     Initial Impression / Assessment and Plan / ED Course  I have reviewed the triage vital signs and the nursing notes.  Pertinent labs & imaging results that were available during my care of the patient were reviewed by me and considered in my medical decision making (see chart for details).     Patient 71 year old female presenting with flank pain.  Patient has left flank pain radiating to her left groin.  Patient had a double-J stent placed 1 week ago for left-sided ureteral stone.  Then yesterday she had a Cystoscopy with removal of left double-J stent as well as Left ureteroscopy with holmium  lasertripsy, stone manipulation and insertion of double-J stent.  This was done by Dr. Roni Bread.  Patient reports that she saw the coil and she was told to pull when she saw the coils that she pulled it out this morning.  Patient came in this morning because of the intense pain in her left flank.  Patient has urinated since removal and had normal urine.  Patient has stooled since removal normally.  No vomiting.  Nausea associated with the pain.  1:50 PM Will get CT renal to look for stone  4:16 PM Shows stones status post lithotripsy.  A discussed with urology, the anticipate that she will pass this.  They recommended pain management home.  Patient is comfortable now.  We will give her Percocet and Zofran to go home with.  Plan to follow-up outpatient with urologist.  Final Clinical Impressions(s) / ED Diagnoses   Final diagnoses:  None    ED Discharge Orders    None       Macarthur Critchley, MD 11/10/17 1616    Macarthur Critchley, MD 11/10/17 1631

## 2017-11-10 NOTE — ED Notes (Signed)
Bed: WA04 Expected date:  Expected time:  Means of arrival:  Comments: Triage 3  

## 2017-11-10 NOTE — Anesthesia Postprocedure Evaluation (Signed)
Anesthesia Post Note  Patient: Doris Wyatt  Procedure(s) Performed: CYSTOSCOPY LEFT URETEROSCOPY WITH HOLMIUM LASER STENT EXCHANGE, STONE BASKET RETRIVAL, STENT PLACEMENT (Left )     Patient location during evaluation: PACU Anesthesia Type: General Level of consciousness: awake and alert Pain management: pain level controlled Vital Signs Assessment: post-procedure vital signs reviewed and stable Respiratory status: spontaneous breathing, nonlabored ventilation, respiratory function stable and patient connected to nasal cannula oxygen Cardiovascular status: blood pressure returned to baseline and stable Postop Assessment: no apparent nausea or vomiting Anesthetic complications: no    Last Vitals:  Vitals:   11/09/17 1015 11/09/17 1137  BP: (!) 159/83 129/79  Pulse: 84 (!) 106  Resp: 18 16  Temp:  37.7 C  SpO2: 92% 97%    Last Pain:  Vitals:   11/09/17 1145  TempSrc:   PainSc: 5                  Wenzel Backlund

## 2017-11-16 DIAGNOSIS — Z79899 Other long term (current) drug therapy: Secondary | ICD-10-CM | POA: Diagnosis not present

## 2017-11-16 DIAGNOSIS — N200 Calculus of kidney: Secondary | ICD-10-CM | POA: Diagnosis not present

## 2017-11-16 DIAGNOSIS — T887XXA Unspecified adverse effect of drug or medicament, initial encounter: Secondary | ICD-10-CM | POA: Diagnosis not present

## 2017-12-24 DIAGNOSIS — S9031XA Contusion of right foot, initial encounter: Secondary | ICD-10-CM | POA: Diagnosis not present

## 2017-12-24 DIAGNOSIS — S93601A Unspecified sprain of right foot, initial encounter: Secondary | ICD-10-CM | POA: Diagnosis not present

## 2017-12-24 DIAGNOSIS — W108XXA Fall (on) (from) other stairs and steps, initial encounter: Secondary | ICD-10-CM | POA: Diagnosis not present

## 2017-12-27 ENCOUNTER — Other Ambulatory Visit: Payer: Self-pay | Admitting: Family Medicine

## 2017-12-27 DIAGNOSIS — Z1231 Encounter for screening mammogram for malignant neoplasm of breast: Secondary | ICD-10-CM

## 2017-12-31 DIAGNOSIS — S9031XD Contusion of right foot, subsequent encounter: Secondary | ICD-10-CM | POA: Diagnosis not present

## 2018-02-04 ENCOUNTER — Ambulatory Visit
Admission: RE | Admit: 2018-02-04 | Discharge: 2018-02-04 | Disposition: A | Discharge status: Home / Self Care | Payer: Medicare HMO | Source: Ambulatory Visit | Attending: Family Medicine | Admitting: Family Medicine

## 2018-02-04 DIAGNOSIS — Z1231 Encounter for screening mammogram for malignant neoplasm of breast: Secondary | ICD-10-CM

## 2018-04-21 DIAGNOSIS — F419 Anxiety disorder, unspecified: Secondary | ICD-10-CM | POA: Diagnosis not present

## 2018-04-21 DIAGNOSIS — E119 Type 2 diabetes mellitus without complications: Secondary | ICD-10-CM | POA: Diagnosis not present

## 2018-04-21 DIAGNOSIS — Z7984 Long term (current) use of oral hypoglycemic drugs: Secondary | ICD-10-CM | POA: Diagnosis not present

## 2018-04-21 DIAGNOSIS — E7800 Pure hypercholesterolemia, unspecified: Secondary | ICD-10-CM | POA: Diagnosis not present

## 2018-04-21 DIAGNOSIS — I10 Essential (primary) hypertension: Secondary | ICD-10-CM | POA: Diagnosis not present

## 2018-04-21 DIAGNOSIS — M810 Age-related osteoporosis without current pathological fracture: Secondary | ICD-10-CM | POA: Diagnosis not present

## 2018-04-21 DIAGNOSIS — J449 Chronic obstructive pulmonary disease, unspecified: Secondary | ICD-10-CM | POA: Diagnosis not present

## 2018-05-23 DIAGNOSIS — R5383 Other fatigue: Secondary | ICD-10-CM | POA: Diagnosis not present

## 2018-05-23 DIAGNOSIS — S80862A Insect bite (nonvenomous), left lower leg, initial encounter: Secondary | ICD-10-CM | POA: Diagnosis not present

## 2018-05-23 DIAGNOSIS — F419 Anxiety disorder, unspecified: Secondary | ICD-10-CM | POA: Diagnosis not present

## 2018-11-01 ENCOUNTER — Other Ambulatory Visit (HOSPITAL_COMMUNITY)
Admission: RE | Admit: 2018-11-01 | Discharge: 2018-11-01 | Disposition: A | Discharge status: Home / Self Care | Payer: Medicare HMO | Source: Ambulatory Visit | Attending: Family Medicine | Admitting: Family Medicine

## 2018-11-01 ENCOUNTER — Other Ambulatory Visit: Payer: Self-pay | Admitting: Family Medicine

## 2018-11-01 DIAGNOSIS — Z124 Encounter for screening for malignant neoplasm of cervix: Secondary | ICD-10-CM | POA: Diagnosis not present

## 2018-11-01 DIAGNOSIS — Z1159 Encounter for screening for other viral diseases: Secondary | ICD-10-CM | POA: Diagnosis not present

## 2018-11-01 DIAGNOSIS — E7800 Pure hypercholesterolemia, unspecified: Secondary | ICD-10-CM | POA: Diagnosis not present

## 2018-11-01 DIAGNOSIS — I10 Essential (primary) hypertension: Secondary | ICD-10-CM | POA: Diagnosis not present

## 2018-11-01 DIAGNOSIS — Z23 Encounter for immunization: Secondary | ICD-10-CM | POA: Diagnosis not present

## 2018-11-01 DIAGNOSIS — E1169 Type 2 diabetes mellitus with other specified complication: Secondary | ICD-10-CM | POA: Diagnosis not present

## 2018-11-01 DIAGNOSIS — M810 Age-related osteoporosis without current pathological fracture: Secondary | ICD-10-CM | POA: Diagnosis not present

## 2018-11-01 DIAGNOSIS — J45909 Unspecified asthma, uncomplicated: Secondary | ICD-10-CM | POA: Diagnosis not present

## 2018-11-01 DIAGNOSIS — R531 Weakness: Secondary | ICD-10-CM | POA: Diagnosis not present

## 2018-11-01 DIAGNOSIS — Z0000 Encounter for general adult medical examination without abnormal findings: Secondary | ICD-10-CM | POA: Diagnosis not present

## 2018-11-01 DIAGNOSIS — J449 Chronic obstructive pulmonary disease, unspecified: Secondary | ICD-10-CM | POA: Diagnosis not present

## 2018-11-03 LAB — CYTOLOGY - PAP: Diagnosis: NEGATIVE

## 2018-11-07 ENCOUNTER — Other Ambulatory Visit: Payer: Self-pay | Admitting: Family Medicine

## 2018-11-07 DIAGNOSIS — M810 Age-related osteoporosis without current pathological fracture: Secondary | ICD-10-CM

## 2018-11-07 DIAGNOSIS — Z1231 Encounter for screening mammogram for malignant neoplasm of breast: Secondary | ICD-10-CM

## 2018-11-08 DIAGNOSIS — Z7984 Long term (current) use of oral hypoglycemic drugs: Secondary | ICD-10-CM | POA: Diagnosis not present

## 2018-11-08 DIAGNOSIS — E119 Type 2 diabetes mellitus without complications: Secondary | ICD-10-CM | POA: Diagnosis not present

## 2018-11-08 DIAGNOSIS — E781 Pure hyperglyceridemia: Secondary | ICD-10-CM | POA: Diagnosis not present

## 2018-11-28 IMAGING — MG DIGITAL SCREENING BILATERAL MAMMOGRAM WITH CAD
5 series · 5 of 5 positions shown · non-contrast
Comparison: Previous exam(s).

CLINICAL DATA: Screening.

EXAM:
DIGITAL SCREENING BILATERAL MAMMOGRAM WITH CAD

[L CC]
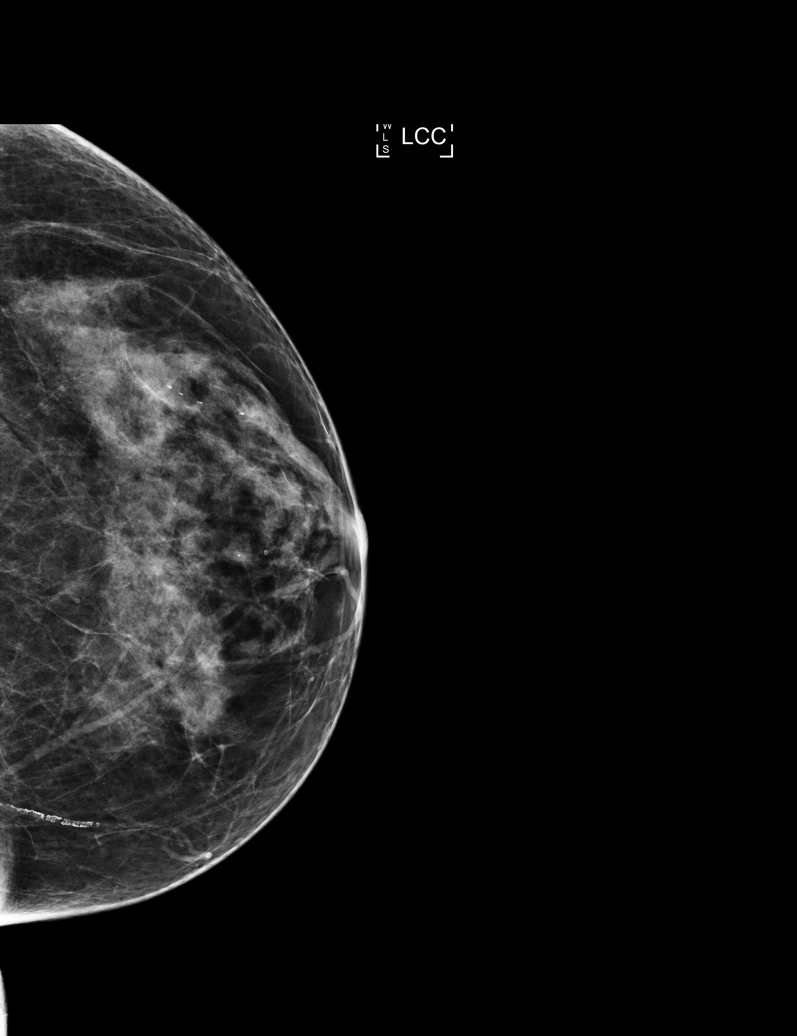

[L MLO]
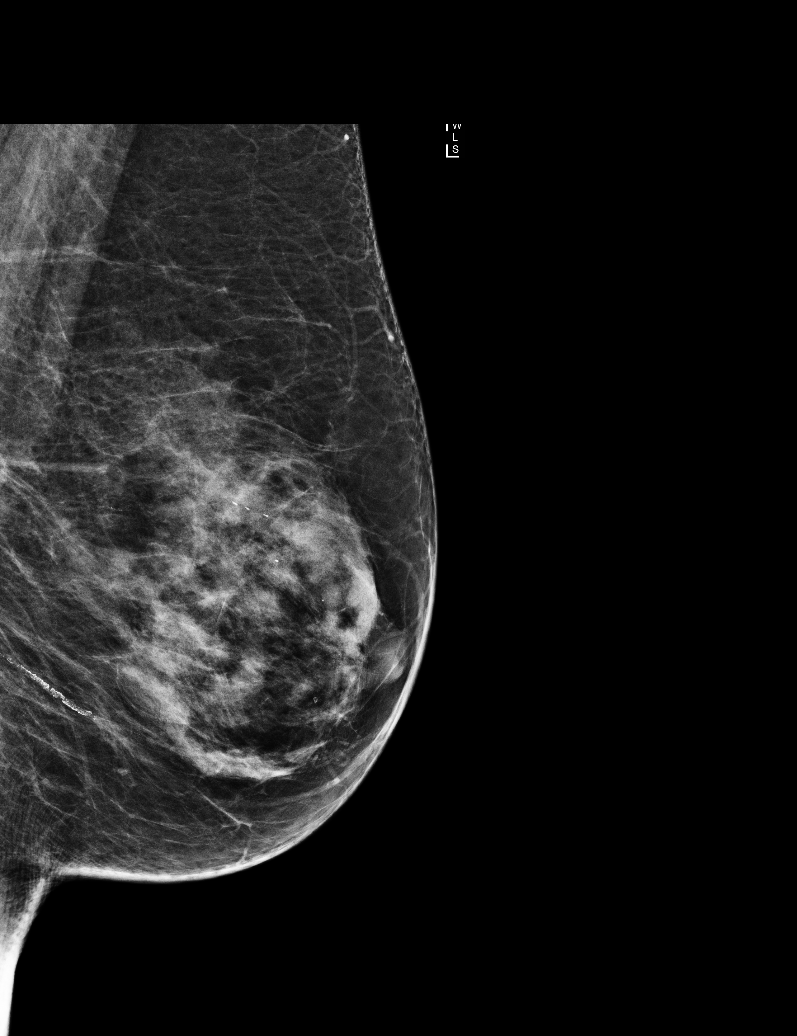

[R CC]
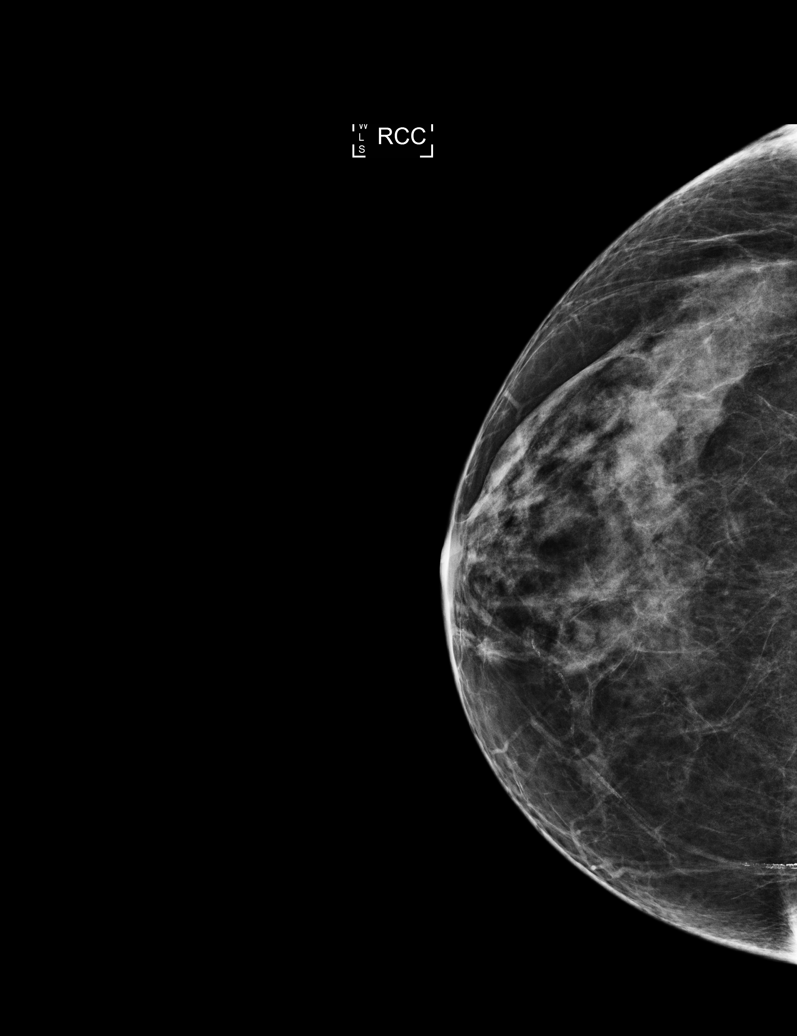

[R MLO (1 of 2)]
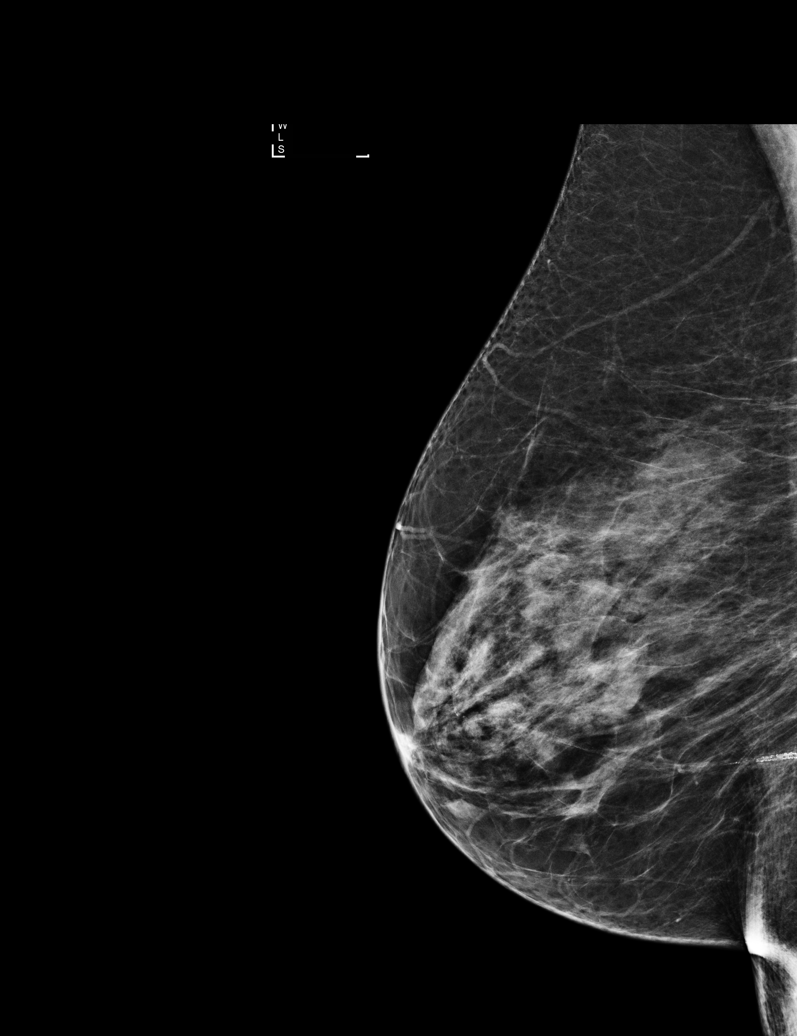

[R MLO (2 of 2)]
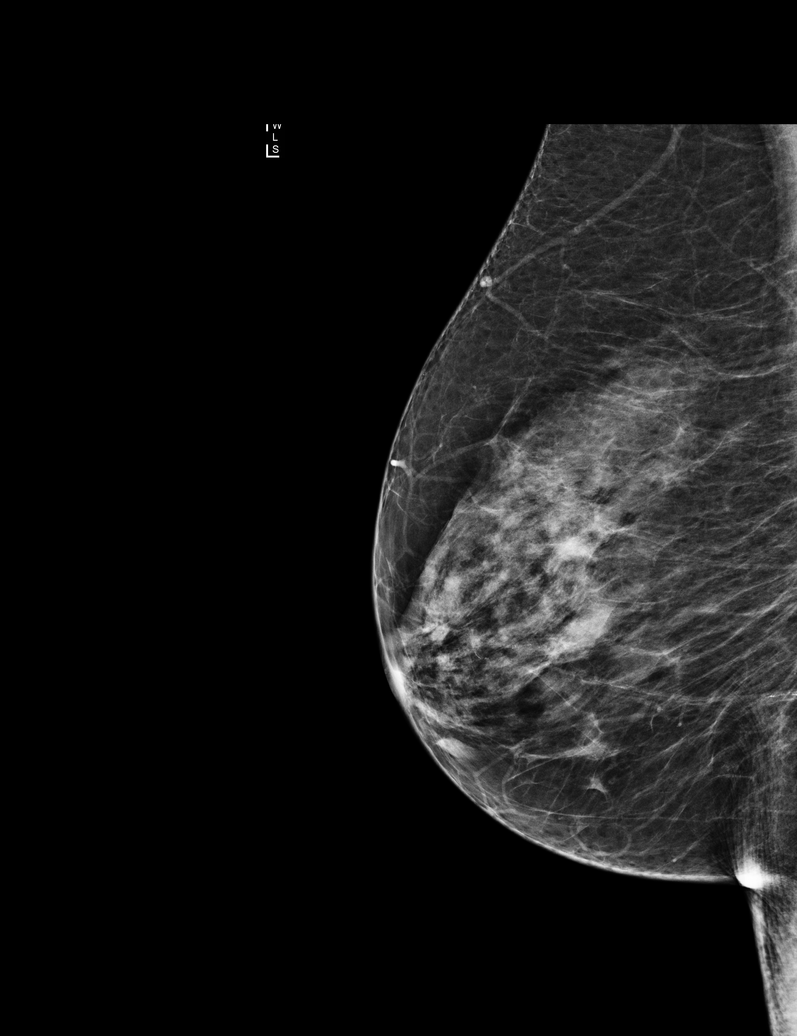

[5 of 5 positions shown; findings below may reference images not displayed]

ACR Breast Density Category c: The breast tissue is heterogeneously
dense, which may obscure small masses.
FINDINGS: There are no findings suspicious for malignancy. Images were
processed with CAD.
IMPRESSION: No mammographic evidence of malignancy. A result letter of this
screening mammogram will be mailed directly to the patient.

RECOMMENDATION:
Screening mammogram in one year. (Code:[id number])

BI-RADS CATEGORY  1: Negative.

## 2019-01-09 DIAGNOSIS — H35033 Hypertensive retinopathy, bilateral: Secondary | ICD-10-CM | POA: Diagnosis not present

## 2019-01-09 DIAGNOSIS — H524 Presbyopia: Secondary | ICD-10-CM | POA: Diagnosis not present

## 2019-03-13 ENCOUNTER — Other Ambulatory Visit: Payer: Medicare HMO

## 2019-03-13 ENCOUNTER — Ambulatory Visit: Payer: Medicare HMO

## 2019-03-26 DIAGNOSIS — A084 Viral intestinal infection, unspecified: Secondary | ICD-10-CM | POA: Diagnosis not present

## 2019-03-26 DIAGNOSIS — K047 Periapical abscess without sinus: Secondary | ICD-10-CM | POA: Diagnosis not present

## 2019-03-26 DIAGNOSIS — R1084 Generalized abdominal pain: Secondary | ICD-10-CM | POA: Diagnosis not present

## 2019-03-30 DIAGNOSIS — K30 Functional dyspepsia: Secondary | ICD-10-CM | POA: Diagnosis not present

## 2019-03-30 DIAGNOSIS — K089 Disorder of teeth and supporting structures, unspecified: Secondary | ICD-10-CM | POA: Diagnosis not present

## 2019-04-04 DIAGNOSIS — I10 Essential (primary) hypertension: Secondary | ICD-10-CM | POA: Diagnosis not present

## 2019-04-04 DIAGNOSIS — J449 Chronic obstructive pulmonary disease, unspecified: Secondary | ICD-10-CM | POA: Diagnosis not present

## 2019-04-04 DIAGNOSIS — E119 Type 2 diabetes mellitus without complications: Secondary | ICD-10-CM | POA: Diagnosis not present

## 2019-04-04 DIAGNOSIS — F418 Other specified anxiety disorders: Secondary | ICD-10-CM | POA: Diagnosis not present

## 2019-05-15 ENCOUNTER — Inpatient Hospital Stay: Admission: RE | Admit: 2019-05-15 | Payer: Medicare HMO | Source: Ambulatory Visit

## 2019-05-15 ENCOUNTER — Other Ambulatory Visit: Payer: Medicare HMO

## 2019-05-29 DIAGNOSIS — K047 Periapical abscess without sinus: Secondary | ICD-10-CM | POA: Diagnosis not present

## 2019-08-22 DIAGNOSIS — I10 Essential (primary) hypertension: Secondary | ICD-10-CM | POA: Diagnosis not present

## 2019-08-22 DIAGNOSIS — E119 Type 2 diabetes mellitus without complications: Secondary | ICD-10-CM | POA: Diagnosis not present

## 2019-08-22 DIAGNOSIS — J45909 Unspecified asthma, uncomplicated: Secondary | ICD-10-CM | POA: Diagnosis not present

## 2019-08-22 DIAGNOSIS — E781 Pure hyperglyceridemia: Secondary | ICD-10-CM | POA: Diagnosis not present

## 2019-08-22 DIAGNOSIS — M810 Age-related osteoporosis without current pathological fracture: Secondary | ICD-10-CM | POA: Diagnosis not present

## 2019-08-22 DIAGNOSIS — J449 Chronic obstructive pulmonary disease, unspecified: Secondary | ICD-10-CM | POA: Diagnosis not present

## 2019-08-22 DIAGNOSIS — E1169 Type 2 diabetes mellitus with other specified complication: Secondary | ICD-10-CM | POA: Diagnosis not present

## 2019-08-24 DIAGNOSIS — E781 Pure hyperglyceridemia: Secondary | ICD-10-CM | POA: Diagnosis not present

## 2019-08-24 DIAGNOSIS — E1169 Type 2 diabetes mellitus with other specified complication: Secondary | ICD-10-CM | POA: Diagnosis not present

## 2019-08-31 DIAGNOSIS — E1169 Type 2 diabetes mellitus with other specified complication: Secondary | ICD-10-CM | POA: Diagnosis not present

## 2019-08-31 DIAGNOSIS — Z7984 Long term (current) use of oral hypoglycemic drugs: Secondary | ICD-10-CM | POA: Diagnosis not present

## 2019-08-31 DIAGNOSIS — E7800 Pure hypercholesterolemia, unspecified: Secondary | ICD-10-CM | POA: Diagnosis not present

## 2019-08-31 DIAGNOSIS — I10 Essential (primary) hypertension: Secondary | ICD-10-CM | POA: Diagnosis not present

## 2019-08-31 DIAGNOSIS — J449 Chronic obstructive pulmonary disease, unspecified: Secondary | ICD-10-CM | POA: Diagnosis not present

## 2019-08-31 DIAGNOSIS — F419 Anxiety disorder, unspecified: Secondary | ICD-10-CM | POA: Diagnosis not present

## 2019-09-29 DIAGNOSIS — J45909 Unspecified asthma, uncomplicated: Secondary | ICD-10-CM | POA: Diagnosis not present

## 2019-09-29 DIAGNOSIS — I10 Essential (primary) hypertension: Secondary | ICD-10-CM | POA: Diagnosis not present

## 2019-09-29 DIAGNOSIS — E119 Type 2 diabetes mellitus without complications: Secondary | ICD-10-CM | POA: Diagnosis not present

## 2019-09-29 DIAGNOSIS — J449 Chronic obstructive pulmonary disease, unspecified: Secondary | ICD-10-CM | POA: Diagnosis not present

## 2019-09-29 DIAGNOSIS — E7800 Pure hypercholesterolemia, unspecified: Secondary | ICD-10-CM | POA: Diagnosis not present

## 2019-09-29 DIAGNOSIS — E1169 Type 2 diabetes mellitus with other specified complication: Secondary | ICD-10-CM | POA: Diagnosis not present

## 2019-09-29 DIAGNOSIS — E781 Pure hyperglyceridemia: Secondary | ICD-10-CM | POA: Diagnosis not present

## 2019-09-29 DIAGNOSIS — M810 Age-related osteoporosis without current pathological fracture: Secondary | ICD-10-CM | POA: Diagnosis not present

## 2019-10-12 ENCOUNTER — Ambulatory Visit: Payer: Medicare HMO | Admitting: *Deleted

## 2019-10-13 DIAGNOSIS — R202 Paresthesia of skin: Secondary | ICD-10-CM | POA: Diagnosis not present

## 2019-10-20 DIAGNOSIS — J449 Chronic obstructive pulmonary disease, unspecified: Secondary | ICD-10-CM | POA: Diagnosis not present

## 2019-10-20 DIAGNOSIS — M810 Age-related osteoporosis without current pathological fracture: Secondary | ICD-10-CM | POA: Diagnosis not present

## 2019-10-20 DIAGNOSIS — E781 Pure hyperglyceridemia: Secondary | ICD-10-CM | POA: Diagnosis not present

## 2019-10-20 DIAGNOSIS — E119 Type 2 diabetes mellitus without complications: Secondary | ICD-10-CM | POA: Diagnosis not present

## 2019-10-20 DIAGNOSIS — I10 Essential (primary) hypertension: Secondary | ICD-10-CM | POA: Diagnosis not present

## 2019-10-20 DIAGNOSIS — Z7984 Long term (current) use of oral hypoglycemic drugs: Secondary | ICD-10-CM | POA: Diagnosis not present

## 2019-10-20 DIAGNOSIS — J45909 Unspecified asthma, uncomplicated: Secondary | ICD-10-CM | POA: Diagnosis not present

## 2019-10-20 DIAGNOSIS — E7800 Pure hypercholesterolemia, unspecified: Secondary | ICD-10-CM | POA: Diagnosis not present

## 2019-10-20 DIAGNOSIS — E1169 Type 2 diabetes mellitus with other specified complication: Secondary | ICD-10-CM | POA: Diagnosis not present

## 2019-10-24 ENCOUNTER — Other Ambulatory Visit: Payer: Self-pay

## 2019-10-24 ENCOUNTER — Ambulatory Visit
Admission: RE | Admit: 2019-10-24 | Discharge: 2019-10-24 | Disposition: A | Discharge status: Home / Self Care | Payer: Medicare HMO | Source: Ambulatory Visit | Attending: Family Medicine | Admitting: Family Medicine

## 2019-10-24 DIAGNOSIS — M810 Age-related osteoporosis without current pathological fracture: Secondary | ICD-10-CM | POA: Diagnosis not present

## 2019-10-24 DIAGNOSIS — Z1231 Encounter for screening mammogram for malignant neoplasm of breast: Secondary | ICD-10-CM | POA: Diagnosis not present

## 2019-10-24 DIAGNOSIS — Z780 Asymptomatic menopausal state: Secondary | ICD-10-CM | POA: Diagnosis not present

## 2019-10-24 DIAGNOSIS — M8588 Other specified disorders of bone density and structure, other site: Secondary | ICD-10-CM | POA: Diagnosis not present

## 2019-11-16 ENCOUNTER — Encounter: Payer: Self-pay | Admitting: Dietician

## 2019-11-16 ENCOUNTER — Other Ambulatory Visit: Payer: Self-pay

## 2019-11-16 ENCOUNTER — Encounter: Payer: Medicare HMO | Attending: Family Medicine | Admitting: Dietician

## 2019-11-16 DIAGNOSIS — I10 Essential (primary) hypertension: Secondary | ICD-10-CM | POA: Diagnosis not present

## 2019-11-16 DIAGNOSIS — Z0000 Encounter for general adult medical examination without abnormal findings: Secondary | ICD-10-CM | POA: Diagnosis not present

## 2019-11-16 DIAGNOSIS — J449 Chronic obstructive pulmonary disease, unspecified: Secondary | ICD-10-CM | POA: Diagnosis not present

## 2019-11-16 DIAGNOSIS — E7800 Pure hypercholesterolemia, unspecified: Secondary | ICD-10-CM | POA: Diagnosis not present

## 2019-11-16 DIAGNOSIS — E1169 Type 2 diabetes mellitus with other specified complication: Secondary | ICD-10-CM | POA: Diagnosis not present

## 2019-11-16 DIAGNOSIS — E119 Type 2 diabetes mellitus without complications: Secondary | ICD-10-CM | POA: Diagnosis not present

## 2019-11-16 DIAGNOSIS — F419 Anxiety disorder, unspecified: Secondary | ICD-10-CM | POA: Diagnosis not present

## 2019-11-16 DIAGNOSIS — M810 Age-related osteoporosis without current pathological fracture: Secondary | ICD-10-CM | POA: Diagnosis not present

## 2019-11-16 DIAGNOSIS — Z7984 Long term (current) use of oral hypoglycemic drugs: Secondary | ICD-10-CM | POA: Diagnosis not present

## 2019-11-16 DIAGNOSIS — J45909 Unspecified asthma, uncomplicated: Secondary | ICD-10-CM | POA: Diagnosis not present

## 2019-11-16 NOTE — Progress Notes (Signed)
Diabetes Self-Management Education  Visit Type: First/Initial  Appt. Start Time: 1600 Appt. End Time: [id number]  11/19/2019  Doris Wyatt, identified by name and date of birth, is a 73 y.o. female with a diagnosis of Diabetes: Type 2.   ASSESSMENT Patient is here today alone.  She is here due to the advice of her MD.  She last saw a CDE in 2015.    History includes type 2 diabetes, diverticulitis, kidney stones, HTN, HLD.  She is having recent problems with diarrhea.  She was hospitalized with diverticulitis last year. Her current UBW is 112-114 lbs which has decreased from 124 lbs 2 years ago.  Discussed ways to increase caloric intake. Medications include Metformin and Glimepiride. Current A1C is unknown.  She states that she has forgotten to check her BG more since the Pandemic and states that she is having more anxiety with this.  Patient lives alone.  She states that she has very supportive neighbors and her son, daughter and grandchildren live locally.  She moved from [place] about 10 years ago.  She does not have a car but her family helps her shop and can also walk to the grocery store if needed.  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 Walks her dogs twice per day.  Height 5\' 5"  (1.651 m), weight 113 lb (51.3 kg). Body mass index is 18.8 kg/m.  Diabetes Self-Management Education - 11/16/19 1626      Visit Information   Visit Type  First/Initial      Initial Visit   Diabetes Type  Type 2    Are you currently following a meal plan?  No    Are you taking your medications as prescribed?  No   forgets at times   Date Diagnosed  about 2010      Health Coping   How would you rate your overall health?  Good      Psychosocial Assessment   Patient Belief/Attitude about Diabetes  Motivated to manage diabetes    Self-care barriers  Other (comment)   increased stress   Self-management support  Doctor's office;Family    Other persons  present  Patient    Patient Concerns  Nutrition/Meal planning    Special Needs  None    Preferred Learning Style  No preference indicated    Learning Readiness  Ready    How often do you need to have someone help you when you read instructions, pamphlets, or other written materials from your doctor or pharmacy?  1 - Never    What is the last grade level you completed in school?  11th grade      Pre-Education Assessment   Patient understands the diabetes disease and treatment process.  Needs Review    Patient understands incorporating nutritional management into lifestyle.  Needs Review    Patient undertands incorporating physical activity into lifestyle.  Needs Review    Patient understands using medications safely.  Needs Review    Patient understands monitoring blood glucose, interpreting and using results  Needs Review    Patient understands prevention, detection, and treatment of acute complications.  Needs Review    Patient understands prevention, detection, and treatment of chronic complications.  Needs Review    Patient understands how to develop strategies to address psychosocial issues.  Needs Review    Patient understands how to develop strategies to promote health/change behavior.  Needs Review      Complications   How  often do you check your blood sugar?  0 times/day (not testing)    Have you had a dilated eye exam in the past 12 months?  No    Have you had a dental exam in the past 12 months?  Yes    Are you checking your feet?  No      Dietary Intake   Breakfast  eggs, 2 slices white toast and occasional bacon OR oatmeal or [place] AND 8 ounces juice, coffee with sweet and low and LF milk    Lunch  tuna or egg salad or ham and cheese sandwich, with occasional potato salad OR soup and crackers    Snack (afternoon)  ccasional ritz crackers and cheese OR occasional cake    Dinner  leftover spaghetti    Snack (evening)  occasional ritz crackers and 4 oz wine and occasional  shrimp with cocktail sauce    Beverage(s)  coffee with sweet and low and LF milk (3-4 cups per day) orange juice, cranberry juice,      Exercise   Exercise Type  Light (walking / raking leaves)    How many days per week to you exercise?  7    How many minutes per day do you exercise?  40    Total minutes per week of exercise  280      Patient Education   Previous Diabetes Education  Yes (please comment)   2015   Nutrition management   Role of diet in the treatment of diabetes and the relationship between the three main macronutrients and blood glucose level;Meal options for control of blood glucose level and chronic complications.    Physical activity and exercise   Role of exercise on diabetes management, blood pressure control and cardiac health.;Helped patient identify appropriate exercises in relation to his/her diabetes, diabetes complications and other health issue.    Medications  Reviewed patients medication for diabetes, action, purpose, timing of dose and side effects.      Subsequent Visit   Since your last visit, are you checking your blood glucose at least once a day?  No       Individualized Plan for Diabetes Self-Management Training:   Learning Objective:  Patient will have a greater understanding of diabetes self-management. Patient education plan is to attend individual and/or group sessions per assessed needs and concerns.   Plan:   Patient Instructions  Call to get an eye appointment. Consider discussing your diarrhea with your doctor. Continue to eat breakfast, lunch, and dinner with 2-3 snacks per day. Each meal should contain a small portion of protein.  (yogurt, cheese, egg, lean meat/fish). Have a sandwich along with soup and crackers or have potato salad with your sandwich  Find things to do every day that you enjoy.  What decreases your stress? Consider decreasing your coffee.  If your intake is poor, add sugar free pudding, Glucerna, or Boost Glucose  Control 1-2 daily.    Expected Outcomes:     Education material provided: ADA - How to Thrive: A Guide for Your Journey with Diabetes, My Plate and Snack sheet  If problems or questions, patient to contact team via:  Phone and Email  Future DSME appointment:

## 2019-11-16 NOTE — Patient Instructions (Addendum)
Call to get an eye appointment. Consider discussing your diarrhea with your doctor. Continue to eat breakfast, lunch, and dinner with 2-3 snacks per day. Each meal should contain a small portion of protein.  (yogurt, cheese, egg, lean meat/fish). Have a sandwich along with soup and crackers or have potato salad with your sandwich  Find things to do every day that you enjoy.  What decreases your stress? Consider decreasing your coffee.  If your intake is poor, add sugar free pudding, Glucerna, or Boost Glucose Control 1-2 daily.

## 2019-11-19 ENCOUNTER — Encounter: Payer: Self-pay | Admitting: Dietician

## 2019-11-20 DIAGNOSIS — E1169 Type 2 diabetes mellitus with other specified complication: Secondary | ICD-10-CM | POA: Diagnosis not present

## 2019-11-20 DIAGNOSIS — E7800 Pure hypercholesterolemia, unspecified: Secondary | ICD-10-CM | POA: Diagnosis not present

## 2019-11-20 DIAGNOSIS — J449 Chronic obstructive pulmonary disease, unspecified: Secondary | ICD-10-CM | POA: Diagnosis not present

## 2019-11-20 DIAGNOSIS — M810 Age-related osteoporosis without current pathological fracture: Secondary | ICD-10-CM | POA: Diagnosis not present

## 2019-11-20 DIAGNOSIS — J45909 Unspecified asthma, uncomplicated: Secondary | ICD-10-CM | POA: Diagnosis not present

## 2019-11-20 DIAGNOSIS — I10 Essential (primary) hypertension: Secondary | ICD-10-CM | POA: Diagnosis not present

## 2019-11-20 DIAGNOSIS — Z7984 Long term (current) use of oral hypoglycemic drugs: Secondary | ICD-10-CM | POA: Diagnosis not present

## 2019-11-27 DIAGNOSIS — F419 Anxiety disorder, unspecified: Secondary | ICD-10-CM | POA: Diagnosis not present

## 2019-12-25 DIAGNOSIS — J45909 Unspecified asthma, uncomplicated: Secondary | ICD-10-CM | POA: Diagnosis not present

## 2019-12-25 DIAGNOSIS — J449 Chronic obstructive pulmonary disease, unspecified: Secondary | ICD-10-CM | POA: Diagnosis not present

## 2019-12-25 DIAGNOSIS — E7800 Pure hypercholesterolemia, unspecified: Secondary | ICD-10-CM | POA: Diagnosis not present

## 2019-12-25 DIAGNOSIS — E1169 Type 2 diabetes mellitus with other specified complication: Secondary | ICD-10-CM | POA: Diagnosis not present

## 2019-12-25 DIAGNOSIS — M810 Age-related osteoporosis without current pathological fracture: Secondary | ICD-10-CM | POA: Diagnosis not present

## 2019-12-25 DIAGNOSIS — I10 Essential (primary) hypertension: Secondary | ICD-10-CM | POA: Diagnosis not present

## 2019-12-28 ENCOUNTER — Ambulatory Visit: Payer: Self-pay

## 2020-01-04 ENCOUNTER — Ambulatory Visit: Payer: Medicare HMO

## 2020-01-04 DIAGNOSIS — M810 Age-related osteoporosis without current pathological fracture: Secondary | ICD-10-CM | POA: Diagnosis not present

## 2020-01-04 DIAGNOSIS — N644 Mastodynia: Secondary | ICD-10-CM | POA: Diagnosis not present

## 2020-01-04 DIAGNOSIS — N63 Unspecified lump in unspecified breast: Secondary | ICD-10-CM | POA: Diagnosis not present

## 2020-01-04 DIAGNOSIS — L853 Xerosis cutis: Secondary | ICD-10-CM | POA: Diagnosis not present

## 2020-01-05 ENCOUNTER — Other Ambulatory Visit: Payer: Self-pay | Admitting: Family Medicine

## 2020-01-05 DIAGNOSIS — N6320 Unspecified lump in the left breast, unspecified quadrant: Secondary | ICD-10-CM

## 2020-01-16 DIAGNOSIS — H52223 Regular astigmatism, bilateral: Secondary | ICD-10-CM | POA: Diagnosis not present

## 2020-01-16 DIAGNOSIS — H35033 Hypertensive retinopathy, bilateral: Secondary | ICD-10-CM | POA: Diagnosis not present

## 2020-01-16 DIAGNOSIS — E119 Type 2 diabetes mellitus without complications: Secondary | ICD-10-CM | POA: Diagnosis not present

## 2020-01-16 DIAGNOSIS — E7800 Pure hypercholesterolemia, unspecified: Secondary | ICD-10-CM | POA: Diagnosis not present

## 2020-01-18 ENCOUNTER — Ambulatory Visit: Payer: Self-pay

## 2020-02-06 ENCOUNTER — Ambulatory Visit
Admission: RE | Admit: 2020-02-06 | Discharge: 2020-02-06 | Disposition: A | Discharge status: Home / Self Care | Payer: Medicare HMO | Source: Ambulatory Visit | Attending: Family Medicine | Admitting: Family Medicine

## 2020-02-06 ENCOUNTER — Other Ambulatory Visit: Payer: Self-pay

## 2020-02-06 DIAGNOSIS — N6320 Unspecified lump in the left breast, unspecified quadrant: Secondary | ICD-10-CM

## 2020-02-06 DIAGNOSIS — N6489 Other specified disorders of breast: Secondary | ICD-10-CM | POA: Diagnosis not present

## 2020-02-06 DIAGNOSIS — R928 Other abnormal and inconclusive findings on diagnostic imaging of breast: Secondary | ICD-10-CM | POA: Diagnosis not present

## 2020-03-26 DIAGNOSIS — R202 Paresthesia of skin: Secondary | ICD-10-CM | POA: Diagnosis not present

## 2020-03-26 DIAGNOSIS — Z7984 Long term (current) use of oral hypoglycemic drugs: Secondary | ICD-10-CM | POA: Diagnosis not present

## 2020-03-26 DIAGNOSIS — M810 Age-related osteoporosis without current pathological fracture: Secondary | ICD-10-CM | POA: Diagnosis not present

## 2020-03-26 DIAGNOSIS — E1169 Type 2 diabetes mellitus with other specified complication: Secondary | ICD-10-CM | POA: Diagnosis not present

## 2020-03-26 DIAGNOSIS — F419 Anxiety disorder, unspecified: Secondary | ICD-10-CM | POA: Diagnosis not present

## 2020-03-26 DIAGNOSIS — I10 Essential (primary) hypertension: Secondary | ICD-10-CM | POA: Diagnosis not present

## 2020-03-26 DIAGNOSIS — E7800 Pure hypercholesterolemia, unspecified: Secondary | ICD-10-CM | POA: Diagnosis not present

## 2020-04-03 DIAGNOSIS — M810 Age-related osteoporosis without current pathological fracture: Secondary | ICD-10-CM | POA: Diagnosis not present

## 2020-04-03 DIAGNOSIS — Z7984 Long term (current) use of oral hypoglycemic drugs: Secondary | ICD-10-CM | POA: Diagnosis not present

## 2020-04-03 DIAGNOSIS — I10 Essential (primary) hypertension: Secondary | ICD-10-CM | POA: Diagnosis not present

## 2020-04-03 DIAGNOSIS — R202 Paresthesia of skin: Secondary | ICD-10-CM | POA: Diagnosis not present

## 2020-04-03 DIAGNOSIS — F419 Anxiety disorder, unspecified: Secondary | ICD-10-CM | POA: Diagnosis not present

## 2020-04-03 DIAGNOSIS — E7800 Pure hypercholesterolemia, unspecified: Secondary | ICD-10-CM | POA: Diagnosis not present

## 2020-04-03 DIAGNOSIS — E1169 Type 2 diabetes mellitus with other specified complication: Secondary | ICD-10-CM | POA: Diagnosis not present

## 2020-07-08 DIAGNOSIS — J449 Chronic obstructive pulmonary disease, unspecified: Secondary | ICD-10-CM | POA: Diagnosis not present

## 2020-07-08 DIAGNOSIS — M810 Age-related osteoporosis without current pathological fracture: Secondary | ICD-10-CM | POA: Diagnosis not present

## 2020-07-08 DIAGNOSIS — J45909 Unspecified asthma, uncomplicated: Secondary | ICD-10-CM | POA: Diagnosis not present

## 2020-07-08 DIAGNOSIS — E1169 Type 2 diabetes mellitus with other specified complication: Secondary | ICD-10-CM | POA: Diagnosis not present

## 2020-07-08 DIAGNOSIS — I10 Essential (primary) hypertension: Secondary | ICD-10-CM | POA: Diagnosis not present

## 2020-07-08 DIAGNOSIS — E7800 Pure hypercholesterolemia, unspecified: Secondary | ICD-10-CM | POA: Diagnosis not present

## 2020-07-25 DIAGNOSIS — R109 Unspecified abdominal pain: Secondary | ICD-10-CM | POA: Diagnosis not present

## 2020-07-25 DIAGNOSIS — J449 Chronic obstructive pulmonary disease, unspecified: Secondary | ICD-10-CM | POA: Diagnosis not present

## 2020-07-25 DIAGNOSIS — E7800 Pure hypercholesterolemia, unspecified: Secondary | ICD-10-CM | POA: Diagnosis not present

## 2020-07-25 DIAGNOSIS — E1169 Type 2 diabetes mellitus with other specified complication: Secondary | ICD-10-CM | POA: Diagnosis not present

## 2020-07-25 DIAGNOSIS — F419 Anxiety disorder, unspecified: Secondary | ICD-10-CM | POA: Diagnosis not present

## 2020-07-25 DIAGNOSIS — I10 Essential (primary) hypertension: Secondary | ICD-10-CM | POA: Diagnosis not present

## 2020-07-25 DIAGNOSIS — Z7984 Long term (current) use of oral hypoglycemic drugs: Secondary | ICD-10-CM | POA: Diagnosis not present

## 2020-07-25 DIAGNOSIS — R634 Abnormal weight loss: Secondary | ICD-10-CM | POA: Diagnosis not present

## 2020-09-04 DIAGNOSIS — J449 Chronic obstructive pulmonary disease, unspecified: Secondary | ICD-10-CM | POA: Diagnosis not present

## 2020-09-04 DIAGNOSIS — J45909 Unspecified asthma, uncomplicated: Secondary | ICD-10-CM | POA: Diagnosis not present

## 2020-09-04 DIAGNOSIS — E1169 Type 2 diabetes mellitus with other specified complication: Secondary | ICD-10-CM | POA: Diagnosis not present

## 2020-09-04 DIAGNOSIS — E7800 Pure hypercholesterolemia, unspecified: Secondary | ICD-10-CM | POA: Diagnosis not present

## 2020-09-04 DIAGNOSIS — I10 Essential (primary) hypertension: Secondary | ICD-10-CM | POA: Diagnosis not present

## 2020-09-16 ENCOUNTER — Emergency Department (HOSPITAL_COMMUNITY)
Admission: EM | Admit: 2020-09-16 | Discharge: 2020-09-16 | Disposition: A | Discharge status: Home / Self Care | Payer: Medicare HMO | Attending: Emergency Medicine | Admitting: Emergency Medicine

## 2020-09-16 ENCOUNTER — Encounter (HOSPITAL_COMMUNITY): Payer: Self-pay

## 2020-09-16 ENCOUNTER — Emergency Department (HOSPITAL_COMMUNITY): Payer: Medicare HMO

## 2020-09-16 DIAGNOSIS — R0602 Shortness of breath: Secondary | ICD-10-CM | POA: Insufficient documentation

## 2020-09-16 DIAGNOSIS — I10 Essential (primary) hypertension: Secondary | ICD-10-CM | POA: Diagnosis not present

## 2020-09-16 DIAGNOSIS — R059 Cough, unspecified: Secondary | ICD-10-CM | POA: Insufficient documentation

## 2020-09-16 DIAGNOSIS — J449 Chronic obstructive pulmonary disease, unspecified: Secondary | ICD-10-CM | POA: Diagnosis not present

## 2020-09-16 DIAGNOSIS — J439 Emphysema, unspecified: Secondary | ICD-10-CM | POA: Diagnosis not present

## 2020-09-16 DIAGNOSIS — Z5321 Procedure and treatment not carried out due to patient leaving prior to being seen by health care provider: Secondary | ICD-10-CM | POA: Insufficient documentation

## 2020-09-16 DIAGNOSIS — R079 Chest pain, unspecified: Secondary | ICD-10-CM | POA: Diagnosis not present

## 2020-09-16 DIAGNOSIS — R069 Unspecified abnormalities of breathing: Secondary | ICD-10-CM | POA: Diagnosis not present

## 2020-09-16 DIAGNOSIS — R531 Weakness: Secondary | ICD-10-CM | POA: Diagnosis not present

## 2020-09-16 LAB — TROPONIN I (HIGH SENSITIVITY)
Troponin I (High Sensitivity): 4 ng/L (ref <18)
Troponin I (High Sensitivity): 3 ng/L (ref <18)

## 2020-09-16 LAB — CBC
HCT: 39.9 % (ref 36.0–46.0)
Hemoglobin: 13 g/dL (ref 12.0–15.0)
MCH: 31.6 pg (ref 26.0–34.0)
MCHC: 32.6 g/dL (ref 30.0–36.0)
MCV: 97.1 fL (ref 80.0–100.0)
Platelets: 257 10*3/uL (ref 150–400)
RBC: 4.11 MIL/uL (ref 3.87–5.11)
RDW: 13.3 % (ref 11.5–15.5)
WBC: 7 10*3/uL (ref 4.0–10.5)
nRBC: 0 % (ref 0.0–0.2)

## 2020-09-16 LAB — BASIC METABOLIC PANEL
Anion gap: 9 (ref 5–15)
BUN: 10 mg/dL (ref 8–23)
CO2: 26 mmol/L (ref 22–32)
Calcium: 9.2 mg/dL (ref 8.9–10.3)
Chloride: 102 mmol/L (ref 98–111)
Creatinine, Ser: 0.56 mg/dL (ref 0.44–1.00)
GFR, Estimated: >60 mL/min (ref >60)
Glucose, Bld: 244 mg/dL — ABNORMAL HIGH (ref 70–99)
Potassium: 3.6 mmol/L (ref 3.5–5.1)
Sodium: 137 mmol/L (ref 135–145)

## 2020-09-16 NOTE — ED Notes (Signed)
Not pt vitals accident

## 2020-09-16 NOTE — ED Triage Notes (Signed)
Pt from home with ems c.o chest pain and sob that started around 630 this morning, +cough, non productive. Hx of emphysema. Pt has not been vaccinated for COVID. Pt given 324 ASA and 1 nitro with some relief of pain. Pt a.o, VSS

## 2020-09-16 NOTE — ED Notes (Signed)
Pt. Stated, I will make an appt tomorrow with my Dr.

## 2020-09-26 DIAGNOSIS — F419 Anxiety disorder, unspecified: Secondary | ICD-10-CM | POA: Diagnosis not present

## 2020-09-26 DIAGNOSIS — J45909 Unspecified asthma, uncomplicated: Secondary | ICD-10-CM | POA: Diagnosis not present

## 2020-09-26 DIAGNOSIS — J449 Chronic obstructive pulmonary disease, unspecified: Secondary | ICD-10-CM | POA: Diagnosis not present

## 2020-10-21 DIAGNOSIS — E119 Type 2 diabetes mellitus without complications: Secondary | ICD-10-CM | POA: Diagnosis not present

## 2020-10-21 DIAGNOSIS — J449 Chronic obstructive pulmonary disease, unspecified: Secondary | ICD-10-CM | POA: Diagnosis not present

## 2020-10-21 DIAGNOSIS — E1169 Type 2 diabetes mellitus with other specified complication: Secondary | ICD-10-CM | POA: Diagnosis not present

## 2020-10-21 DIAGNOSIS — J45909 Unspecified asthma, uncomplicated: Secondary | ICD-10-CM | POA: Diagnosis not present

## 2020-10-21 DIAGNOSIS — M810 Age-related osteoporosis without current pathological fracture: Secondary | ICD-10-CM | POA: Diagnosis not present

## 2020-10-21 DIAGNOSIS — I10 Essential (primary) hypertension: Secondary | ICD-10-CM | POA: Diagnosis not present

## 2020-10-21 DIAGNOSIS — E7800 Pure hypercholesterolemia, unspecified: Secondary | ICD-10-CM | POA: Diagnosis not present

## 2020-11-11 DIAGNOSIS — E1169 Type 2 diabetes mellitus with other specified complication: Secondary | ICD-10-CM | POA: Diagnosis not present

## 2020-11-18 DIAGNOSIS — E1169 Type 2 diabetes mellitus with other specified complication: Secondary | ICD-10-CM | POA: Diagnosis not present

## 2020-11-18 DIAGNOSIS — I10 Essential (primary) hypertension: Secondary | ICD-10-CM | POA: Diagnosis not present

## 2020-11-18 DIAGNOSIS — E7800 Pure hypercholesterolemia, unspecified: Secondary | ICD-10-CM | POA: Diagnosis not present

## 2020-11-18 DIAGNOSIS — F419 Anxiety disorder, unspecified: Secondary | ICD-10-CM | POA: Diagnosis not present

## 2020-11-18 DIAGNOSIS — Z7984 Long term (current) use of oral hypoglycemic drugs: Secondary | ICD-10-CM | POA: Diagnosis not present

## 2020-11-26 DIAGNOSIS — M810 Age-related osteoporosis without current pathological fracture: Secondary | ICD-10-CM | POA: Diagnosis not present

## 2020-11-26 DIAGNOSIS — J449 Chronic obstructive pulmonary disease, unspecified: Secondary | ICD-10-CM | POA: Diagnosis not present

## 2020-11-26 DIAGNOSIS — J45909 Unspecified asthma, uncomplicated: Secondary | ICD-10-CM | POA: Diagnosis not present

## 2020-11-26 DIAGNOSIS — I10 Essential (primary) hypertension: Secondary | ICD-10-CM | POA: Diagnosis not present

## 2020-11-26 DIAGNOSIS — E7800 Pure hypercholesterolemia, unspecified: Secondary | ICD-10-CM | POA: Diagnosis not present

## 2020-11-26 DIAGNOSIS — E1169 Type 2 diabetes mellitus with other specified complication: Secondary | ICD-10-CM | POA: Diagnosis not present

## 2020-12-25 DIAGNOSIS — J449 Chronic obstructive pulmonary disease, unspecified: Secondary | ICD-10-CM | POA: Diagnosis not present

## 2020-12-25 DIAGNOSIS — Z7984 Long term (current) use of oral hypoglycemic drugs: Secondary | ICD-10-CM | POA: Diagnosis not present

## 2020-12-25 DIAGNOSIS — E7800 Pure hypercholesterolemia, unspecified: Secondary | ICD-10-CM | POA: Diagnosis not present

## 2020-12-25 DIAGNOSIS — E1169 Type 2 diabetes mellitus with other specified complication: Secondary | ICD-10-CM | POA: Diagnosis not present

## 2020-12-25 DIAGNOSIS — M810 Age-related osteoporosis without current pathological fracture: Secondary | ICD-10-CM | POA: Diagnosis not present

## 2020-12-25 DIAGNOSIS — R4189 Other symptoms and signs involving cognitive functions and awareness: Secondary | ICD-10-CM | POA: Diagnosis not present

## 2020-12-25 DIAGNOSIS — I10 Essential (primary) hypertension: Secondary | ICD-10-CM | POA: Diagnosis not present

## 2020-12-25 DIAGNOSIS — J45909 Unspecified asthma, uncomplicated: Secondary | ICD-10-CM | POA: Diagnosis not present

## 2020-12-25 DIAGNOSIS — F419 Anxiety disorder, unspecified: Secondary | ICD-10-CM | POA: Diagnosis not present

## 2021-01-21 DIAGNOSIS — H524 Presbyopia: Secondary | ICD-10-CM | POA: Diagnosis not present

## 2021-01-21 DIAGNOSIS — H35033 Hypertensive retinopathy, bilateral: Secondary | ICD-10-CM | POA: Diagnosis not present

## 2021-01-22 DIAGNOSIS — M810 Age-related osteoporosis without current pathological fracture: Secondary | ICD-10-CM | POA: Diagnosis not present

## 2021-01-22 DIAGNOSIS — J449 Chronic obstructive pulmonary disease, unspecified: Secondary | ICD-10-CM | POA: Diagnosis not present

## 2021-01-22 DIAGNOSIS — J45909 Unspecified asthma, uncomplicated: Secondary | ICD-10-CM | POA: Diagnosis not present

## 2021-01-22 DIAGNOSIS — I10 Essential (primary) hypertension: Secondary | ICD-10-CM | POA: Diagnosis not present

## 2021-01-22 DIAGNOSIS — E7800 Pure hypercholesterolemia, unspecified: Secondary | ICD-10-CM | POA: Diagnosis not present

## 2021-01-22 DIAGNOSIS — E1169 Type 2 diabetes mellitus with other specified complication: Secondary | ICD-10-CM | POA: Diagnosis not present

## 2021-01-29 DIAGNOSIS — I10 Essential (primary) hypertension: Secondary | ICD-10-CM | POA: Diagnosis not present

## 2021-01-29 DIAGNOSIS — Z681 Body mass index (BMI) 19 or less, adult: Secondary | ICD-10-CM | POA: Diagnosis not present

## 2021-01-29 DIAGNOSIS — E1169 Type 2 diabetes mellitus with other specified complication: Secondary | ICD-10-CM | POA: Diagnosis not present

## 2021-01-29 DIAGNOSIS — F419 Anxiety disorder, unspecified: Secondary | ICD-10-CM | POA: Diagnosis not present

## 2021-01-29 DIAGNOSIS — R413 Other amnesia: Secondary | ICD-10-CM | POA: Diagnosis not present

## 2021-01-29 DIAGNOSIS — E7800 Pure hypercholesterolemia, unspecified: Secondary | ICD-10-CM | POA: Diagnosis not present

## 2021-01-29 DIAGNOSIS — M810 Age-related osteoporosis without current pathological fracture: Secondary | ICD-10-CM | POA: Diagnosis not present

## 2021-02-18 DIAGNOSIS — H35033 Hypertensive retinopathy, bilateral: Secondary | ICD-10-CM | POA: Diagnosis not present

## 2021-02-27 DIAGNOSIS — I10 Essential (primary) hypertension: Secondary | ICD-10-CM | POA: Diagnosis not present

## 2021-02-27 DIAGNOSIS — E7800 Pure hypercholesterolemia, unspecified: Secondary | ICD-10-CM | POA: Diagnosis not present

## 2021-02-27 DIAGNOSIS — E1169 Type 2 diabetes mellitus with other specified complication: Secondary | ICD-10-CM | POA: Diagnosis not present

## 2021-02-27 DIAGNOSIS — J45909 Unspecified asthma, uncomplicated: Secondary | ICD-10-CM | POA: Diagnosis not present

## 2021-02-27 DIAGNOSIS — J449 Chronic obstructive pulmonary disease, unspecified: Secondary | ICD-10-CM | POA: Diagnosis not present

## 2021-02-27 DIAGNOSIS — M810 Age-related osteoporosis without current pathological fracture: Secondary | ICD-10-CM | POA: Diagnosis not present

## 2021-03-05 DIAGNOSIS — E7800 Pure hypercholesterolemia, unspecified: Secondary | ICD-10-CM | POA: Diagnosis not present

## 2021-03-05 DIAGNOSIS — J449 Chronic obstructive pulmonary disease, unspecified: Secondary | ICD-10-CM | POA: Diagnosis not present

## 2021-03-05 DIAGNOSIS — I10 Essential (primary) hypertension: Secondary | ICD-10-CM | POA: Diagnosis not present

## 2021-03-05 DIAGNOSIS — E1169 Type 2 diabetes mellitus with other specified complication: Secondary | ICD-10-CM | POA: Diagnosis not present

## 2021-03-05 DIAGNOSIS — M810 Age-related osteoporosis without current pathological fracture: Secondary | ICD-10-CM | POA: Diagnosis not present

## 2021-03-05 DIAGNOSIS — J45909 Unspecified asthma, uncomplicated: Secondary | ICD-10-CM | POA: Diagnosis not present

## 2021-04-07 ENCOUNTER — Encounter: Payer: Self-pay | Admitting: Psychology

## 2021-04-09 DIAGNOSIS — J45909 Unspecified asthma, uncomplicated: Secondary | ICD-10-CM | POA: Diagnosis not present

## 2021-04-09 DIAGNOSIS — M810 Age-related osteoporosis without current pathological fracture: Secondary | ICD-10-CM | POA: Diagnosis not present

## 2021-04-09 DIAGNOSIS — I10 Essential (primary) hypertension: Secondary | ICD-10-CM | POA: Diagnosis not present

## 2021-04-09 DIAGNOSIS — E1169 Type 2 diabetes mellitus with other specified complication: Secondary | ICD-10-CM | POA: Diagnosis not present

## 2021-04-09 DIAGNOSIS — E7800 Pure hypercholesterolemia, unspecified: Secondary | ICD-10-CM | POA: Diagnosis not present

## 2021-04-09 DIAGNOSIS — J449 Chronic obstructive pulmonary disease, unspecified: Secondary | ICD-10-CM | POA: Diagnosis not present

## 2021-04-22 DIAGNOSIS — R413 Other amnesia: Secondary | ICD-10-CM | POA: Diagnosis not present

## 2021-04-22 DIAGNOSIS — R634 Abnormal weight loss: Secondary | ICD-10-CM | POA: Diagnosis not present

## 2021-04-22 DIAGNOSIS — F419 Anxiety disorder, unspecified: Secondary | ICD-10-CM | POA: Diagnosis not present

## 2021-04-22 DIAGNOSIS — E1169 Type 2 diabetes mellitus with other specified complication: Secondary | ICD-10-CM | POA: Diagnosis not present

## 2021-04-24 DIAGNOSIS — Z1389 Encounter for screening for other disorder: Secondary | ICD-10-CM | POA: Diagnosis not present

## 2021-04-24 DIAGNOSIS — Z0000 Encounter for general adult medical examination without abnormal findings: Secondary | ICD-10-CM | POA: Diagnosis not present

## 2021-05-01 ENCOUNTER — Other Ambulatory Visit: Payer: Self-pay | Admitting: Family Medicine

## 2021-05-01 DIAGNOSIS — Z1231 Encounter for screening mammogram for malignant neoplasm of breast: Secondary | ICD-10-CM

## 2021-05-01 DIAGNOSIS — M810 Age-related osteoporosis without current pathological fracture: Secondary | ICD-10-CM

## 2021-05-05 DIAGNOSIS — E7800 Pure hypercholesterolemia, unspecified: Secondary | ICD-10-CM | POA: Diagnosis not present

## 2021-05-05 DIAGNOSIS — J45909 Unspecified asthma, uncomplicated: Secondary | ICD-10-CM | POA: Diagnosis not present

## 2021-05-05 DIAGNOSIS — M810 Age-related osteoporosis without current pathological fracture: Secondary | ICD-10-CM | POA: Diagnosis not present

## 2021-05-05 DIAGNOSIS — I10 Essential (primary) hypertension: Secondary | ICD-10-CM | POA: Diagnosis not present

## 2021-05-05 DIAGNOSIS — E1169 Type 2 diabetes mellitus with other specified complication: Secondary | ICD-10-CM | POA: Diagnosis not present

## 2021-05-05 DIAGNOSIS — J449 Chronic obstructive pulmonary disease, unspecified: Secondary | ICD-10-CM | POA: Diagnosis not present

## 2021-06-04 DIAGNOSIS — E7800 Pure hypercholesterolemia, unspecified: Secondary | ICD-10-CM | POA: Diagnosis not present

## 2021-06-04 DIAGNOSIS — J45909 Unspecified asthma, uncomplicated: Secondary | ICD-10-CM | POA: Diagnosis not present

## 2021-06-04 DIAGNOSIS — M810 Age-related osteoporosis without current pathological fracture: Secondary | ICD-10-CM | POA: Diagnosis not present

## 2021-06-04 DIAGNOSIS — E1169 Type 2 diabetes mellitus with other specified complication: Secondary | ICD-10-CM | POA: Diagnosis not present

## 2021-06-04 DIAGNOSIS — J449 Chronic obstructive pulmonary disease, unspecified: Secondary | ICD-10-CM | POA: Diagnosis not present

## 2021-06-04 DIAGNOSIS — I10 Essential (primary) hypertension: Secondary | ICD-10-CM | POA: Diagnosis not present

## 2021-06-09 DIAGNOSIS — M6248 Contracture of muscle, other site: Secondary | ICD-10-CM | POA: Diagnosis not present

## 2021-06-09 DIAGNOSIS — M542 Cervicalgia: Secondary | ICD-10-CM | POA: Diagnosis not present

## 2021-06-17 DIAGNOSIS — I10 Essential (primary) hypertension: Secondary | ICD-10-CM | POA: Diagnosis not present

## 2021-06-17 DIAGNOSIS — R413 Other amnesia: Secondary | ICD-10-CM | POA: Diagnosis not present

## 2021-06-17 DIAGNOSIS — E1169 Type 2 diabetes mellitus with other specified complication: Secondary | ICD-10-CM | POA: Diagnosis not present

## 2021-07-01 ENCOUNTER — Ambulatory Visit: Payer: Medicare HMO | Admitting: Psychology

## 2021-07-01 ENCOUNTER — Ambulatory Visit (INDEPENDENT_AMBULATORY_CARE_PROVIDER_SITE_OTHER): Payer: Medicare HMO | Admitting: Psychology

## 2021-07-01 ENCOUNTER — Encounter: Payer: Self-pay | Admitting: Psychology

## 2021-07-01 ENCOUNTER — Other Ambulatory Visit: Payer: Self-pay

## 2021-07-01 DIAGNOSIS — G309 Alzheimer's disease, unspecified: Secondary | ICD-10-CM | POA: Diagnosis not present

## 2021-07-01 DIAGNOSIS — M810 Age-related osteoporosis without current pathological fracture: Secondary | ICD-10-CM | POA: Insufficient documentation

## 2021-07-01 DIAGNOSIS — F411 Generalized anxiety disorder: Secondary | ICD-10-CM | POA: Insufficient documentation

## 2021-07-01 DIAGNOSIS — F0280 Dementia in other diseases classified elsewhere without behavioral disturbance: Secondary | ICD-10-CM | POA: Diagnosis not present

## 2021-07-01 DIAGNOSIS — R4189 Other symptoms and signs involving cognitive functions and awareness: Secondary | ICD-10-CM

## 2021-07-01 HISTORY — DX: Dementia in other diseases classified elsewhere, unspecified severity, without behavioral disturbance, psychotic disturbance, mood disturbance, and anxiety: F02.80

## 2021-07-01 NOTE — Progress Notes (Signed)
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 to have exerted adequate effort during testing. Ms. Goodell expressed her intention to perform to the best of her abilities and exhibited adequate task engagement and persistence. Scores across stand-alone and embedded performance validity measures were within expectation. As such, the results of the current evaluation are believed to be a valid representation of Ms. Ullmer's current cognitive functioning.  Test Results: Ms. Mcfaul was poorly oriented at the time of the current evaluation. She incorrectly stated her age ("46") and was unable to provider her address or phone number. She was also unable to state the current year ("20 something"), month ("September"), date, day of the week, time, or current location.   Intellectual abilities based upon educational and vocational attainment were estimated to be in the below average range. Premorbid abilities were estimated to be within the lower limits of the average range based upon a single-word reading test.   Processing speed was below average. Basic attention was above average to well  above average. More complex attention (e.g., working memory) was variable, ranging from the well below average to average normative ranges. Cognitive flexibility was below average. She also performed in the average range across a task assessing safety and judgment.  Assessed receptive language abilities were average. Difficulties on this task primarily surrounded trouble following multi-step commands, potentially due to memory dysfunction impacting her ability to recall all steps. Assessed expressive language was variable. Phonemic fluency was average, semantic fluency was exceptionally low to well below average, and confrontation naming was average on a screening instrument but well below average across a more comprehensive assessment.      Assessed visuospatial/visuoconstructional abilities were exceptionally low outside of her copy of a complex figure. Points were lost on her drawing of a clock due to her including the number 12 twice, as well as incorrect hand placement.    Learning (i.e., encoding) of novel verbal information was exceptionally low. Spontaneous delayed recall (i.e., retrieval) of previously learned information was also exceptionally low. Retention rates were 0% across a story learning task, 0% across a list learning task, and 0% across a figure recall task. Performance across recognition tasks was exceptionally low to below average, suggesting limited but some evidence for information consolidation.   Results of emotional screening instruments suggested that recent symptoms of generalized anxiety  were in the mild range, while symptoms of depression were also within the mild range. A screening instrument assessing recent sleep quality suggested the presence of minimal sleep dysfunction.  Tables of Scores:   Note: This summary of test scores accompanies the interpretive report and should not be considered in isolation without reference to the appropriate sections in the text.  Descriptors are based on appropriate normative data and may be adjusted based on clinical judgment. Terms such as "Within Normal Limits" and "Outside Normal Limits" are used when a more specific description of the test score cannot be determined.       Percentile - Normative Descriptor > 98 - Exceptionally High 91-97 - Well Above Average 75-90 - Above Average 25-74 - Average 9-24 - Below Average 2-8 - Well Below Average < 2 - Exceptionally Low       Validity:   DESCRIPTOR       Dot Counting Test: --- --- Within Normal Limits  RBANS Effort Index: --- --- Within Normal Limits  WAIS-IV Reliable Digit Span: --- --- Within Normal Limits       Orientation:      Raw Score Percentile   NAB Orientation, Form 1 12/29 --- ---       Cognitive Screening:      Raw Score Percentile   SLUMS: 11/30 --- ---       RBANS, Form A: Standard Score/ Scaled Score Percentile   Total Score 64 1 Exceptionally Low  Immediate Memory 44 <1 Exceptionally Low    List Learning 1 <1 Exceptionally Low    Story Memory 2 <1 Exceptionally Low  Visuospatial/Constructional 78 7 Well Below Average    Figure Copy 9 37 Average    Line Orientation 8/20 <2 Exceptionally Low  Language 74 4 Well Below Average    Picture Naming 9/10 26-50 Average    Semantic Fluency 1 <1 Exceptionally Low  Attention 106 66 Average    Digit Span 15 95 Well Above Average    Coding 7 16 Below Average  Delayed Memory 56 <1 Exceptionally Low    List Recall 0/10 <2 Exceptionally Low    List Recognition 17/20 10-16 Below Average    Story Recall 1 <1 Exceptionally Low    Story Recognition 8/12 8-15 Below Average    Figure Recall 1 <1 Exceptionally Low    Figure Recognition 1/8 1 Exceptionally Low       Intellectual Functioning:      Standard Score Percentile   Test of Premorbid Functioning: 90 25 Average       Attention/Executive Function:     Trail Making Test (TMT): Raw Score (T Score) Percentile     Part A 68 secs.,  0 errors (37)  9 Below Average    Part B 177 secs.,  2 errors (42) 21 Below Average         Scaled Score Percentile   WAIS-IV Digit Span: 8 25 Average    Forward 13 84 Above Average    Backward 8 25 Average    Sequencing 4 2 Well Below Average       NAB Executive Functions Module, Form 1: T Score Percentile     Judgment 44 27 Average       Language:     Verbal Fluency Test: Raw Score (T Score) Percentile     Phonemic Fluency (FAS) 29 (45) 31 Average    Animal Fluency 9 (31) 3 Well Below Average  NAB Language Module, Form 1: T Score Percentile     Auditory Comprehension 49 46 Average    Naming 24/31 (34) 5 Well Below Average       Visuospatial/Visuoconstruction:      Raw Score Percentile   Clock Drawing: 6/10 --- Impaired       Mood and Personality:      Raw Score Percentile   Geriatric Depression Scale: 14 --- Mild  Geriatric Anxiety Scale: 18 --- Mild    Somatic 5 --- Minimal    Cognitive 9 --- Severe    Affective 4 --- Mild       Additional Questionnaires:      Raw Score Percentile   PROMIS Sleep Disturbance Questionnaire: 10 --- None to Slight   Informed Consent and Coding/Compliance:   The current evaluation represents a clinical evaluation for the purposes previously outlined by the referral source and is in no way reflective of a forensic evaluation.   Ms. Clews was provided with a verbal description of the nature and purpose of the present neuropsychological evaluation. Also reviewed were the foreseeable risks and/or discomforts and benefits of the procedure, limits of confidentiality, and mandatory reporting requirements of this provider. The patient was given the opportunity to ask questions and receive answers about the evaluation. Oral consent to participate was provided by the patient.   This evaluation was conducted by Christia Reading, Ph.D., licensed clinical neuropsychologist. Ms. Whipkey completed a clinical interview with Dr. Melvyn Novas, billed as one unit [phone],  and 110 minutes of cognitive testing and scoring, billed as one unit [phone] and three additional units 96139. Psychometrist Milana Kidney, B.S., assisted Dr. Melvyn Novas with test administration and scoring procedures. As a separate and discrete service, Dr. Melvyn Novas spent a total of 160 minutes in interpretation and report writing billed as one unit [phone] and two units 96133.

## 2021-07-01 NOTE — Progress Notes (Signed)
   Psychometrician Note   Cognitive testing was administered to Doris Wyatt by Milana Kidney, B.S. (psychometrist) under the supervision of Dr. Christia Reading, Ph.D., licensed psychologist on 07/01/21. Doris Wyatt did not appear overtly distressed by the testing session per behavioral observation or responses across self-report questionnaires. Rest breaks were offered.    The battery of tests administered was selected by Dr. Christia Reading, Ph.D. with consideration to Doris Wyatt's current level of functioning, the nature of her symptoms, emotional and behavioral responses during interview, level of literacy, observed level of motivation/effort, and the nature of the referral question. This battery was communicated to the psychometrist. Communication between Dr. Christia Reading, Ph.D. and the psychometrist was ongoing throughout the evaluation and Dr. Christia Reading, Ph.D. was immediately accessible at all times. Dr. Christia Reading, Ph.D. provided supervision to the psychometrist on the date of this service to the extent necessary to assure the quality of all services provided.    Doris Wyatt will return within approximately 1-2 weeks for an interactive feedback session with Dr. Melvyn Novas at which time her test performances, clinical impressions, and treatment recommendations will be reviewed in detail. Doris Wyatt understands she can contact our office should she require our assistance before this time.  A total of 110 minutes of billable time were spent face-to-face with Doris Wyatt by the psychometrist. This includes both test administration and scoring time. Billing for these services is reflected in the clinical report generated by Dr. Christia Reading, Ph.D.  This note reflects time spent with the psychometrician and does not include test scores or any clinical interpretations made by Dr. Melvyn Novas. The full report will follow in a separate note.

## 2021-07-02 ENCOUNTER — Encounter: Payer: Self-pay | Admitting: Psychology

## 2021-07-09 ENCOUNTER — Other Ambulatory Visit: Payer: Self-pay

## 2021-07-09 ENCOUNTER — Ambulatory Visit (INDEPENDENT_AMBULATORY_CARE_PROVIDER_SITE_OTHER): Payer: Medicare HMO | Admitting: Psychology

## 2021-07-09 DIAGNOSIS — G309 Alzheimer's disease, unspecified: Secondary | ICD-10-CM

## 2021-07-09 DIAGNOSIS — F0280 Dementia in other diseases classified elsewhere without behavioral disturbance: Secondary | ICD-10-CM | POA: Diagnosis not present

## 2021-07-09 NOTE — Progress Notes (Signed)
   Neuropsychology Feedback Session Doris Wyatt. [name]  Reason for Referral:   Doris Wyatt is a 75 y.o. right-handed Caucasian female referred by  Doris Wyatt, M.D. , to characterize her current cognitive functioning and assist with diagnostic clarity and treatment planning in the context of subjective cognitive decline and concerns surrounding a neurodegenerative illness.  Feedback:   Doris Wyatt completed a comprehensive neuropsychological evaluation on 07/01/2021. Please refer to that encounter for the full report and recommendations. Briefly, results suggested a profound impairment surrounding all aspects of learning and memory. An additional impairment was exhibited across semantic fluency, while performance variability was exhibited across complex attention, confrontation naming, and visuospatial abilities. Regarding etiology, the most likely cause for ongoing cognitive dysfunction based upon the results of cognitive testing is dementia due to Alzheimer's disease. Doris Wyatt had notable trouble learning new information, even with additional learning trials, and exhibited a flat learning curve across assessments. She was also fully amnestic (i.e., retention percentages of 0%) across all administered memory tasks, suggesting an ongoing and notable impairment storing newly learned information. This pattern of memory dysfunction is the hallmark characteristic of this condition. Additional dysfunction surrounding confrontation naming, semantic fluency, and visuospatial abilities are further consistent with Alzheimer's disease. Taken together, she does exhibit a fairly classic pattern.  Doris Wyatt was accompanied by her daughter during the current feedback session. Content of the current session focused on the results of her neuropsychological evaluation. Doris Wyatt was given the opportunity to ask questions and her questions were answered.  She was encouraged to reach out should additional questions arise. A copy of her report was provided at the conclusion of the visit.      40 minutes were spent conducting the current feedback session with Doris Wyatt, billed as one unit [phone].

## 2021-07-11 DIAGNOSIS — E1169 Type 2 diabetes mellitus with other specified complication: Secondary | ICD-10-CM | POA: Diagnosis not present

## 2021-07-11 DIAGNOSIS — J45909 Unspecified asthma, uncomplicated: Secondary | ICD-10-CM | POA: Diagnosis not present

## 2021-07-11 DIAGNOSIS — M810 Age-related osteoporosis without current pathological fracture: Secondary | ICD-10-CM | POA: Diagnosis not present

## 2021-07-11 DIAGNOSIS — J449 Chronic obstructive pulmonary disease, unspecified: Secondary | ICD-10-CM | POA: Diagnosis not present

## 2021-07-11 DIAGNOSIS — E7800 Pure hypercholesterolemia, unspecified: Secondary | ICD-10-CM | POA: Diagnosis not present

## 2021-07-11 DIAGNOSIS — I10 Essential (primary) hypertension: Secondary | ICD-10-CM | POA: Diagnosis not present

## 2021-07-15 DIAGNOSIS — H35033 Hypertensive retinopathy, bilateral: Secondary | ICD-10-CM | POA: Diagnosis not present

## 2021-07-15 DIAGNOSIS — H2513 Age-related nuclear cataract, bilateral: Secondary | ICD-10-CM | POA: Diagnosis not present

## 2021-07-21 ENCOUNTER — Other Ambulatory Visit: Payer: Self-pay

## 2021-07-21 ENCOUNTER — Encounter: Payer: Self-pay | Admitting: Physician Assistant

## 2021-07-21 ENCOUNTER — Ambulatory Visit: Payer: Medicare HMO | Admitting: Physician Assistant

## 2021-07-21 ENCOUNTER — Other Ambulatory Visit (INDEPENDENT_AMBULATORY_CARE_PROVIDER_SITE_OTHER): Payer: Medicare HMO

## 2021-07-21 VITALS — BP 171/75 | HR 83 | Ht 66.0 in | Wt 106.4 lb

## 2021-07-21 DIAGNOSIS — G309 Alzheimer's disease, unspecified: Secondary | ICD-10-CM

## 2021-07-21 DIAGNOSIS — R413 Other amnesia: Secondary | ICD-10-CM

## 2021-07-21 DIAGNOSIS — F0280 Dementia in other diseases classified elsewhere without behavioral disturbance: Secondary | ICD-10-CM | POA: Diagnosis not present

## 2021-07-21 DIAGNOSIS — R4189 Other symptoms and signs involving cognitive functions and awareness: Secondary | ICD-10-CM

## 2021-07-21 LAB — TSH: TSH: 3.04 u[IU]/mL (ref 0.35–5.50)

## 2021-07-21 MED ORDER — DONEPEZIL HCL 10 MG PO TABS: ORAL_TABLET | ORAL | 11 refills | Status: DC

## 2021-07-21 NOTE — Patient Instructions (Addendum)
It was a pleasure to see you today at our office.   Recommendations:  Meds: Follow up in  6  months MRI of the brain, the office of radiology to call you   Check B12 and TSH  We will start donepezil half tablet ('5mg'$ ) daily for 2  weeks.  If you are tolerating the medication, then after 2 weeks, we will increase the dose to a full tablet of 10 mg daily.  Side effects may include nausea, vomiting, diarrhea, vivid dreams, and muscle cramps.  Please call the clinic if you experience any of these symptoms.    RECOMMENDATIONS FOR ALL PATIENTS WITH MEMORY PROBLEMS: 1. Continue to exercise (Recommend 30 minutes of walking everyday, or 3 hours every week) 2. Increase social interactions - continue going to [place] and enjoy social gatherings with friends and family 3. Eat healthy, avoid fried foods and eat more fruits and vegetables 4. Maintain adequate blood pressure, blood sugar, and blood cholesterol level. Reducing the risk of stroke and cardiovascular disease also helps promoting better memory. 5. Avoid stressful situations. Live a simple life and avoid aggravations. Organize your time and prepare for the next day in anticipation. 6. Sleep well, avoid any interruptions of sleep and avoid any distractions in the bedroom that may interfere with adequate sleep quality 7. Avoid sugar, avoid sweets as there is a strong link between excessive sugar intake, diabetes, and cognitive impairment We discussed the Mediterranean diet, which has been shown to help patients reduce the risk of progressive memory disorders and reduces cardiovascular risk. This includes eating fish, eat fruits and green leafy vegetables, nuts like almonds and hazelnuts, walnuts, and also use olive oil. Avoid fast foods and fried foods as much as possible. Avoid sweets and sugar as sugar use has been linked to worsening of memory function.  There is always a concern of gradual progression of memory problems. If this is the case, then we  may need to adjust level of care according to patient needs. Support, both to the patient and caregiver, should then be put into place.    The Alzheimer[employer] Association is here all day, every day for people facing Alzheimer's disease through our free 24/7 Helpline: [phone]. The Helpline provides reliable information and support to all those who need assistance, such as individuals living with memory loss, Alzheimer's or other dementia, caregivers, health care professionals and the public.  Our highly trained and knowledgeable staff can help you with: Understanding memory loss, dementia and Alzheimer's  Medications and other treatment options  General information about aging and brain health  Skills to provide quality care and to find the best care from professionals  Legal, financial and living-arrangement decisions Our Helpline also features: Confidential care consultation provided by master's level clinicians who can help with decision-making support, crisis assistance and education on issues families face every day  Help in a caller's preferred language using our translation service that features more than 200 languages and dialects  Referrals to local community programs, services and ongoing support     FALL PRECAUTIONS: Be cautious when walking. Scan the area for obstacles that may increase the risk of trips and falls. When getting up in the mornings, sit up at the edge of the bed for a few minutes before getting out of bed. Consider elevating the bed at the head end to avoid drop of blood pressure when getting up. Walk always in a well-lit room (use night lights in the walls). Avoid area rugs or power cords  from appliances in the middle of the walkways. Use a walker or a cane if necessary and consider physical therapy for balance exercise. Get your eyesight checked regularly.  FINANCIAL OVERSIGHT: Supervision, especially oversight when making financial decisions or transactions is also  recommended.  HOME SAFETY: Consider the safety of the kitchen when operating appliances like stoves, microwave oven, and blender. Consider having supervision and share cooking responsibilities until no longer able to participate in those. Accidents with firearms and other hazards in the house should be identified and addressed as well.   ABILITY TO BE LEFT ALONE: If patient is unable to contact 911 operator, consider using LifeLine, or when the need is there, arrange for someone to stay with patients. Smoking is a fire hazard, consider supervision or cessation. Risk of wandering should be assessed by caregiver and if detected at any point, supervision and safe proof recommendations should be instituted.  MEDICATION SUPERVISION: Inability to self-administer medication needs to be constantly addressed. Implement a mechanism to ensure safe administration of the medications.   DRIVING: Regarding driving, in patients with progressive memory problems, driving will be impaired. We advise to have someone else do the driving if trouble finding directions or if minor accidents are reported. Independent driving assessment is available to determine safety of driving.   If you are interested in the driving assessment, you can contact the following:  [employer] in [place]  Felida Lockney [phone] or [phone]      [place] refers to food and lifestyle choices that are based on the traditions of countries located on the [employer]. This way of eating has been shown to help prevent certain conditions and improve outcomes for people who have chronic diseases, like kidney disease and heart disease. What are tips for following this plan? Lifestyle  Cook and eat meals together with your family, when possible. Drink enough fluid to keep your urine  clear or pale yellow. Be physically active every day. This includes: Aerobic exercise like running or swimming. Leisure activities like gardening, walking, or housework. Get 7-8 hours of sleep each night. If recommended by your health care provider, drink red wine in moderation. This means 1 glass a day for nonpregnant women and 2 glasses a day for men. A glass of wine equals 5 oz (150 mL). Reading food labels  Check the serving size of packaged foods. For foods such as rice and pasta, the serving size refers to the amount of cooked product, not dry. Check the total fat in packaged foods. Avoid foods that have saturated fat or trans fats. Check the ingredients list for added sugars, such as corn syrup. Shopping  At the grocery store, buy most of your food from the areas near the walls of the store. This includes: Fresh fruits and vegetables (produce). Grains, beans, nuts, and seeds. Some of these may be available in unpackaged forms or large amounts (in bulk). Fresh seafood. Poultry and eggs. Low-fat dairy products. Buy whole ingredients instead of prepackaged foods. Buy fresh fruits and vegetables in-season from local farmers markets. Buy frozen fruits and vegetables in resealable bags. If you do not have access to quality fresh seafood, buy precooked frozen shrimp or canned fish, such as tuna, salmon, or sardines. Buy small amounts of raw or cooked vegetables, salads, or olives from the deli or salad bar at your store. Stock your pantry so you always have certain foods on  hand, such as olive oil, canned tuna, canned tomatoes, rice, pasta, and beans. Cooking  Cook foods with extra-virgin olive oil instead of using butter or other vegetable oils. Have meat as a side dish, and have vegetables or grains as your main dish. This means having meat in small portions or adding small amounts of meat to foods like pasta or stew. Use beans or vegetables instead of meat in common dishes like chili or  lasagna. Experiment with different cooking methods. Try roasting or broiling vegetables instead of steaming or sauteing them. Add frozen vegetables to soups, stews, pasta, or rice. Add nuts or seeds for added healthy fat at each meal. You can add these to yogurt, salads, or vegetable dishes. Marinate fish or vegetables using olive oil, lemon juice, garlic, and fresh herbs. Meal planning  Plan to eat 1 vegetarian meal one day each week. Try to work up to 2 vegetarian meals, if possible. Eat seafood 2 or more times a week. Have healthy snacks readily available, such as: Vegetable sticks with hummus. Greek yogurt. Fruit and nut trail mix. Eat balanced meals throughout the week. This includes: Fruit: 2-3 servings a day Vegetables: 4-5 servings a day Low-fat dairy: 2 servings a day Fish, poultry, or lean meat: 1 serving a day Beans and legumes: 2 or more servings a week Nuts and seeds: 1-2 servings a day Whole grains: 6-8 servings a day Extra-virgin olive oil: 3-4 servings a day Limit red meat and sweets to only a few servings a month What are my food choices? Mediterranean diet Recommended Grains: Whole-grain pasta. Brown rice. Bulgar wheat. Polenta. Couscous. Whole-wheat bread. [place]. Vegetables: Artichokes. Beets. Broccoli. Cabbage. Carrots. Eggplant. Green beans. Chard. Kale. Spinach. Onions. Leeks. Peas. Squash. Tomatoes. Peppers. Radishes. Fruits: Apples. Apricots. Avocado. Berries. Bananas. Cherries. Dates. Figs. Grapes. Lemons. Melon. Oranges. Peaches. Plums. Pomegranate. Meats and other protein foods: Beans. Almonds. Sunflower seeds. Pine nuts. Peanuts. [place]. Salmon. Scallops. Shrimp. [place]. Tilapia. Clams. Oysters. Eggs. Dairy: Low-fat milk. Cheese. Greek yogurt. Beverages: Water. Red wine. Herbal tea. Fats and oils: Extra virgin olive oil. Avocado oil. Grape seed oil. Sweets and desserts: [place] yogurt with honey. Baked apples. Poached pears. Trail mix. Seasoning and  other foods: Basil. Cilantro. Coriander. Cumin. Mint. Parsley. Sage. Rosemary. Tarragon. Garlic. Oregano. Thyme. Pepper. Balsalmic vinegar. Tahini. Hummus. Tomato sauce. Olives. Mushrooms. Limit these Grains: Prepackaged pasta or rice dishes. Prepackaged cereal with added sugar. Vegetables: Deep fried potatoes (french fries). Fruits: Fruit canned in syrup. Meats and other protein foods: Beef. Pork. Lamb. Poultry with skin. Hot dogs. [place] Salines. Dairy: Ice cream. Sour cream. Whole milk. Beverages: Juice. Sugar-sweetened soft drinks. Beer. Liquor and spirits. Fats and oils: Butter. Canola oil. Vegetable oil. Beef fat (tallow). Lard. Sweets and desserts: Cookies. Cakes. Pies. Candy. Seasoning and other foods: Mayonnaise. Premade sauces and marinades. The items listed may not be a complete list. Talk with your dietitian about what dietary choices are right for you. Summary The Mediterranean diet includes both food and lifestyle choices. Eat a variety of fresh fruits and vegetables, beans, nuts, seeds, and whole grains. Limit the amount of red meat and sweets that you eat. Talk with your health care provider about whether it is safe for you to drink red wine in moderation. This means 1 glass a day for nonpregnant women and 2 glasses a day for men. A glass of wine equals 5 oz (150 mL). This information is not intended to replace advice given to you by your health care provider. Make sure  you discuss any questions you have with your health care provider. Document Released: 07/09/2016 Document Revised: 08/11/2016 Document Reviewed: 07/09/2016 Elsevier Interactive Patient Education  2017 [employer].

## 2021-07-21 NOTE — Progress Notes (Addendum)
Assessment/Plan:   Doris Wyatt is a pleasant 75 y.o. year old female with risk factors including age, hypertension, hyperlipidemia, DM2, head injury in 2016, COPD/Asthma,  anxiety, depression evaluated for new diagnosis of Major Neurocognitive Disorder due to Alzheimer's Disease  Recent MMSE on 01/29/21 was 21/30.   Recommendations:   Major Neurocognitive Disorder due to Alzheimer's Disease   MRI brain with/without contrast to assess for underlying structural abnormality and assess vascular load  Check B12, TSH Discussed the diagnosis of dementia, likely due to Alzheimer's disease. Discussed prognosis and medications used in dementia, at this point potential side effects (bradycardia, weight loss, diarrhea) likely more than potential benefits. We will start donepezil (Aricept) '5mg'$  daily for 2 weeks.  If  tolerating the medication, then after 2 weeks, we will increase the dose to '10mg'$  daily.  Side effects were discussed Discussed safety both in and out of the home.  Discussed the importance of regular daily schedule with inclusion of crossword puzzles to maintain brain function.  Continue to monitor mood with PCP.  Stay active at least 30 minutes at least 3 times a week.  Naps should be scheduled and should be no longer than 60 minutes and should not occur after 2 PM.  Mediterranean diet is recommended  Folllow up in 6 months     Subjective:    The patient is seen in neurologic consultation at the request of Hazle Coca, PhD for the evaluation of memory.  The patient is accompanied by her daughter  who supplements the history. She is a 75 y.o. year old female who has had memory issues for about 4 years, when she began retelling stories, and asking the same questions.  In Thanksgiving 2020, the patient was repetitively asking for cornstarch to her daughter, who at that point began to be worried that there was "something more to it ".  About 1 year ago, the patient began to  forget to take her medications, or remember which medication she was taking.  Her daughter has to have someone coming in monitoring this meds.  She initially felt that memory loss was related to anxiety and nervousness with testing, and that she was " never good spelling or math, having not had finished high school". The patient lives alone, and her children monitor her on a regular basis, as they live nearby.  She likes coloring books, and she dislikes crossword puzzles.  She likes to watch TV and socialize frequently.  Her mood has been always anxious, but there is no apparent depression.  She has been irritable at times especially when she cannot remember.  She reports sleeping well, but recently, she is not sure that she was having vivid dreams, or hallucinations.  She was on Seroquel, and she attributes these changes to the medicine, which are now less frequent, and these hallucinations have resolved.  They were described as "frames coming off of the wall, last episode about 1 week ago ".  She is also positive of her pocketbook, but denies paranoia, although her daughter "is not that sure ", most recently, she accused her of hiding $100 from the picture frames.  She is independent of bathing and dressing, that he had she has been showing hoarding behavior.  Her appetite is good, but has become more picky about the choices of food.  She denies trouble swallowing.  She cooks and denies leaving the stove on.  Ambulates without difficulty without walker or cane.  She had a recent mechanical fall  due to being tangled with a dog leash.  No fractures or head injury.  She has not been driving, as she lost interest in doing so.  She denies any headaches, double vision, dizziness, focal numbness or tingling, unilateral weakness or tremors.  She denies incontinence or retention, or constipation.  She likes to drink excessive amount of coffee, which may contribute to some diarrhea.  She denies anosmia.  No history of OSA,  alcohol.  She quit in 1991 the use of 3 pack a day of tobacco.  Family history for maternal grandmother with dementia.    Neurocognitive Test 07/09/21 Dr. Melvyn Novas " The results of cognitive testing is dementia due to Alzheimer's disease. Doris Wyatt had notable trouble learning new information, even with additional learning trials, and exhibited a flat learning curve across assessments. She was also fully amnestic (i.e., retention percentages of 0%) across all administered memory tasks, suggesting an ongoing and notable impairment storing newly learned information. This pattern of memory dysfunction is the hallmark characteristic of this condition. Additional dysfunction surrounding confrontation naming, semantic fluency, and visuospatial abilities are further consistent with Alzheimer's disease. Taken together, she does exhibit a fairly classic pattern.    No Known Allergies  Current Outpatient Medications  Medication Instructions   acetaminophen (TYLENOL) 650 mg, Oral, Every 6 hours PRN   alendronate (FOSAMAX) 70 mg, Every 7 days   ALPRAZolam (XANAX) 0.25 mg, At bedtime PRN   aspirin 325 mg, Daily   atorvastatin (LIPITOR) 40 mg, Every evening   donepezil (ARICEPT) 10 MG tablet Take half tablet (5 mg) daily for 2 weeks, then increase to the full tablet at 10 mg daily   ferrous sulfate 324 mg, Oral   Fluticasone-Salmeterol (ADVAIR) 100-50 MCG/DOSE AEPB 1 puff, 2 times daily   glimepiride (AMARYL) 2 mg, Oral, Daily   HYDROcodone-acetaminophen (NORCO/VICODIN) 5-325 MG tablet 1 tablet, Every 6 hours   lactobacillus (FLORANEX/LACTINEX) PACK 1 g, Oral, 3 times daily with meals   Menthol-Zinc Oxide (GOLD BOND EX) 1 application, Daily PRN   metFORMIN (GLUCOPHAGE-XR) 500 MG 24 hr tablet No dose, route, or frequency recorded.   Multiple Vitamin (MULITIVITAMIN WITH MINERALS) TABS 1 tablet, Daily   ondansetron (ZOFRAN ODT) 4 mg, Oral, Every 8 hours PRN   ondansetron (ZOFRAN ODT) 4 mg, Oral, Every 8 hours  PRN   oxybutynin (DITROPAN) 5 mg, Oral, 3 times daily   oxyCODONE-acetaminophen (PERCOCET/ROXICET) 5-325 MG tablet 1 tablet, Oral, Every 6 hours PRN   Polyethyl Glycol-Propyl Glycol (SYSTANE OP) 2 drops, Daily PRN   polyethylene glycol (MIRALAX / GLYCOLAX) 17 g, Oral, Daily   valsartan (DIOVAN) 80 mg, Daily     VITALS:   Vitals:   07/21/21 1352  BP: (!) 171/75  Pulse: 83  SpO2: 95%  Weight: 106 lb 6.4 oz (48.3 kg)  Height: '5\' 6"'$  (1.676 m)   Depression screen [facility] 2/9 11/16/2019 11/14/2014  Decreased Interest 0 0  Down, Depressed, Hopeless 1 1  PHQ - 2 Score 1 1    HEENT:  Normocephalic, atraumatic. The mucous membranes are moist. The superficial temporal arteries are without ropiness or tenderness. Cardiovascular: Regular rate and rhythm. Lungs: Clear to auscultation bilaterally. Neck: There are no carotid bruits noted bilaterally.  NEUROLOGICAL:  Orientation:  No flowsheet data found. Cranial nerves: There is good facial symmetry. Extraocular muscles are intact and visual fields are full to confrontational testing. Speech is fluent and clear. Soft palate rises symmetrically and there is no tongue deviation. Hearing is intact to conversational  tone. Tone: Tone is good throughout. Sensation: Sensation is intact to light touch and pinprick throughout. Vibration is intact at the bilateral big toe. There is no extinction with double simultaneous stimulation. There is no sensory dermatomal level identified. Coordination:  The patient has no difficulty with RAM's or FNF bilaterally. Normal finger to nose  Motor: Strength is 5/5 in the bilateral upper and lower extremities. There is no pronator drift.  There are no fasciculations noted. DTR's: Deep tendon reflexes are 2/4 at the bilateral biceps, triceps, brachioradialis, patella and achilles.  Plantar responses are downgoing bilaterally. Gait and Station: The patient is able to ambulate without difficulty. The patient is able to heel  toe walk without any difficulty. The patient is able to ambulate in a tandem fashion. The patient is able to stand in the Romberg position.    CBC Latest Ref Rng & Units 09/16/2020 11/10/2017 11/03/2017  WBC 4.0 - 10.5 K/uL 7.0 17.1(H) 6.2  Hemoglobin 12.0 - 15.0 g/dL 13.0 12.0 12.3  Hematocrit 36.0 - 46.0 % 39.9 35.6(L) 37.2  Platelets 150 - 400 K/uL 257 261 235     CMP Latest Ref Rng & Units 09/16/2020 11/10/2017 11/03/2017  Glucose 70 - 99 mg/dL 244(H) 251(H) 178(H)  BUN 8 - 23 mg/dL '10 12 11  '$ Creatinine 0.44 - 1.00 mg/dL 0.56 0.65 0.50  Sodium 135 - 145 mmol/L 137 136 141  Potassium 3.5 - 5.1 mmol/L 3.6 3.8 3.8  Chloride 98 - 111 mmol/L 102 103 109  CO2 22 - 32 mmol/L '26 23 26  '$ Calcium 8.9 - 10.3 mg/dL 9.2 8.7(L) 8.5(L)  Total Protein 6.5 - 8.1 g/dL - 6.9 -  Total Bilirubin 0.3 - 1.2 mg/dL - 0.7 -  Alkaline Phos 38 - 126 U/L - 53 -  AST 15 - 41 U/L - 17 -  ALT 14 - 54 U/L - 16 -      Thank you for allowing [place] the opportunity to participate in the care of this nice patient. Please do not hesitate to contact us for any questions or concerns.   Total time spent on today's visit was 60 minutes, including both face-to-face time and nonface-to-face time.  Time included that spent on review of records (prior notes available to me/labs/imaging if pertinent), discussing treatment and goals, answering patient's questions and coordinating care.  Cc:  Rankins, Bill Salinas, MD  Sharene Butters 07/21/2021 3:58 PM

## 2021-07-22 ENCOUNTER — Telehealth: Payer: Self-pay

## 2021-07-22 DIAGNOSIS — R4189 Other symptoms and signs involving cognitive functions and awareness: Secondary | ICD-10-CM

## 2021-07-22 DIAGNOSIS — R413 Other amnesia: Secondary | ICD-10-CM

## 2021-07-22 DIAGNOSIS — F0280 Dementia in other diseases classified elsewhere without behavioral disturbance: Secondary | ICD-10-CM

## 2021-07-22 LAB — VITAMIN B12: Vitamin B-12: 368 pg/mL (ref 211–911)

## 2021-07-22 NOTE — Telephone Encounter (Signed)
Labs redordered

## 2021-07-22 NOTE — Progress Notes (Signed)
Tried calling pt, No answer. LMOVM to call back.

## 2021-07-24 ENCOUNTER — Telehealth: Payer: Self-pay | Admitting: Physician Assistant

## 2021-07-24 NOTE — Telephone Encounter (Signed)
Pt is returning call about her mothers lab results.

## 2021-07-24 NOTE — Telephone Encounter (Signed)
Patient advised of labs and verbally understood results.

## 2021-08-07 ENCOUNTER — Ambulatory Visit
Admission: RE | Admit: 2021-08-07 | Discharge: 2021-08-07 | Disposition: A | Discharge status: Home / Self Care | Payer: Medicare HMO | Source: Ambulatory Visit | Attending: Physician Assistant | Admitting: Physician Assistant

## 2021-08-07 DIAGNOSIS — R413 Other amnesia: Secondary | ICD-10-CM | POA: Diagnosis not present

## 2021-08-07 DIAGNOSIS — I6782 Cerebral ischemia: Secondary | ICD-10-CM | POA: Diagnosis not present

## 2021-08-12 NOTE — Progress Notes (Signed)
Left message at  915 08/12/2021.

## 2021-08-12 NOTE — Progress Notes (Signed)
Patient advised of MRI results, voiced understanding  

## 2021-08-24 DIAGNOSIS — R0902 Hypoxemia: Secondary | ICD-10-CM | POA: Diagnosis not present

## 2021-08-24 DIAGNOSIS — R457 State of emotional shock and stress, unspecified: Secondary | ICD-10-CM | POA: Diagnosis not present

## 2021-08-24 DIAGNOSIS — R739 Hyperglycemia, unspecified: Secondary | ICD-10-CM | POA: Diagnosis not present

## 2021-09-19 ENCOUNTER — Other Ambulatory Visit: Payer: Self-pay | Admitting: Physician Assistant

## 2021-11-28 ENCOUNTER — Telehealth: Payer: Self-pay | Admitting: Physician Assistant

## 2021-11-28 NOTE — Telephone Encounter (Signed)
Pt's daughter called in stating the pt is going downhill pretty quickly. Her and her family feel like she might need more help. They are not sure where to go for that help or what all they need to do and would like some advice.

## 2021-11-28 NOTE — Telephone Encounter (Signed)
Family said that they will be able to make Fri Jan 6 @ 11:30 AM

## 2021-12-04 NOTE — Progress Notes (Incomplete)
Assessment/Plan:   Doris Wyatt is a pleasant 75 y.o. year old female with risk factors including age, hypertension, hyperlipidemia, DM2, head injury in 2016, COPD/Asthma,  anxiety, depression evaluated for new diagnosis of Major Neurocognitive Disorder due to Alzheimer's Disease  Recent MMSE on 01/29/21 was 21/30.   Recommendations:   Major Neurocognitive Disorder due to Alzheimer's Disease   MRI brain with/without contrast to assess for underlying structural abnormality and assess vascular load  Check B12, TSH Discussed the diagnosis of dementia, likely due to Alzheimer's disease. Discussed prognosis and medications used in dementia, at this point potential side effects (bradycardia, weight loss, diarrhea) likely more than potential benefits. We will start donepezil (Aricept) 5mg  daily for 2 weeks.  If  tolerating the medication, then after 2 weeks, we will increase the dose to 10mg  daily.  Side effects were discussed Discussed safety both in and out of the home.  Discussed the importance of regular daily schedule with inclusion of crossword puzzles to maintain brain function.  Continue to monitor mood with PCP.  Stay active at least 30 minutes at least 3 times a week.  Naps should be scheduled and should be no longer than 60 minutes and should not occur after 2 PM.  Mediterranean diet is recommended  Folllow up in 6 months     Subjective:    The patient is seen in neurologic consultation at the request of Doris Wyatt, Doris Salinas, MD for the evaluation of memory.  The patient is accompanied by her daughter  who supplements the history. She is a 76 y.o. year old female who has had memory issues for about 4 years, when she began retelling stories, and asking the same questions.  In Thanksgiving 2020, the patient was repetitively asking for cornstarch to her daughter, who at that point began to be worried that there was "something more to it ".  About 1 year ago, the patient began to  forget to take her medications, or remember which medication she was taking.  Her daughter has to have someone coming in monitoring this meds.  She initially felt that memory loss was related to anxiety and nervousness with testing, and that she was " never good spelling or math, having not had finished high school". The patient lives alone, and her children monitor her on a regular basis, as they live nearby.  She likes coloring books, and she dislikes crossword puzzles.  She likes to watch TV and socialize frequently.  Her mood has been always anxious, but there is no apparent depression.  She has been irritable at times especially when she cannot remember.  She reports sleeping well, but recently, she is not sure that she was having vivid dreams, or hallucinations.  She was on Seroquel, and she attributes these changes to the medicine, which are now less frequent, and these hallucinations have resolved.  They were described as "frames coming off of the wall, last episode about 1 week ago ".  She is also positive of her pocketbook, but denies paranoia, although her daughter "is not that sure ", most recently, she accused her of hiding $100 from the picture frames.  She is independent of bathing and dressing, that he had she has been showing hoarding behavior.  Her appetite is good, but has become more picky about the choices of food.  She denies trouble swallowing.  She cooks and denies leaving the stove on.  Ambulates without difficulty without walker or cane.  She had a recent mechanical  fall due to being tangled with a dog leash.  No fractures or head injury.  She has not been driving, as she lost interest in doing so.  She denies any headaches, double vision, dizziness, focal numbness or tingling, unilateral weakness or tremors.  She denies incontinence or retention, or constipation.  She likes to drink excessive amount of coffee, which may contribute to some diarrhea.  She denies anosmia.  No history of OSA,  alcohol.  She quit in 1991 the use of 3 pack a day of tobacco.  Family history for maternal grandmother with dementia.    Neurocognitive Test 07/09/21 Dr. Melvyn Wyatt " The results of cognitive testing is dementia due to Alzheimer's disease. Doris Wyatt had notable trouble learning new information, even with additional learning trials, and exhibited a flat learning curve across assessments. She was also fully amnestic (i.e., retention percentages of 0%) across all administered memory tasks, suggesting an ongoing and notable impairment storing newly learned information. This pattern of memory dysfunction is the hallmark characteristic of this condition. Additional dysfunction surrounding confrontation naming, semantic fluency, and visuospatial abilities are further consistent with Alzheimer's disease. Taken together, she does exhibit a fairly classic pattern.    No Known Allergies  Current Outpatient Medications  Medication Instructions   acetaminophen (TYLENOL) 650 mg, Oral, Every 6 hours PRN   alendronate (FOSAMAX) 70 mg, Every 7 days   ALPRAZolam (XANAX) 0.25 mg, At bedtime PRN   aspirin 325 mg, Daily   atorvastatin (LIPITOR) 40 mg, Every evening   donepezil (ARICEPT) 10 MG tablet TAKE HALF TABLET (5 MG) DAILY FOR 2 WEEKS, THEN INCREASE TO THE FULL TABLET AT 10 MG DAILY   ferrous sulfate 324 mg, Oral   Fluticasone-Salmeterol (ADVAIR) 100-50 MCG/DOSE AEPB 1 puff, 2 times daily   glimepiride (AMARYL) 2 mg, Oral, Daily   HYDROcodone-acetaminophen (NORCO/VICODIN) 5-325 MG tablet 1 tablet, Every 6 hours   lactobacillus (FLORANEX/LACTINEX) PACK 1 g, Oral, 3 times daily with meals   Menthol-Zinc Oxide (GOLD BOND EX) 1 application, Daily PRN   metFORMIN (GLUCOPHAGE-XR) 500 MG 24 hr tablet No dose, route, or frequency recorded.   Multiple Vitamin (MULITIVITAMIN WITH MINERALS) TABS 1 tablet, Daily   ondansetron (ZOFRAN ODT) 4 mg, Oral, Every 8 hours PRN   ondansetron (ZOFRAN ODT) 4 mg, Oral, Every 8 hours  PRN   oxybutynin (DITROPAN) 5 mg, Oral, 3 times daily   oxyCODONE-acetaminophen (PERCOCET/ROXICET) 5-325 MG tablet 1 tablet, Oral, Every 6 hours PRN   Polyethyl Glycol-Propyl Glycol (SYSTANE OP) 2 drops, Daily PRN   polyethylene glycol (MIRALAX / GLYCOLAX) 17 g, Oral, Daily   valsartan (DIOVAN) 80 mg, Daily     VITALS:   There were no vitals filed for this visit.  Depression screen [facility] 2/9 11/16/2019 11/14/2014  Decreased Interest 0 0  Down, Depressed, Hopeless 1 1  PHQ - 2 Score 1 1    HEENT:  Normocephalic, atraumatic. The mucous membranes are moist. The superficial temporal arteries are without ropiness or tenderness. Cardiovascular: Regular rate and rhythm. Lungs: Clear to auscultation bilaterally. Neck: There are no carotid bruits noted bilaterally.  NEUROLOGICAL:  Orientation:  No flowsheet data found. Cranial nerves: There is good facial symmetry. Extraocular muscles are intact and visual fields are full to confrontational testing. Speech is fluent and clear. Soft palate rises symmetrically and there is no tongue deviation. Hearing is intact to conversational tone. Tone: Tone is good throughout. Sensation: Sensation is intact to light touch and pinprick throughout. Vibration is intact at the  bilateral big toe. There is no extinction with double simultaneous stimulation. There is no sensory dermatomal level identified. Coordination:  The patient has no difficulty with RAM's or FNF bilaterally. Normal finger to nose  Motor: Strength is 5/5 in the bilateral upper and lower extremities. There is no pronator drift.  There are no fasciculations noted. DTR's: Deep tendon reflexes are 2/4 at the bilateral biceps, triceps, brachioradialis, patella and achilles.  Plantar responses are downgoing bilaterally. Gait and Station: The patient is able to ambulate without difficulty. The patient is able to heel toe walk without any difficulty. The patient is able to ambulate in a tandem fashion.  The patient is able to stand in the Romberg position.    CBC Latest Ref Rng & Units 09/16/2020 11/10/2017 11/03/2017  WBC 4.0 - 10.5 K/uL 7.0 17.1(H) 6.2  Hemoglobin 12.0 - 15.0 g/dL 13.0 12.0 12.3  Hematocrit 36.0 - 46.0 % 39.9 35.6(L) 37.2  Platelets 150 - 400 K/uL 257 261 235     CMP Latest Ref Rng & Units 09/16/2020 11/10/2017 11/03/2017  Glucose 70 - 99 mg/dL 244(H) 251(H) 178(H)  BUN 8 - 23 mg/dL 10 12 11   Creatinine 0.44 - 1.00 mg/dL 0.56 0.65 0.50  Sodium 135 - 145 mmol/L 137 136 141  Potassium 3.5 - 5.1 mmol/L 3.6 3.8 3.8  Chloride 98 - 111 mmol/L 102 103 109  CO2 22 - 32 mmol/L 26 23 26   Calcium 8.9 - 10.3 mg/dL 9.2 8.7(L) 8.5(L)  Total Protein 6.5 - 8.1 g/dL - 6.9 -  Total Bilirubin 0.3 - 1.2 mg/dL - 0.7 -  Alkaline Phos 38 - 126 U/L - 53 -  AST 15 - 41 U/L - 17 -  ALT 14 - 54 U/L - 16 -      Thank you for allowing [place] the opportunity to participate in the care of this nice patient. Please do not hesitate to contact us for any questions or concerns.   Total time spent on today's visit was 60 minutes, including both face-to-face time and nonface-to-face time.  Time included that spent on review of records (prior notes available to me/labs/imaging if pertinent), discussing treatment and goals, answering patient's questions and coordinating care.  Cc:  Doris Wyatt, Doris Salinas, MD  Sharene Butters 12/04/2021 3:09 PM

## 2021-12-05 ENCOUNTER — Ambulatory Visit: Payer: Medicare HMO | Admitting: Physician Assistant

## 2021-12-08 ENCOUNTER — Encounter: Payer: Self-pay | Admitting: Physician Assistant

## 2021-12-08 ENCOUNTER — Other Ambulatory Visit: Payer: Self-pay

## 2021-12-08 ENCOUNTER — Ambulatory Visit: Payer: Medicare HMO | Admitting: Physician Assistant

## 2021-12-08 VITALS — BP 156/67 | HR 102 | Ht 79.0 in | Wt 107.0 lb

## 2021-12-08 DIAGNOSIS — F22 Delusional disorders: Secondary | ICD-10-CM | POA: Diagnosis not present

## 2021-12-08 DIAGNOSIS — G309 Alzheimer's disease, unspecified: Secondary | ICD-10-CM

## 2021-12-08 DIAGNOSIS — F0280 Dementia in other diseases classified elsewhere without behavioral disturbance: Secondary | ICD-10-CM | POA: Diagnosis not present

## 2021-12-08 DIAGNOSIS — F0390 Unspecified dementia without behavioral disturbance: Secondary | ICD-10-CM

## 2021-12-08 DIAGNOSIS — R441 Visual hallucinations: Secondary | ICD-10-CM | POA: Diagnosis not present

## 2021-12-08 MED ORDER — DONEPEZIL HCL 10 MG PO TABS: ORAL_TABLET | ORAL | 11 refills | Status: DC

## 2021-12-08 MED ORDER — DIVALPROEX SODIUM 125 MG PO DR TAB: 125.0000 mg | DELAYED_RELEASE_TABLET | Freq: Every day | ORAL | 11 refills | Status: DC

## 2021-12-08 NOTE — Patient Instructions (Signed)
Start Depakote 125 mg nightly Follow up in March as scheduled Continue Donepezil 10 mg daily

## 2021-12-08 NOTE — Progress Notes (Signed)
Assessment/Plan:    Dementia, likely mixed, due to Alzheimer's disease    Recommendations:  Discussed safety both in and out of the home.  Discussed the importance of regular daily schedule with inclusion of crossword puzzles to maintain brain function.  Stay active at least 30 minutes at least 3 times a week.  Naps should be scheduled and should be no longer than 60 minutes and should not occur after 2 PM.  Mediterranean diet is recommended  Control cardiovascular risk factors  Continue donepezil 10 mg daily Side effects were discussed Start Depakote 125 mg nightly for hallucinations, and mood Follow up in 2 months at prior scheduled   Case discussed with Dr. Delice Lesch who agrees with the plan     Subjective:    Doris Wyatt  is a very pleasant 76 y.o. year old female with a history of hypertension, hyperlipidemia, DM2, head injury in 2016, COPD/Asthma,  anxiety, depression evaluated for new diagnosis of Major Neurocognitive Disorder due to Alzheimer's Disease  Recent MMSE on 01/29/21 was 21/30.  MRI of the brain without contrast on 08/07/2021 was remarkable for moderate chronic small vessel ischemic changes of the cerebral hemispheric white matter.  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  No acute changes were seen.  Patient is on donepezil 10 mg daily since 09/22/2021. Patient is  seen prior to her scheduled visit in March of this year, due to changes in her cognitive status.  Her daughter reports that "she is going downhill quickly, and I do not know what to do ". The patient lives alone, and her children monitor her on a regular basis, as they live nearby.  She likes coloring books and crossword puzzles.  She likes to watch TV and socialize frequently.  Her mood has been always anxious, but since her last visit, her anxiety has increased, and there is a component of depression as well.  She has been irritable  at times especially when she cannot remember.  She reports sleeping well, but recently, she is not sure that she was having vivid dreams, or hallucinations.  Her daughter reports that the patient has seen her daughter-in-law visiting her and cleaning the house, when in truth, she has not been in to her house in about 2 years.  She is also beginning to blame other family members for hiding her stuff.  She is independent of bathing and dressing, that he had she has been showing hoarding behavior.  It is hard to convince her to bathe at times.  Her appetite is good, but has become more picky about the choices of food.  She denies trouble swallowing.  She cooks and denies leaving the stove on.  Ambulates without difficulty without walker or cane.  She has fallen out of the bed several times, heating always the right lower rib, which has caused her some pain.  In the recent past, she had an x-ray in the area without any fractures.  This is being followed by her PCP.  No head injury.  She has not been driving, as she lost interest in doing so.  She denies any headaches, double vision, dizziness, focal numbness or tingling, unilateral weakness or tremors.  She denies incontinence or retention, or constipation.  She likes to drink excessive amount of coffee, which may contribute to some diarrhea and mild heartburn.  She denies anosmia.   Initial evaluation 07/21/2021 the patient is seen in neurologic  consultation at the request of Rankins, Bill Salinas, MD for the evaluation of memory.  The patient is accompanied by her daughter  who supplements the history. She is a 76 y.o. year old female who has had memory issues for about 4 years, when she began retelling stories, and asking the same questions.  In Thanksgiving 2020, the patient was repetitively asking for cornstarch to her daughter, who at that point began to be worried that there was "something more to it ".  About 1 year ago, the patient began to forget to take her  medications, or remember which medication she was taking.  Her daughter has to have someone coming in monitoring this meds.  She initially felt that memory loss was related to anxiety and nervousness with testing, and that she was " never good spelling or math, having not had finished high school". The patient lives alone, and her children monitor her on a regular basis, as they live nearby.  She likes coloring books, and she dislikes crossword puzzles.  She likes to watch TV and socialize frequently.  Her mood has been always anxious, but there is no apparent depression.  She has been irritable at times especially when she cannot remember.  She reports sleeping well, but recently, she is not sure that she was having vivid dreams, or hallucinations.  She was on Seroquel, and she attributes these changes to the medicine, which are now less frequent, and these hallucinations have resolved.  They were described as "frames coming off of the wall, last episode about 1 week ago ".  She is also positive of her pocketbook, but denies paranoia, although her daughter "is not that sure ", most recently, she accused her of hiding $100 from the picture frames.  She is independent of bathing and dressing, that he had she has been showing hoarding behavior.  Her appetite is good, but has become more picky about the choices of food.  She denies trouble swallowing.  She cooks and denies leaving the stove on.  Ambulates without difficulty without walker or cane.  She had a recent mechanical fall due to being tangled with a dog leash.  No fractures or head injury.  She has not been driving, as she lost interest in doing so.  She denies any headaches, double vision, dizziness, focal numbness or tingling, unilateral weakness or tremors.  She denies incontinence or retention, or constipation.  She likes to drink excessive amount of coffee, which may contribute to some diarrhea.  She denies anosmia.  No history of OSA, alcohol.  She quit  in 1991 the use of 3 pack a day of tobacco.  Family history for maternal grandmother with dementia.    Neurocognitive Test 07/09/21 Dr. Melvyn Novas " The results of cognitive testing is dementia due to Alzheimer's disease. Doris Wyatt had notable trouble learning new information, even with additional learning trials, and exhibited a flat learning curve across assessments. She was also fully amnestic (i.e., retention percentages of 0%) across all administered memory tasks, suggesting an ongoing and notable impairment storing newly learned information. This pattern of memory dysfunction is the hallmark characteristic of this condition. Additional dysfunction surrounding confrontation naming, semantic fluency, and visuospatial abilities are further consistent with Alzheimer's disease. Taken together, she does exhibit a fairly classic pattern.     PREVIOUS MEDICATIONS:   CURRENT MEDICATIONS:  Outpatient Encounter Medications as of 12/08/2021  Medication Sig   aspirin 325 MG tablet Take 325 mg by mouth daily.   atorvastatin (LIPITOR)  40 MG tablet Take 40 mg by mouth every evening.    divalproex (DEPAKOTE) 125 MG DR tablet Take 1 tablet (125 mg total) by mouth at bedtime.   donepezil (ARICEPT) 10 MG tablet Take 1 tablet  at night   glimepiride (AMARYL) 2 MG tablet Take 2 mg by mouth daily.   metFORMIN (GLUCOPHAGE-XR) 500 MG 24 hr tablet    acetaminophen (TYLENOL) 325 MG tablet Take 2 tablets (650 mg total) by mouth every 6 (six) hours as needed for mild pain (or Fever >/= 101). (Patient not taking: Reported on 07/21/2021)   alendronate (FOSAMAX) 70 MG tablet Take 70 mg by mouth every 7 (seven) days. Take with a full glass of water on an empty stomach. Takes on Tuesday. (Patient not taking: Reported on 07/21/2021)   ALPRAZolam (XANAX) 0.25 MG tablet Take 0.25 mg by mouth at bedtime as needed for anxiety. (Patient not taking: Reported on 07/21/2021)   ferrous sulfate 324 MG TBEC Take 324 mg by mouth.    Fluticasone-Salmeterol (ADVAIR) 100-50 MCG/DOSE AEPB Inhale 1 puff 2 (two) times daily into the lungs. (Patient not taking: Reported on 07/21/2021)   HYDROcodone-acetaminophen (NORCO/VICODIN) 5-325 MG tablet Take 1 tablet every 6 (six) hours by mouth. (Patient not taking: Reported on 07/21/2021)   lactobacillus (FLORANEX/LACTINEX) PACK Take 1 packet (1 g total) by mouth 3 (three) times daily with meals. (Patient not taking: Reported on 07/21/2021)   Menthol-Zinc Oxide (GOLD BOND EX) Apply 1 application topically daily as needed (for feet and hands). (Patient not taking: Reported on 07/21/2021)   Multiple Vitamin (MULITIVITAMIN WITH MINERALS) TABS Take 1 tablet by mouth daily. (Patient not taking: Reported on 07/21/2021)   ondansetron (ZOFRAN ODT) 4 MG disintegrating tablet Take 1 tablet (4 mg total) every 8 (eight) hours as needed by mouth for nausea or vomiting. (Patient not taking: No sig reported)   ondansetron (ZOFRAN ODT) 4 MG disintegrating tablet Take 1 tablet (4 mg total) by mouth every 8 (eight) hours as needed for nausea or vomiting. (Patient not taking: No sig reported)   oxybutynin (DITROPAN) 5 MG tablet Take 1 tablet (5 mg total) by mouth 3 (three) times daily. (Patient not taking: No sig reported)   oxyCODONE-acetaminophen (PERCOCET/ROXICET) 5-325 MG tablet Take 1 tablet by mouth every 6 (six) hours as needed for severe pain. (Patient not taking: No sig reported)   Polyethyl Glycol-Propyl Glycol (SYSTANE OP) Place 2 drops into both eyes daily as needed (DRY EYES). (Patient not taking: Reported on 07/21/2021)   polyethylene glycol (MIRALAX / GLYCOLAX) packet Take 17 g by mouth daily. (Patient not taking: Reported on 07/21/2021)   valsartan (DIOVAN) 80 MG tablet Take 80 mg by mouth daily.   [DISCONTINUED] donepezil (ARICEPT) 10 MG tablet TAKE HALF TABLET (5 MG) DAILY FOR 2 WEEKS, THEN INCREASE TO THE FULL TABLET AT 10 MG DAILY   No facility-administered encounter medications on file as of  12/08/2021.     Objective:     PHYSICAL EXAMINATION:    VITALS:   Vitals:   12/08/21 1508  BP: (!) 156/67  Pulse: (!) 102  SpO2: 97%  Weight: 107 lb (48.5 kg)  Height: 6\' 7"  (2.007 m)    GEN:  The patient appears stated age and is in NAD. HEENT:  Normocephalic, atraumatic.   Neurological examination:  General: NAD, well-groomed, appears stated age. Orientation: The patient is alert. Oriented to person, place and date Cranial nerves: There is good facial symmetry.The speech is fluent and clear. No aphasia or  dysarthria. Fund of knowledge is appropriate. Recent and remote memory are impaired. Attention and concentration are reduced.  Able to name objects and repeat phrases.  Hearing is intact to conversational tone.    Sensation: Sensation is intact to light touch throughout Motor: Strength is at least antigravity x4. Tremors: none  DTR's 2/4 in UE/LE    No flowsheet data found. No flowsheet data found.  No flowsheet data found.     Movement examination: Tone: There is normal tone in the UE/LE Abnormal movements:  no tremor.  No myoclonus.  No asterixis.   Coordination:  There is no decremation with RAM's. Normal finger to nose  Gait and Station: The patient has no difficulty arising out of a deep-seated chair without the use of the hands. The patient's stride length is good.  Gait is cautious and narrow.     Total time spent on today's visit was 60 minutes, including both face-to-face time and nonface-to-face time. Time included that spent on review of records (prior notes available to me/labs/imaging if pertinent), discussing treatment and goals, answering patient's questions and coordinating care.  Cc:  Rankins, Bill Salinas, MD Sharene Butters, PA-C

## 2021-12-23 DIAGNOSIS — E1165 Type 2 diabetes mellitus with hyperglycemia: Secondary | ICD-10-CM | POA: Diagnosis not present

## 2021-12-23 DIAGNOSIS — R5381 Other malaise: Secondary | ICD-10-CM | POA: Diagnosis not present

## 2021-12-23 DIAGNOSIS — Z9181 History of falling: Secondary | ICD-10-CM | POA: Diagnosis not present

## 2021-12-23 DIAGNOSIS — G309 Alzheimer's disease, unspecified: Secondary | ICD-10-CM | POA: Diagnosis not present

## 2021-12-23 DIAGNOSIS — E1169 Type 2 diabetes mellitus with other specified complication: Secondary | ICD-10-CM | POA: Diagnosis not present

## 2021-12-23 DIAGNOSIS — I10 Essential (primary) hypertension: Secondary | ICD-10-CM | POA: Diagnosis not present

## 2022-01-26 DIAGNOSIS — H35033 Hypertensive retinopathy, bilateral: Secondary | ICD-10-CM | POA: Diagnosis not present

## 2022-01-26 DIAGNOSIS — H524 Presbyopia: Secondary | ICD-10-CM | POA: Diagnosis not present

## 2022-01-28 ENCOUNTER — Ambulatory Visit: Payer: Medicare HMO | Admitting: Physician Assistant

## 2022-01-28 ENCOUNTER — Encounter: Payer: Self-pay | Admitting: Physician Assistant

## 2022-01-28 ENCOUNTER — Other Ambulatory Visit: Payer: Self-pay

## 2022-01-28 VITALS — BP 140/59 | HR 102 | Resp 18 | Ht 66.0 in | Wt 107.0 lb

## 2022-01-28 DIAGNOSIS — G309 Alzheimer's disease, unspecified: Secondary | ICD-10-CM

## 2022-01-28 DIAGNOSIS — F02818 Dementia in other diseases classified elsewhere, unspecified severity, with other behavioral disturbance: Secondary | ICD-10-CM

## 2022-01-28 DIAGNOSIS — F0280 Dementia in other diseases classified elsewhere without behavioral disturbance: Secondary | ICD-10-CM

## 2022-01-28 DIAGNOSIS — G301 Alzheimer's disease with late onset: Secondary | ICD-10-CM | POA: Diagnosis not present

## 2022-01-28 MED ORDER — DIVALPROEX SODIUM 125 MG PO DR TAB: 125.0000 mg | DELAYED_RELEASE_TABLET | Freq: Two times a day (BID) | ORAL | 11 refills | Status: DC

## 2022-01-28 NOTE — Patient Instructions (Addendum)
Increase Depakote 125 mg twice a day  ?Follow up in 6 months  ?Continue Donepezil 10 mg daily  ?Social Work appointment to discuss insurance coverage for [place]  ?

## 2022-01-28 NOTE — Progress Notes (Signed)
Assessment/Plan:    Dementia, likely mixed, due to Alzheimer's disease    Recommendations:  Discussed safety both in and out of the home.  Discussed the importance of regular daily schedule with inclusion of crossword puzzles to maintain brain function.  Stay active at least 30 minutes at least 3 times a week.  Naps should be scheduled and should be no longer than 60 minutes and should not occur after 2 PM.  Mediterranean diet is recommended  Control cardiovascular risk factors  Continue donepezil 10 mg daily Side effects were discussed  Increase Depakote to 125 mg bid  for hallucinations, and mood. Side effects discussed  Referral to social work to discuss insurance coverage for home health nursing, for safety reasons, as patient lives alone, and has shown some aggressive behavior. Follow up in 6 months    Case discussed with Dr. Delice Lesch who agrees with the plan     Subjective:    Doris Wyatt  is a very pleasant 76 y.o. year old female with a history of hypertension, hyperlipidemia, DM2, head injury in 2016, COPD/Asthma,  anxiety, depression evaluated for new diagnosis of Major Neurocognitive Disorder due to Alzheimer's Disease  Recent MMSE on 01/29/21 was 21/30.  MRI of the brain without contrast on 08/07/2021 was remarkable for moderate chronic small vessel ischemic changes of the cerebral hemispheric white matter.  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  No acute changes were seen.  Patient is on donepezil 10 mg daily since 09/22/2021. She was last seen on 12/08/21, started on Depakote 125 mg nightly for mood stabilization, tolerating well.  However, later in the day, she becomes more irritable, and she has even been aggressive towards her dog, for which her daughter had to take him away from her, which cause significant friction between her and her daughter.  "I am mad at her ".  Her mood has always been  anxious, but her anxiety has increased at, with a component of depression as priorly mentioned.  She is here with her son who supplements the history. The patient lives alone, and her children monitor her on a regular basis, as they live nearby.  She likes coloring books and crossword puzzles, watch TV and socialize frequently.  Her mood has been always anxious, but since her last visit, her anxiety has increased, and there is a component of depression as well. She reports sleeping well, but recently, she is not sure that she was having vivid dreams, "2 strange women and men in my room" . Denies hallucinations. She is also beginning to blame other family members for hiding her stuff.  She is independent of bathing and dressing, that he had she has been showing hoarding behavior.   It is hard to convince her to bathe at times.  Her appetite is good, but has become more picky about the choices of food.  She denies trouble swallowing.  She cooks and denies leaving the stove on.  Ambulates without difficulty without walker or cane, without recent falls or head injuries.  She has lost interest in driving. She denies any headaches, double vision, dizziness, focal numbness or tingling, unilateral weakness or tremors. She denies incontinence or retention, or constipation.  She likes to drink excessive amount of coffee, which may contribute to some diarrhea and mild heartburn. She denies anosmia.    Initial evaluation 07/21/2021 the patient is seen in neurologic consultation at the request of Rankins,  Bill Salinas, MD for the evaluation of memory.  The patient is accompanied by her daughter  who supplements the history. She is a 76 y.o. year old female who has had memory issues for about 4 years, when she began retelling stories, and asking the same questions.  In Thanksgiving 2020, the patient was repetitively asking for cornstarch to her daughter, who at that point began to be worried that there was "something more to  it ".  About 1 year ago, the patient began to forget to take her medications, or remember which medication she was taking.  Her daughter has to have someone coming in monitoring this meds.  She initially felt that memory loss was related to anxiety and nervousness with testing, and that she was " never good spelling or math, having not had finished high school". The patient lives alone, and her children monitor her on a regular basis, as they live nearby.  She likes coloring books, and she dislikes crossword puzzles.  She likes to watch TV and socialize frequently.  Her mood has been always anxious, but there is no apparent depression.  She has been irritable at times especially when she cannot remember.  She reports sleeping well, but recently, she is not sure that she was having vivid dreams, or hallucinations.  She was on Seroquel, and she attributes these changes to the medicine, which are now less frequent, and these hallucinations have resolved.  They were described as "frames coming off of the wall, last episode about 1 week ago ".  She is also positive of her pocketbook, but denies paranoia, although her daughter "is not that sure ", most recently, she accused her of hiding $100 from the picture frames.  She is independent of bathing and dressing, that he had she has been showing hoarding behavior.  Her appetite is good, but has become more picky about the choices of food.  She denies trouble swallowing.  She cooks and denies leaving the stove on.  Ambulates without difficulty without walker or cane.  She had a recent mechanical fall due to being tangled with a dog leash.  No fractures or head injury.  She has not been driving, as she lost interest in doing so.  She denies any headaches, double vision, dizziness, focal numbness or tingling, unilateral weakness or tremors.  She denies incontinence or retention, or constipation.  She likes to drink excessive amount of coffee, which may contribute to some  diarrhea.  She denies anosmia.  No history of OSA, alcohol.  She quit in 1991 the use of 3 pack a day of tobacco.  Family history for maternal grandmother with dementia.    Neurocognitive Test 07/09/21 Dr. Melvyn Novas " The results of cognitive testing is dementia due to Alzheimer's disease. Doris Wyatt had notable trouble learning new information, even with additional learning trials, and exhibited a flat learning curve across assessments. She was also fully amnestic (i.e., retention percentages of 0%) across all administered memory tasks, suggesting an ongoing and notable impairment storing newly learned information. This pattern of memory dysfunction is the hallmark characteristic of this condition. Additional dysfunction surrounding confrontation naming, semantic fluency, and visuospatial abilities are further consistent with Alzheimer's disease. Taken together, she does exhibit a fairly classic pattern.     PREVIOUS MEDICATIONS:   CURRENT MEDICATIONS:  Outpatient Encounter Medications as of 01/28/2022  Medication Sig   acetaminophen (TYLENOL) 325 MG tablet Take 2 tablets (650 mg total) by mouth every 6 (six) hours as needed for  mild pain (or Fever >/= 101). (Patient not taking: Reported on 07/21/2021)   alendronate (FOSAMAX) 70 MG tablet Take 70 mg by mouth every 7 (seven) days. Take with a full glass of water on an empty stomach. Takes on Tuesday. (Patient not taking: Reported on 07/21/2021)   ALPRAZolam (XANAX) 0.25 MG tablet Take 0.25 mg by mouth at bedtime as needed for anxiety. (Patient not taking: Reported on 07/21/2021)   aspirin 325 MG tablet Take 325 mg by mouth daily.   atorvastatin (LIPITOR) 40 MG tablet Take 40 mg by mouth every evening.    divalproex (DEPAKOTE) 125 MG DR tablet Take 1 tablet (125 mg total) by mouth 2 (two) times daily.   donepezil (ARICEPT) 10 MG tablet Take 1 tablet  at night   ferrous sulfate 324 MG TBEC Take 324 mg by mouth.   Fluticasone-Salmeterol (ADVAIR) 100-50  MCG/DOSE AEPB Inhale 1 puff 2 (two) times daily into the lungs. (Patient not taking: Reported on 07/21/2021)   glimepiride (AMARYL) 2 MG tablet Take 2 mg by mouth daily.   HYDROcodone-acetaminophen (NORCO/VICODIN) 5-325 MG tablet Take 1 tablet every 6 (six) hours by mouth. (Patient not taking: Reported on 07/21/2021)   lactobacillus (FLORANEX/LACTINEX) PACK Take 1 packet (1 g total) by mouth 3 (three) times daily with meals. (Patient not taking: Reported on 07/21/2021)   Menthol-Zinc Oxide (GOLD BOND EX) Apply 1 application topically daily as needed (for feet and hands). (Patient not taking: Reported on 07/21/2021)   metFORMIN (GLUCOPHAGE-XR) 500 MG 24 hr tablet    Multiple Vitamin (MULITIVITAMIN WITH MINERALS) TABS Take 1 tablet by mouth daily. (Patient not taking: Reported on 07/21/2021)   ondansetron (ZOFRAN ODT) 4 MG disintegrating tablet Take 1 tablet (4 mg total) every 8 (eight) hours as needed by mouth for nausea or vomiting. (Patient not taking: No sig reported)   ondansetron (ZOFRAN ODT) 4 MG disintegrating tablet Take 1 tablet (4 mg total) by mouth every 8 (eight) hours as needed for nausea or vomiting. (Patient not taking: No sig reported)   oxybutynin (DITROPAN) 5 MG tablet Take 1 tablet (5 mg total) by mouth 3 (three) times daily. (Patient not taking: No sig reported)   oxyCODONE-acetaminophen (PERCOCET/ROXICET) 5-325 MG tablet Take 1 tablet by mouth every 6 (six) hours as needed for severe pain. (Patient not taking: No sig reported)   Polyethyl Glycol-Propyl Glycol (SYSTANE OP) Place 2 drops into both eyes daily as needed (DRY EYES). (Patient not taking: Reported on 07/21/2021)   polyethylene glycol (MIRALAX / GLYCOLAX) packet Take 17 g by mouth daily. (Patient not taking: Reported on 07/21/2021)   valsartan (DIOVAN) 80 MG tablet Take 80 mg by mouth daily.   [DISCONTINUED] divalproex (DEPAKOTE) 125 MG DR tablet Take 1 tablet (125 mg total) by mouth at bedtime.   [DISCONTINUED] divalproex  (DEPAKOTE) 125 MG DR tablet Take 1 tablet (125 mg total) by mouth 2 (two) times daily.   No facility-administered encounter medications on file as of 01/28/2022.     Objective:     PHYSICAL EXAMINATION:    VITALS:   Vitals:   01/28/22 1541  BP: (!) 140/59  Pulse: (!) 102  Resp: 18  SpO2: 96%  Weight: 107 lb (48.5 kg)  Height: 5\' 6"  (1.676 m)     GEN:  The patient appears stated age and is in NAD. HEENT:  Normocephalic, atraumatic.   Neurological examination:  General: NAD, well-groomed, appears stated age.  Very anxious appearing Orientation: The patient is alert. Oriented to person, place  and date Cranial nerves: There is good facial symmetry.The speech is fluent and clear. No aphasia or dysarthria. Fund of knowledge is appropriate. Recent and remote memory are impaired. Attention and concentration are reduced.  Able to name objects and repeat phrases.  Hearing is intact to conversational tone.    Sensation: Sensation is intact to light touch throughout Motor: Strength is at least antigravity x4. Tremors: none  DTR's 2/4 in UE/LE     Movement examination: Tone: There is normal tone in the UE/LE Abnormal movements:  no tremor.  No myoclonus.  No asterixis.   Coordination:  There is no decremation with RAM's. Normal finger to nose  Gait and Station: The patient has no difficulty arising out of a deep-seated chair without the use of the hands. The patient's stride length is good.  Gait is cautious and narrow.     Total time spent on today's visit was 30  minutes, including both face-to-face time and nonface-to-face time. Time included that spent on review of records (prior notes available to me/labs/imaging if pertinent), discussing treatment and goals, answering patient's questions and coordinating care.  Cc:  Rankins, Bill Salinas, MD Sharene Butters, PA-C

## 2022-02-02 ENCOUNTER — Other Ambulatory Visit: Payer: Self-pay

## 2022-02-02 ENCOUNTER — Ambulatory Visit: Payer: Medicare HMO | Admitting: Licensed Clinical Social Worker

## 2022-02-02 DIAGNOSIS — R413 Other amnesia: Secondary | ICD-10-CM

## 2022-02-02 NOTE — Progress Notes (Signed)
Integrated Behavioral Health Initial In-Person Visit ? ?MRN: [id number] ?Name: Doris Wyatt ? ?Number of [place] Clinician visits: No data recorded ?Session Start time: No data recorded   ?Session End time: No data recorded ?Total time in minutes: No data recorded ? ?Types of Service: [place] (BHI) ? ?Interpretor:No. Interpretor Name and Language: NA ? ? Warm Hand Off Completed. ?  ? ?  ? ? ?Subjective: ?AVERLY Doris Wyatt is a 76 y.o. female accompanied by Guardian CHILD Pty's son ?Patient was referred by Sharene Butters for Resource information . ?Patient reports the following symptoms/concerns: Caregiver strain and burden and resources needed in the home  ?Duration of problem: Several months ; Severity of problem: mild ? ?Objective: ?Mood:  Appropriate   and Affect: Appropriate ?Risk of harm to self or others: No plan to harm self or others ? ?Life Context: ?Family and Social: Pt resides by herself and needs resources in the community to assist with care to reduce strain and stress of caregiver ?School/Work: Pt is retired and lives in apartment  ?Self-Care: Pt is able to complete most ADL's but does need assistance with some and does not drive ?Life Changes: Pt does have cognitive decline noted  ? ?Patient and/or Family's Strengths/Protective Factors: ?Concrete supports in place (healthy food, safe environments, etc.) ? ?Goals Addressed: ?Patient will: ?Reduce symptoms of: stress and Caregiver Burden ?Increase knowledge and/or ability of: coping skills, stress reduction, and Community Resources    ?Demonstrate ability to: Increase healthy adjustment to current life circumstances ? ?Progress towards Goals: ?Ongoing ? ?Interventions: ?Interventions utilized: Link to [employer]  ?Standardized Assessments completed: Not Needed ? ?Patient and/or Family Response: Pt/caregiver open to education on safety for Pt and resources in the community and self care and  coping skills for caregiver   ? ?Patient Centered Plan: ?Patient is on the following Treatment Plan(s):  Increase safety in the home  ? ?Assessment: ?Patient currently experiencing Loss of cognitive skills more forgetful and increase caregiver stress burden with caregiver . ?  ?Patient may benefit from Adult day center, Personal care in the home Senior resources , Med planner bubble pack/alarm for meds. . ? ?Plan: ?Follow up with behavioral health clinician on : As needed  ?Behavioral recommendations: Follow up with community resources  ?Referral(s): [employer] Resources:  [facility] [place] , [facility] [place], Others in the [place] , resources for seniors  ?"From scale of 1-10, how likely are you to follow plan?": 9 ? ?Arda Keadle A Taylor-Paladino, LCSW ? ? ? ? ? ? ? ? ?

## 2022-02-02 NOTE — Patient Instructions (Signed)
Adult Day Center, [facility] , [facility] [place], Others in the [place] , resources for seniors  ?

## 2022-02-25 DIAGNOSIS — R269 Unspecified abnormalities of gait and mobility: Secondary | ICD-10-CM | POA: Diagnosis not present

## 2022-02-25 DIAGNOSIS — R5381 Other malaise: Secondary | ICD-10-CM | POA: Diagnosis not present

## 2022-03-06 DIAGNOSIS — R5381 Other malaise: Secondary | ICD-10-CM | POA: Diagnosis not present

## 2022-03-06 DIAGNOSIS — R269 Unspecified abnormalities of gait and mobility: Secondary | ICD-10-CM | POA: Diagnosis not present

## 2022-04-28 DIAGNOSIS — Z1389 Encounter for screening for other disorder: Secondary | ICD-10-CM | POA: Diagnosis not present

## 2022-04-28 DIAGNOSIS — Z0000 Encounter for general adult medical examination without abnormal findings: Secondary | ICD-10-CM | POA: Diagnosis not present

## 2022-06-03 DIAGNOSIS — Z79899 Other long term (current) drug therapy: Secondary | ICD-10-CM | POA: Diagnosis not present

## 2022-06-03 DIAGNOSIS — E7800 Pure hypercholesterolemia, unspecified: Secondary | ICD-10-CM | POA: Diagnosis not present

## 2022-06-03 DIAGNOSIS — I10 Essential (primary) hypertension: Secondary | ICD-10-CM | POA: Diagnosis not present

## 2022-06-03 DIAGNOSIS — E1169 Type 2 diabetes mellitus with other specified complication: Secondary | ICD-10-CM | POA: Diagnosis not present

## 2022-06-03 DIAGNOSIS — Z7984 Long term (current) use of oral hypoglycemic drugs: Secondary | ICD-10-CM | POA: Diagnosis not present

## 2022-06-03 DIAGNOSIS — J449 Chronic obstructive pulmonary disease, unspecified: Secondary | ICD-10-CM | POA: Diagnosis not present

## 2022-06-03 DIAGNOSIS — G309 Alzheimer's disease, unspecified: Secondary | ICD-10-CM | POA: Diagnosis not present

## 2022-06-03 DIAGNOSIS — E538 Deficiency of other specified B group vitamins: Secondary | ICD-10-CM | POA: Diagnosis not present

## 2022-08-04 ENCOUNTER — Encounter: Payer: Self-pay | Admitting: Physician Assistant

## 2022-08-04 ENCOUNTER — Ambulatory Visit: Payer: Medicare HMO | Admitting: Physician Assistant

## 2022-08-04 VITALS — BP 159/79 | HR 82 | Resp 18 | Wt 106.0 lb

## 2022-08-04 DIAGNOSIS — E119 Type 2 diabetes mellitus without complications: Secondary | ICD-10-CM | POA: Diagnosis not present

## 2022-08-04 DIAGNOSIS — G309 Alzheimer's disease, unspecified: Secondary | ICD-10-CM

## 2022-08-04 DIAGNOSIS — F0280 Dementia in other diseases classified elsewhere without behavioral disturbance: Secondary | ICD-10-CM | POA: Diagnosis not present

## 2022-08-04 MED ORDER — MEMANTINE HCL 5 MG PO TABS: ORAL_TABLET | ORAL | 11 refills | Status: DC

## 2022-08-04 NOTE — Patient Instructions (Addendum)
It was a pleasure to see you today at our office.   Recommendations:  Follow up in  6 months Continue donepezil 10 mg daily. Side effects were discussed  Start Memantine 5 mg, take 1 tab at night then after 2 weeks increase it to twice daily  Try to reduce the caffeine  Whom to call:  Memory  decline, memory medications: Call our office [phone]   For psychiatric meds, mood meds: Please have your primary care physician manage these medications.   Counseling regarding caregiver distress, including caregiver depression, anxiety and issues regarding community resources, adult day care programs, adult living facilities, or memory care questions:   Feel free to contact [place], Social Worker at [phone]   For assessment of decision of mental capacity and competency:  Call Dr. Anthoney Harada, geriatric psychiatrist at [phone]  For guidance in geriatric dementia issues please call [facility] [phone]  For guidance regarding [facility] and if placement were needed at the facility, contact Arnell Asal, Social Worker tel: [phone]  If you have any severe symptoms of a stroke, or other severe issues such as confusion,severe chills or fever, etc call 911 or go to the ER as you may need to be evaluated further   Feel free to visit Facebook page " [facility]" for tips of how to care for people with memory problems.         RECOMMENDATIONS FOR ALL PATIENTS WITH MEMORY PROBLEMS: 1. Continue to exercise (Recommend 30 minutes of walking everyday, or 3 hours every week) 2. Increase social interactions - continue going to [place] and enjoy social gatherings with friends and family 3. Eat healthy, avoid fried foods and eat more fruits and vegetables 4. Maintain adequate blood pressure, blood sugar, and blood cholesterol level. Reducing the risk of stroke and cardiovascular disease also helps promoting better memory. 5. Avoid stressful  situations. Live a simple life and avoid aggravations. Organize your time and prepare for the next day in anticipation. 6. Sleep well, avoid any interruptions of sleep and avoid any distractions in the bedroom that may interfere with adequate sleep quality 7. Avoid sugar, avoid sweets as there is a strong link between excessive sugar intake, diabetes, and cognitive impairment We discussed the Mediterranean diet, which has been shown to help patients reduce the risk of progressive memory disorders and reduces cardiovascular risk. This includes eating fish, eat fruits and green leafy vegetables, nuts like almonds and hazelnuts, walnuts, and also use olive oil. Avoid fast foods and fried foods as much as possible. Avoid sweets and sugar as sugar use has been linked to worsening of memory function.  There is always a concern of gradual progression of memory problems. If this is the case, then we may need to adjust level of care according to patient needs. Support, both to the patient and caregiver, should then be put into place.    The Alzheimer's Association is here all day, every day for people facing Alzheimer's disease through our free 24/7 Helpline: [phone]. The Helpline provides reliable information and support to all those who need assistance, such as individuals living with memory loss, Alzheimer's or other dementia, caregivers, health care professionals and the public.  Our highly trained and knowledgeable staff can help you with: Understanding memory loss, dementia and Alzheimer's  Medications and other treatment options  General information about aging and brain health  Skills to provide quality care and to find the best care from professionals  Legal, financial and  living-arrangement decisions Our Helpline also features: Confidential care consultation provided by master's level clinicians who can help with decision-making support, crisis assistance and education on issues families face  every day  Help in a caller's preferred language using our translation service that features more than 200 languages and dialects  Referrals to local community programs, services and ongoing support     FALL PRECAUTIONS: Be cautious when walking. Scan the area for obstacles that may increase the risk of trips and falls. When getting up in the mornings, sit up at the edge of the bed for a few minutes before getting out of bed. Consider elevating the bed at the head end to avoid drop of blood pressure when getting up. Walk always in a well-lit room (use night lights in the walls). Avoid area rugs or power cords from appliances in the middle of the walkways. Use a walker or a cane if necessary and consider physical therapy for balance exercise. Get your eyesight checked regularly.  FINANCIAL OVERSIGHT: Supervision, especially oversight when making financial decisions or transactions is also recommended.  HOME SAFETY: Consider the safety of the kitchen when operating appliances like stoves, microwave oven, and blender. Consider having supervision and share cooking responsibilities until no longer able to participate in those. Accidents with firearms and other hazards in the house should be identified and addressed as well.   ABILITY TO BE LEFT ALONE: If patient is unable to contact 911 operator, consider using LifeLine, or when the need is there, arrange for someone to stay with patients. Smoking is a fire hazard, consider supervision or cessation. Risk of wandering should be assessed by caregiver and if detected at any point, supervision and safe proof recommendations should be instituted.  MEDICATION SUPERVISION: Inability to self-administer medication needs to be constantly addressed. Implement a mechanism to ensure safe administration of the medications.   DRIVING: Regarding driving, in patients with progressive memory problems, driving will be impaired. We advise to have someone else do the  driving if trouble finding directions or if minor accidents are reported. Independent driving assessment is available to determine safety of driving.   If you are interested in the driving assessment, you can contact the following:  [employer] in [place]  [place] [phone] or [phone]      [place] refers to food and lifestyle choices that are based on the traditions of countries located on the [employer]. This way of eating has been shown to help prevent certain conditions and improve outcomes for people who have chronic diseases, like kidney disease and heart disease. What are tips for following this plan? Lifestyle  Cook and eat meals together with your family, when possible. Drink enough fluid to keep your urine clear or pale yellow. Be physically active every day. This includes: Aerobic exercise like running or swimming. Leisure activities like gardening, walking, or housework. Get 7-8 hours of sleep each night. If recommended by your health care provider, drink red wine in moderation. This means 1 glass a day for nonpregnant women and 2 glasses a day for men. A glass of wine equals 5 oz (150 mL). Reading food labels  Check the serving size of packaged foods. For foods such as rice and pasta, the serving size refers to the amount of cooked product, not dry. Check the total fat in packaged foods. Avoid foods that have saturated fat or trans fats. Check the  ingredients list for added sugars, such as corn syrup. Shopping  At the grocery store, buy most of your food from the areas near the walls of the store. This includes: Fresh fruits and vegetables (produce). Grains, beans, nuts, and seeds. Some of these may be available in unpackaged forms or large amounts (in bulk). Fresh seafood. Poultry and eggs. Low-fat dairy  products. Buy whole ingredients instead of prepackaged foods. Buy fresh fruits and vegetables in-season from local farmers markets. Buy frozen fruits and vegetables in resealable bags. If you do not have access to quality fresh seafood, buy precooked frozen shrimp or canned fish, such as tuna, salmon, or sardines. Buy small amounts of raw or cooked vegetables, salads, or olives from the deli or salad bar at your store. Stock your pantry so you always have certain foods on hand, such as olive oil, canned tuna, canned tomatoes, rice, pasta, and beans. Cooking  Cook foods with extra-virgin olive oil instead of using butter or other vegetable oils. Have meat as a side dish, and have vegetables or grains as your main dish. This means having meat in small portions or adding small amounts of meat to foods like pasta or stew. Use beans or vegetables instead of meat in common dishes like chili or lasagna. Experiment with different cooking methods. Try roasting or broiling vegetables instead of steaming or sauteing them. Add frozen vegetables to soups, stews, pasta, or rice. Add nuts or seeds for added healthy fat at each meal. You can add these to yogurt, salads, or vegetable dishes. Marinate fish or vegetables using olive oil, lemon juice, garlic, and fresh herbs. Meal planning  Plan to eat 1 vegetarian meal one day each week. Try to work up to 2 vegetarian meals, if possible. Eat seafood 2 or more times a week. Have healthy snacks readily available, such as: Vegetable sticks with hummus. Greek yogurt. Fruit and nut trail mix. Eat balanced meals throughout the week. This includes: Fruit: 2-3 servings a day Vegetables: 4-5 servings a day Low-fat dairy: 2 servings a day Fish, poultry, or lean meat: 1 serving a day Beans and legumes: 2 or more servings a week Nuts and seeds: 1-2 servings a day Whole grains: 6-8 servings a day Extra-virgin olive oil: 3-4 servings a day Limit red meat and sweets  to only a few servings a month What are my food choices? Mediterranean diet Recommended Grains: Whole-grain pasta. Brown rice. Bulgar wheat. Polenta. Couscous. Whole-wheat bread. [place]. Vegetables: Artichokes. Beets. Broccoli. Cabbage. Carrots. Eggplant. Green beans. Chard. Kale. Spinach. Onions. Leeks. Peas. Squash. Tomatoes. Peppers. Radishes. Fruits: Apples. Apricots. Avocado. Berries. Bananas. Cherries. Dates. Figs. Grapes. Lemons. Melon. Oranges. Peaches. Plums. Pomegranate. Meats and other protein foods: Beans. Almonds. Sunflower seeds. Pine nuts. Peanuts. Bell. Salmon. Scallops. Shrimp. [place]. Tilapia. Clams. Oysters. Eggs. Dairy: Low-fat milk. Cheese. Greek yogurt. Beverages: Water. Red wine. Herbal tea. Fats and oils: Extra virgin olive oil. Avocado oil. Grape seed oil. Sweets and desserts: [place] yogurt with honey. Baked apples. Poached pears. Trail mix. Seasoning and other foods: Basil. Cilantro. Coriander. Cumin. Mint. Parsley. Sage. Rosemary. Tarragon. Garlic. Oregano. Thyme. Pepper. Balsalmic vinegar. Tahini. Hummus. Tomato sauce. Olives. Mushrooms. Limit these Grains: Prepackaged pasta or rice dishes. Prepackaged cereal with added sugar. Vegetables: Deep fried potatoes (french fries). Fruits: Fruit canned in syrup. Meats and other protein foods: Beef. Pork. Lamb. Poultry with skin. Hot dogs. [place] Salines. Dairy: Ice cream. Sour cream. Whole milk. Beverages: Juice. Sugar-sweetened soft drinks. Beer. Liquor and spirits. Fats and oils: Butter. Canola  oil. Vegetable oil. Beef fat (tallow). Lard. Sweets and desserts: Cookies. Cakes. Pies. Candy. Seasoning and other foods: Mayonnaise. Premade sauces and marinades. The items listed may not be a complete list. Talk with your dietitian about what dietary choices are right for you. Summary The Mediterranean diet includes both food and lifestyle choices. Eat a variety of fresh fruits and vegetables, beans, nuts, seeds, and whole  grains. Limit the amount of red meat and sweets that you eat. Talk with your health care provider about whether it is safe for you to drink red wine in moderation. This means 1 glass a day for nonpregnant women and 2 glasses a day for men. A glass of wine equals 5 oz (150 mL). This information is not intended to replace advice given to you by your health care provider. Make sure you discuss any questions you have with your health care provider. Document Released: 07/09/2016 Document Revised: 08/11/2016 Document Reviewed: 07/09/2016 Elsevier Interactive Patient Education  2017 [employer].

## 2022-08-04 NOTE — Progress Notes (Signed)
Assessment/Plan:   Dementia due to Alzheimer's Disease with behavioral disturbance  Doris Wyatt is a very pleasant 76 y.o. RH female with a history of hypertension, hyperlipidemia, DM2, history of head injury 2016, COPD/asthma anxiety, anxiety, depression and a history of dementia due to Alzheimer's disease.  She is seen today in follow up for memory loss. Priorly reviewed MRI of the brain 08/07/2021 was remarkable for moderate chronic small vessel ischemic  changes of the cerebral hemispheric white matter.  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  No acute changes were seen. Patient is currently on donepezil 10 mg daily, tolerating well.  She is also on Depakote 125 mg twice daily for mood stabilization, tolerating well.    Follow up in 6 months. Continue donepezil 10 mg daily.  Side effects discussed Start memantine 5 mg, take 1 tab p.o. nightly for 2 weeks, then increase to 1 p.o. twice daily if tolerated.  Side effects discussed Continue Depakote 125 mg twice daily for hallucinations and mood.  Side effects discussed.    Subjective:    This patient is accompanied in the office by  who supplements the history.  Previous records as well as any outside records available were reviewed prior to todays visit.    Any changes in memory since last visit? "A little worse"-her son reports.  She has more difficulty with short-term memory than prior.  Long-term memory is good. Patient lives with: She lives alone, with her children monitoring. repeats oneself?  Endorsed Disoriented when walking into a room?  Patient denies   Leaving objects in unusual places?  Patient denies   Ambulates  with difficulty?   Patient denies   Recent falls?  Patient denies   Any head injuries?  Patient denies   History of seizures?   Patient denies   Wandering behavior?  Patient denies   Patient drives?   Patient no longer drives  Any  mood changes since last visit?  Patient denies .  "Her mood is better since she has increased her Depakote to twice a day ". Any worsening depression?:  Patient denies   Hallucinations?  "At times people come in the house "-but is minimal  Paranoia? Denied  Patient reports that sleeps well without vivid dreams, REM behavior or sleepwalking   History of sleep apnea?  Patient denies   Any hygiene concerns?  Endorsed by the family.  "Sometimes it is hard to make her take a shower ".  She has been showing some hoarding behavior. Independent of bathing and dressing?  Endorsed  Does the patient needs help with medications?  Denies, but son reports that she may skip a medication "although sometimes it to me who forgets to give it to her "-son says Who is in charge of the finances?  Son  is in charge    Any changes in appetite?  Patient denies.  Her son make sure that he gives her decaffeinated coffee.  However she continues to drink some [facility]. Patient have trouble swallowing? Patient denies   Does the patient cook?  Patient denies   Any kitchen accidents such as leaving the stove on? Patient denies   Any headaches?  Patient denies   Double vision? Patient denies   Any focal numbness or tingling?  Patient denies   Chronic back pain Patient denies   Unilateral weakness?  Patient denies   Any tremors?  Patient denies   Any  history of anosmia?  Patient denies   Any incontinence of urine?  Patient denies   Any bowel dysfunction?   Patient denies.  "She only gets diarrhea when she drinks a lot of coffee "     Initial evaluation 07/21/2021 the patient is seen in neurologic consultation at the request of Rankins, Bill Salinas, MD for the evaluation of memory.  The patient is accompanied by her daughter  who supplements the history. She is a 76 y.o. year old female who has had memory issues for about 4 years, when she began retelling stories, and asking the same questions.  In Thanksgiving 2020, the  patient was repetitively asking for cornstarch to her daughter, who at that point began to be worried that there was "something more to it ".  About 1 year ago, the patient began to forget to take her medications, or remember which medication she was taking.  Her daughter has to have someone coming in monitoring this meds.  She initially felt that memory loss was related to anxiety and nervousness with testing, and that she was " never good spelling or math, having not had finished high school". The patient lives alone, and her children monitor her on a regular basis, as they live nearby.  She likes coloring books, and she dislikes crossword puzzles.  She likes to watch TV and socialize frequently.  Her mood has been always anxious, but there is no apparent depression.  She has been irritable at times especially when she cannot remember.  She reports sleeping well, but recently, she is not sure that she was having vivid dreams, or hallucinations.  She was on Seroquel, and she attributes these changes to the medicine, which are now less frequent, and these hallucinations have resolved.  They were described as "frames coming off of the wall, last episode about 1 week ago ".  She is also positive of her pocketbook, but denies paranoia, although her daughter "is not that sure ", most recently, she accused her of hiding $100 from the picture frames.  She is independent of bathing and dressing, that he had she has been showing hoarding behavior.  Her appetite is good, but has become more picky about the choices of food.  She denies trouble swallowing.  She cooks and denies leaving the stove on.  Ambulates without difficulty without walker or cane.  She had a recent mechanical fall due to being tangled with a dog leash.  No fractures or head injury.  She has not been driving, as she lost interest in doing so.  She denies any headaches, double vision, dizziness, focal numbness or tingling, unilateral weakness or tremors.   She denies incontinence or retention, or constipation.  She likes to drink excessive amount of coffee, which may contribute to some diarrhea.  She denies anosmia.  No history of OSA, alcohol.  She quit in 1991 the use of 3 pack a day of tobacco.  Family history for maternal grandmother with dementia.      Neurocognitive Test 07/09/21 Dr. Melvyn Novas " The results of cognitive testing is dementia due to Alzheimer's disease. Ms. Dina had notable trouble learning new information, even with additional learning trials, and exhibited a flat learning curve across assessments. She was also fully amnestic (i.e., retention percentages of 0%) across all administered memory tasks, suggesting an ongoing and notable impairment storing newly learned information. This pattern of memory dysfunction is the hallmark characteristic of this condition. Additional dysfunction surrounding confrontation naming, semantic fluency, and visuospatial  abilities are further consistent with Alzheimer's disease. Taken together, she does exhibit a fairly classic pattern.    PREVIOUS MEDICATIONS:   CURRENT MEDICATIONS:  Outpatient Encounter Medications as of 08/04/2022  Medication Sig   alendronate (FOSAMAX) 70 MG tablet Take 70 mg by mouth every 7 (seven) days. Take with a full glass of water on an empty stomach. Takes on Tuesday.   aspirin 325 MG tablet Take 325 mg by mouth daily.   atorvastatin (LIPITOR) 40 MG tablet Take 40 mg by mouth every evening.    divalproex (DEPAKOTE) 125 MG DR tablet Take 1 tablet (125 mg total) by mouth 2 (two) times daily.   donepezil (ARICEPT) 10 MG tablet Take 1 tablet  at night   ferrous sulfate 324 MG TBEC Take 324 mg by mouth.   glimepiride (AMARYL) 2 MG tablet Take 2 mg by mouth daily.   metFORMIN (GLUCOPHAGE-XR) 500 MG 24 hr tablet    valsartan (DIOVAN) 80 MG tablet Take 80 mg by mouth daily.   [DISCONTINUED] memantine (NAMENDA) 5 MG tablet Take 1 tablet (5 mg at night) for 2 weeks, then increase to 1  tablet (5 mg) twice a day   acetaminophen (TYLENOL) 325 MG tablet Take 2 tablets (650 mg total) by mouth every 6 (six) hours as needed for mild pain (or Fever >/= 101). (Patient not taking: Reported on 07/21/2021)   ALPRAZolam (XANAX) 0.25 MG tablet Take 0.25 mg by mouth at bedtime as needed for anxiety. (Patient not taking: Reported on 07/21/2021)   Fluticasone-Salmeterol (ADVAIR) 100-50 MCG/DOSE AEPB Inhale 1 puff 2 (two) times daily into the lungs. (Patient not taking: Reported on 07/21/2021)   HYDROcodone-acetaminophen (NORCO/VICODIN) 5-325 MG tablet Take 1 tablet every 6 (six) hours by mouth. (Patient not taking: Reported on 07/21/2021)   lactobacillus (FLORANEX/LACTINEX) PACK Take 1 packet (1 g total) by mouth 3 (three) times daily with meals. (Patient not taking: Reported on 07/21/2021)   memantine (NAMENDA) 5 MG tablet Take 1 tablet (5 mg at night) for 2 weeks, then increase to 1 tablet (5 mg) twice a day   Menthol-Zinc Oxide (GOLD BOND EX) Apply 1 application topically daily as needed (for feet and hands). (Patient not taking: Reported on 07/21/2021)   Multiple Vitamin (MULITIVITAMIN WITH MINERALS) TABS Take 1 tablet by mouth daily. (Patient not taking: Reported on 07/21/2021)   ondansetron (ZOFRAN ODT) 4 MG disintegrating tablet Take 1 tablet (4 mg total) every 8 (eight) hours as needed by mouth for nausea or vomiting. (Patient not taking: No sig reported)   ondansetron (ZOFRAN ODT) 4 MG disintegrating tablet Take 1 tablet (4 mg total) by mouth every 8 (eight) hours as needed for nausea or vomiting. (Patient not taking: No sig reported)   oxybutynin (DITROPAN) 5 MG tablet Take 1 tablet (5 mg total) by mouth 3 (three) times daily. (Patient not taking: No sig reported)   oxyCODONE-acetaminophen (PERCOCET/ROXICET) 5-325 MG tablet Take 1 tablet by mouth every 6 (six) hours as needed for severe pain. (Patient not taking: No sig reported)   Polyethyl Glycol-Propyl Glycol (SYSTANE OP) Place 2 drops into  both eyes daily as needed (DRY EYES). (Patient not taking: Reported on 07/21/2021)   polyethylene glycol (MIRALAX / GLYCOLAX) packet Take 17 g by mouth daily. (Patient not taking: Reported on 07/21/2021)   No facility-administered encounter medications on file as of 08/04/2022.        08/05/2022    7:00 AM  MMSE - Mini Mental State Exam  Orientation to time 1  Orientation to Place 5  Registration 3  Attention/ Calculation 4  Recall 0  Language- name 2 objects 2  Language- repeat 1  Language- follow 3 step command 3  Language- read & follow direction 1  Write a sentence 1  Copy design 1  Total score 22       No data to display          Objective:     PHYSICAL EXAMINATION:    VITALS:   Vitals:   08/04/22 1453  BP: (!) 159/79  Pulse: 82  Resp: 18  SpO2: 96%  Weight: 106 lb (48.1 kg)    GEN:  The patient appears stated age and is in NAD. HEENT:  Normocephalic, atraumatic.   Neurological examination:  General: NAD, well-groomed, appears stated age. Orientation: The patient is alert. Oriented to person, place, not to date, the year is 1980.  Weight at weight at a is Cranial nerves: There is good facial symmetry.The speech is fluent and clear. No aphasia or dysarthria. Fund of knowledge is appropriate. Recent and remote memory are impaired. Attention and concentration are reduced.  Able to name objects and repeat phrases.  Hearing is intact to conversational tone.    Sensation: Sensation is intact to light touch throughout Motor: Strength is at least antigravity x4. Tremors: none  DTR's 2/4 in UE/LE     Movement examination: Tone: There is normal tone in the UE/LE Abnormal movements:  no tremor.  No myoclonus.  No asterixis.   Coordination:  There is no decremation with RAM's. Normal finger to nose  Gait and Station: The patient has no difficulty arising out of a deep-seated chair without the use of the hands. The patient's stride length is good.  Gait is cautious  and narrow.    Thank you for allowing [place] the opportunity to participate in the care of this nice patient. Please do not hesitate to contact us for any questions or concerns.   Total time spent on today's visit was 25 minutes dedicated to this patient today, preparing to see patient, examining the patient, ordering tests and/or medications and counseling the patient, documenting clinical information in the EHR or other health record, independently interpreting results and communicating results to the patient/family, discussing treatment and goals, answering patient's questions and coordinating care.  Cc:  Rankins, Bill Salinas, MD  Sharene Butters 08/05/2022 7:20 AM

## 2022-11-04 DIAGNOSIS — S81802A Unspecified open wound, left lower leg, initial encounter: Secondary | ICD-10-CM | POA: Diagnosis not present

## 2022-11-04 DIAGNOSIS — T148XXA Other injury of unspecified body region, initial encounter: Secondary | ICD-10-CM | POA: Diagnosis not present

## 2022-11-04 DIAGNOSIS — E1165 Type 2 diabetes mellitus with hyperglycemia: Secondary | ICD-10-CM | POA: Diagnosis not present

## 2022-12-10 DIAGNOSIS — Z79899 Other long term (current) drug therapy: Secondary | ICD-10-CM | POA: Diagnosis not present

## 2022-12-10 DIAGNOSIS — J449 Chronic obstructive pulmonary disease, unspecified: Secondary | ICD-10-CM | POA: Diagnosis not present

## 2022-12-10 DIAGNOSIS — E7800 Pure hypercholesterolemia, unspecified: Secondary | ICD-10-CM | POA: Diagnosis not present

## 2022-12-10 DIAGNOSIS — I10 Essential (primary) hypertension: Secondary | ICD-10-CM | POA: Diagnosis not present

## 2022-12-10 DIAGNOSIS — G309 Alzheimer's disease, unspecified: Secondary | ICD-10-CM | POA: Diagnosis not present

## 2022-12-10 DIAGNOSIS — R636 Underweight: Secondary | ICD-10-CM | POA: Diagnosis not present

## 2022-12-10 DIAGNOSIS — Z23 Encounter for immunization: Secondary | ICD-10-CM | POA: Diagnosis not present

## 2022-12-10 DIAGNOSIS — E1165 Type 2 diabetes mellitus with hyperglycemia: Secondary | ICD-10-CM | POA: Diagnosis not present

## 2022-12-14 ENCOUNTER — Other Ambulatory Visit: Payer: Self-pay | Admitting: Physician Assistant

## 2023-02-02 ENCOUNTER — Encounter: Payer: Self-pay | Admitting: Physician Assistant

## 2023-02-02 ENCOUNTER — Ambulatory Visit (INDEPENDENT_AMBULATORY_CARE_PROVIDER_SITE_OTHER): Payer: Medicare HMO | Admitting: Physician Assistant

## 2023-02-02 VITALS — BP 147/72 | HR 85 | Resp 18 | Ht 66.0 in | Wt 104.0 lb

## 2023-02-02 DIAGNOSIS — G309 Alzheimer's disease, unspecified: Secondary | ICD-10-CM | POA: Diagnosis not present

## 2023-02-02 DIAGNOSIS — E119 Type 2 diabetes mellitus without complications: Secondary | ICD-10-CM | POA: Diagnosis not present

## 2023-02-02 DIAGNOSIS — F0280 Dementia in other diseases classified elsewhere without behavioral disturbance: Secondary | ICD-10-CM | POA: Diagnosis not present

## 2023-02-02 DIAGNOSIS — F03918 Unspecified dementia, unspecified severity, with other behavioral disturbance: Secondary | ICD-10-CM | POA: Diagnosis not present

## 2023-02-02 MED ORDER — DONEPEZIL HCL 10 MG PO TABS: ORAL_TABLET | ORAL | 2 refills | Status: DC

## 2023-02-02 MED ORDER — MEMANTINE HCL 10 MG PO TABS: ORAL_TABLET | ORAL | 11 refills | Status: DC

## 2023-02-02 MED ORDER — DIVALPROEX SODIUM 125 MG PO DR TAB: 125.0000 mg | DELAYED_RELEASE_TABLET | Freq: Two times a day (BID) | ORAL | 11 refills | Status: DC

## 2023-02-02 NOTE — Progress Notes (Signed)
Assessment/Plan:   Dementia likely due to Alzheimer's Disease with behavioral disturbance  Doris Wyatt is a very pleasant 77 y.o. RH female with a history of hypertension, hyperlipidemia, DM2, history of head injury 2016, COPD/asthma anxiety, anxiety, depression and a history of dementia likely due to Alzheimer's disease with behavioral disturbance seen today in follow up for memory loss. Patient is currently on donepezil 10 mg daily and memantine 5 mg bid,  and Depakote 125 mg bid for mood . Priorly reviewed MRI of the brain 08/07/2021 was remarkable for moderate chronic small vessel ischemic  changes of the cerebral hemispheric white matter.  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  No acute changes were seen. Today's MMSE is 16/30, worse from prior (22/30 on 08/05/22).         Follow up in 6  months. Continue donepezil 10 mg daily. Side effects were discussed  Continue Memantine 10 mg twice daily. Side effects were discussed  Recommend good control of cardiovascular risk factors.   Continue Depakote 125 mg bid for mood, side effects discussed    Subjective:    This patient is accompanied in the office by her son who supplements the history.  Previous records as well as any outside records available were reviewed prior to todays visit. Patient was last seen on 08/24/22 at which time her MMSE was 22/30.     Any changes in memory since last visit? Her family reports that her memory may be worse, especially STM, but LTM is affected as well. .  repeats oneself? Endorsed  Disoriented when walking into a room?  Patient denies   Leaving objects in unusual places?    denies   Wandering behavior?  denies   Any personality changes since last visit?  denies   Any worsening depression?:  denies   Hallucinations or paranoia?  Denies  Seizures?    denies    Any sleep changes? Sometimes she has vivid dreams, may talk in  her sleep , REM behavior or sleepwalking   Sleep apnea?   denies   Any hygiene concerns? Daughter reports that she has to be reminded , because she is always cold.  Independent of bathing and dressing?  Endorsed  Does the patient needs help with medications?  Son is in charge   Who is in charge of the finances? Son  is in charge     Any changes in appetite? Eats when with her children, otherwise "it can be a couple of days  that she may not eat if we don't watch her"     Patient have trouble swallowing?  denies   Does the patient cook?  Any kitchen accidents such as leaving the stove on? Patient denies   Any headaches?   denies   Chronic back pain  denies   Ambulates with difficulty?     denies   Recent falls or head injuries? denies     Unilateral weakness, numbness or tingling?    denies   Any tremors?  denies   Any anosmia?  Patient denies   Any incontinence of urine?  denies   Any bowel dysfunction?  When she drinks coffee she may have diarrhea, she drinks decaf now, and some tea".  Patient lives " alone no more than 48 hrs" Does the patient drive? No longer drives   Initial evaluation 07/21/2021 the patient is seen in neurologic consultation at the request of Rankins,  Doris Salinas, MD for the evaluation of memory.  The patient is accompanied by her daughter  who supplements the history. She is a 77 y.o. year old female who has had memory issues for about 4 years, when she began retelling stories, and asking the same questions.  In Thanksgiving 2020, the patient was repetitively asking for cornstarch to her daughter, who at that point began to be worried that there was "something more to it ".  About 1 year ago, the patient began to forget to take her medications, or remember which medication she was taking.  Her daughter has to have someone coming in monitoring this meds.  She initially felt that memory loss was related to anxiety and nervousness with testing, and that she was " never good  spelling or math, having not had finished high school". The patient lives alone, and her children monitor her on a regular basis, as they live nearby.  She likes coloring books, and she dislikes crossword puzzles.  She likes to watch TV and socialize frequently.  Her mood has been always anxious, but there is no apparent depression.  She has been irritable at times especially when she cannot remember.  She reports sleeping well, but recently, she is not sure that she was having vivid dreams, or hallucinations.  She was on Seroquel, and she attributes these changes to the medicine, which are now less frequent, and these hallucinations have resolved.  They were described as "frames coming off of the wall, last episode about 1 week ago ".  She is also positive of her pocketbook, but denies paranoia, although her daughter "is not that sure ", most recently, she accused her of hiding $100 from the picture frames.  She is independent of bathing and dressing, that he had she has been showing hoarding behavior.  Her appetite is good, but has become more picky about the choices of food.  She denies trouble swallowing.  She cooks and denies leaving the stove on.  Ambulates without difficulty without walker or cane.  She had a recent mechanical fall due to being tangled with a dog leash.  No fractures or head injury.  She has not been driving, as she lost interest in doing so.  She denies any headaches, double vision, dizziness, focal numbness or tingling, unilateral weakness or tremors.  She denies incontinence or retention, or constipation.  She likes to drink excessive amount of coffee, which may contribute to some diarrhea.  She denies anosmia.  No history of OSA, alcohol.  She quit in 1991 the use of 3 pack a day of tobacco.  Family history for maternal grandmother with dementia.      Neurocognitive Test 07/09/21 Dr. Melvyn Novas " The results of cognitive testing is dementia due to Alzheimer's disease. [name] had  notable trouble learning new information, even with additional learning trials, and exhibited a flat learning curve across assessments. She was also fully amnestic (i.e., retention percentages of 0%) across all administered memory tasks, suggesting an ongoing and notable impairment storing newly learned information. This pattern of memory dysfunction is the hallmark characteristic of this condition. Additional dysfunction surrounding confrontation naming, semantic fluency, and visuospatial abilities are further consistent with Alzheimer's disease. Taken together, she does exhibit a fairly classic pattern.  PREVIOUS MEDICATIONS:   CURRENT MEDICATIONS:  Outpatient Encounter Medications as of 02/02/2023  Medication Sig   alendronate (FOSAMAX) 70 MG tablet Take 70 mg by mouth every 7 (seven) days. Take with a full glass of water  on an empty stomach. Takes on Tuesday.   aspirin 325 MG tablet Take 325 mg by mouth daily.   atorvastatin (LIPITOR) 40 MG tablet Take 40 mg by mouth every evening.    ferrous sulfate 324 MG TBEC Take 324 mg by mouth.   glimepiride (AMARYL) 2 MG tablet Take 2 mg by mouth daily.   metFORMIN (GLUCOPHAGE-XR) 500 MG 24 hr tablet    Multiple Vitamin (MULITIVITAMIN WITH MINERALS) TABS Take 1 tablet by mouth daily.   valsartan (DIOVAN) 80 MG tablet Take 80 mg by mouth daily.   [DISCONTINUED] divalproex (DEPAKOTE) 125 MG DR tablet Take 1 tablet (125 mg total) by mouth 2 (two) times daily.   [DISCONTINUED] donepezil (ARICEPT) 10 MG tablet TAKE 1 TABLET BY MOUTH AT NIGHT   [DISCONTINUED] memantine (NAMENDA) 5 MG tablet Take 1 tablet (5 mg at night) for 2 weeks, then increase to 1 tablet (5 mg) twice a day   acetaminophen (TYLENOL) 325 MG tablet Take 2 tablets (650 mg total) by mouth every 6 (six) hours as needed for mild pain (or Fever >/= 101). (Patient not taking: Reported on 07/21/2021)   ALPRAZolam (XANAX) 0.25 MG tablet Take 0.25 mg by mouth at bedtime as needed for anxiety. (Patient  not taking: Reported on 07/21/2021)   divalproex (DEPAKOTE) 125 MG DR tablet Take 1 tablet (125 mg total) by mouth 2 (two) times daily.   donepezil (ARICEPT) 10 MG tablet TAKE 1 TABLET BY MOUTH AT NIGHT   Fluticasone-Salmeterol (ADVAIR) 100-50 MCG/DOSE AEPB Inhale 1 puff 2 (two) times daily into the lungs. (Patient not taking: Reported on 07/21/2021)   HYDROcodone-acetaminophen (NORCO/VICODIN) 5-325 MG tablet Take 1 tablet every 6 (six) hours by mouth. (Patient not taking: Reported on 07/21/2021)   lactobacillus (FLORANEX/LACTINEX) PACK Take 1 packet (1 g total) by mouth 3 (three) times daily with meals. (Patient not taking: Reported on 07/21/2021)   memantine (NAMENDA) 10 MG tablet Take 1 tablet  twice a day   Menthol-Zinc Oxide (GOLD BOND EX) Apply 1 application topically daily as needed (for feet and hands). (Patient not taking: Reported on 07/21/2021)   ondansetron (ZOFRAN ODT) 4 MG disintegrating tablet Take 1 tablet (4 mg total) every 8 (eight) hours as needed by mouth for nausea or vomiting. (Patient not taking: No sig reported)   ondansetron (ZOFRAN ODT) 4 MG disintegrating tablet Take 1 tablet (4 mg total) by mouth every 8 (eight) hours as needed for nausea or vomiting. (Patient not taking: No sig reported)   oxybutynin (DITROPAN) 5 MG tablet Take 1 tablet (5 mg total) by mouth 3 (three) times daily. (Patient not taking: No sig reported)   oxyCODONE-acetaminophen (PERCOCET/ROXICET) 5-325 MG tablet Take 1 tablet by mouth every 6 (six) hours as needed for severe pain. (Patient not taking: No sig reported)   Polyethyl Glycol-Propyl Glycol (SYSTANE OP) Place 2 drops into both eyes daily as needed (DRY EYES). (Patient not taking: Reported on 07/21/2021)   polyethylene glycol (MIRALAX / GLYCOLAX) packet Take 17 g by mouth daily. (Patient not taking: Reported on 07/21/2021)   No facility-administered encounter medications on file as of 02/02/2023.       02/02/2023    6:00 PM 08/05/2022    7:00 AM  MMSE -  Mini Mental State Exam  Orientation to time 0 1  Orientation to Place 3 5  Registration 3 3  Attention/ Calculation 5 4  Recall 0 0  Language- name 2 objects 2 2  Language- repeat 0 1  Language- follow  3 step command 2 3  Language- read & follow direction 1 1  Write a sentence 0 1  Copy design 0 1  Total score 16 22       No data to display          Objective:     PHYSICAL EXAMINATION:    VITALS:   Vitals:   02/02/23 1432  BP: (!) 147/72  Pulse: 85  Resp: 18  SpO2: 95%  Weight: 104 lb (47.2 kg)  Height: '5\' 6"'$  (1.676 m)    GEN:  The patient appears stated age and is in NAD. HEENT:  Normocephalic, atraumatic.   Neurological examination:  General: NAD, well-groomed, appears stated age anxious appearing. Orientation: The patient is alert. Oriented to person, place, not to date Cranial nerves: There is good facial symmetry.The speech is fluent and clear. No aphasia or dysarthria. Fund of knowledge is reduced. Recent and remote memory are impaired. Attention and concentration are reduced.  Able to name objects and repeat phrases.  Hearing is intact to conversational tone.  Sensation: Sensation is intact to light touch throughout Motor: Strength is at least antigravity x4. DTR's 2/4 in UE/LE     Movement examination: Tone: There is normal tone in the UE/LE Abnormal movements:  no tremor.  No myoclonus.  No asterixis.   Coordination:  There is no decremation with RAM's. Normal finger to nose  Gait and Station: The patient has no difficulty arising out of a deep-seated chair without the use of the hands. The patient's stride length is good.  Gait is cautious and narrow.    Thank you for allowing [place] the opportunity to participate in the care of this nice patient. Please do not hesitate to contact us for any questions or concerns.   Total time spent on today's visit was 30 minutes dedicated to this patient today, preparing to see patient, examining the patient,  ordering tests and/or medications and counseling the patient, documenting clinical information in the EHR or other health record, independently interpreting results and communicating results to the patient/family, discussing treatment and goals, answering patient's questions and coordinating care.  Cc:  Rankins, Doris Salinas, MD  Sharene Butters 02/02/2023 6:25 PM

## 2023-02-02 NOTE — Patient Instructions (Addendum)
It was a pleasure to see you today at our office.   Recommendations:  Follow up in  6 months Continue donepezil 10 mg daily. Side effects were discussed  Continue  Memantine increase it to 10 mg  twice daily  Continue Depakote 125 mg bid for mood  Try to reduce the caffeine  Whom to call:  Memory  decline, memory medications: Call our office [phone]   For psychiatric meds, mood meds: Please have your primary care physician manage these medications.   Counseling regarding caregiver distress, including caregiver depression, anxiety and issues regarding community resources, adult day care programs, adult living facilities, or memory care questions:   Feel free to contact [facility] [place] [facility], Social Worker at [phone]   For assessment of decision of mental capacity and competency:  Call Dr. Anthoney Harada, geriatric psychiatrist at [phone]  For guidance in geriatric dementia issues please call [facility] [phone]  For guidance regarding [facility] and if placement were needed at the facility, contact Arnell Asal, Social Worker tel: [phone]  If you have any severe symptoms of a stroke, or other severe issues such as confusion,severe chills or fever, etc call 911 or go to the ER as you may need to be evaluated further   Feel free to visit Facebook page " [email or web address]" for tips of how to care for people with memory problems.         RECOMMENDATIONS FOR ALL PATIENTS WITH MEMORY PROBLEMS: 1. Continue to exercise (Recommend 30 minutes of walking everyday, or 3 hours every week) 2. Increase social interactions - continue going to [place] and enjoy social gatherings with friends and family 3. Eat healthy, avoid fried foods and eat more fruits and vegetables 4. Maintain adequate blood pressure, blood sugar, and blood cholesterol level. Reducing the risk of stroke and cardiovascular disease also helps promoting better memory. 5. Avoid  stressful situations. Live a simple life and avoid aggravations. Organize your time and prepare for the next day in anticipation. 6. Sleep well, avoid any interruptions of sleep and avoid any distractions in the bedroom that may interfere with adequate sleep quality 7. Avoid sugar, avoid sweets as there is a strong link between excessive sugar intake, diabetes, and cognitive impairment We discussed the Mediterranean diet, which has been shown to help patients reduce the risk of progressive memory disorders and reduces cardiovascular risk. This includes eating fish, eat fruits and green leafy vegetables, nuts like almonds and hazelnuts, walnuts, and also use olive oil. Avoid fast foods and fried foods as much as possible. Avoid sweets and sugar as sugar use has been linked to worsening of memory function.  There is always a concern of gradual progression of memory problems. If this is the case, then we may need to adjust level of care according to patient needs. Support, both to the patient and caregiver, should then be put into place.    The [employer] is here all day, every day for people facing Alzheimer's disease through our free 24/7 Helpline: [phone]. The Helpline provides reliable information and support to all those who need assistance, such as individuals living with memory loss, Alzheimer's or other dementia, caregivers, health care professionals and the public.  Our highly trained and knowledgeable staff can help you with: Understanding memory loss, dementia and Alzheimer's  Medications and other treatment options  General information about aging and brain health  Skills to provide quality care and to find the best care from professionals  Legal, financial  and living-arrangement decisions Our Helpline also features: Confidential care consultation provided by master's level clinicians who can help with decision-making support, crisis assistance and education on issues  families face every day  Help in a caller's preferred language using our translation service that features more than 200 languages and dialects  Referrals to local community programs, services and ongoing support     FALL PRECAUTIONS: Be cautious when walking. Scan the area for obstacles that may increase the risk of trips and falls. When getting up in the mornings, sit up at the edge of the bed for a few minutes before getting out of bed. Consider elevating the bed at the head end to avoid drop of blood pressure when getting up. Walk always in a well-lit room (use night lights in the walls). Avoid area rugs or power cords from appliances in the middle of the walkways. Use a walker or a cane if necessary and consider physical therapy for balance exercise. Get your eyesight checked regularly.  FINANCIAL OVERSIGHT: Supervision, especially oversight when making financial decisions or transactions is also recommended.  HOME SAFETY: Consider the safety of the kitchen when operating appliances like stoves, microwave oven, and blender. Consider having supervision and share cooking responsibilities until no longer able to participate in those. Accidents with firearms and other hazards in the house should be identified and addressed as well.   ABILITY TO BE LEFT ALONE: If patient is unable to contact 911 operator, consider using LifeLine, or when the need is there, arrange for someone to stay with patients. Smoking is a fire hazard, consider supervision or cessation. Risk of wandering should be assessed by caregiver and if detected at any point, supervision and safe proof recommendations should be instituted.  MEDICATION SUPERVISION: Inability to self-administer medication needs to be constantly addressed. Implement a mechanism to ensure safe administration of the medications.   DRIVING: Regarding driving, in patients with progressive memory problems, driving will be impaired. We advise to have someone  else do the driving if trouble finding directions or if minor accidents are reported. Independent driving assessment is available to determine safety of driving.   If you are interested in the driving assessment, you can contact the following:  [employer] in [place]  [place] [phone] or [phone]      [place] refers to food and lifestyle choices that are based on the traditions of countries located on the [employer]. This way of eating has been shown to help prevent certain conditions and improve outcomes for people who have chronic diseases, like kidney disease and heart disease. What are tips for following this plan? Lifestyle  Cook and eat meals together with your family, when possible. Drink enough fluid to keep your urine clear or pale yellow. Be physically active every day. This includes: Aerobic exercise like running or swimming. Leisure activities like gardening, walking, or housework. Get 7-8 hours of sleep each night. If recommended by your health care provider, drink red wine in moderation. This means 1 glass a day for nonpregnant women and 2 glasses a day for men. A glass of wine equals 5 oz (150 mL). Reading food labels  Check the serving size of packaged foods. For foods such as rice and pasta, the serving size refers to the amount of cooked product, not dry. Check the total fat in packaged foods. Avoid foods that have saturated fat or trans fats. Check  the ingredients list for added sugars, such as corn syrup. Shopping  At the grocery store, buy most of your food from the areas near the walls of the store. This includes: Fresh fruits and vegetables (produce). Grains, beans, nuts, and seeds. Some of these may be available in unpackaged forms or large amounts (in bulk). Fresh seafood. Poultry and  eggs. Low-fat dairy products. Buy whole ingredients instead of prepackaged foods. Buy fresh fruits and vegetables in-season from local farmers markets. Buy frozen fruits and vegetables in resealable bags. If you do not have access to quality fresh seafood, buy precooked frozen shrimp or canned fish, such as tuna, salmon, or sardines. Buy small amounts of raw or cooked vegetables, salads, or olives from the deli or salad bar at your store. Stock your pantry so you always have certain foods on hand, such as olive oil, canned tuna, canned tomatoes, rice, pasta, and beans. Cooking  Cook foods with extra-virgin olive oil instead of using butter or other vegetable oils. Have meat as a side dish, and have vegetables or grains as your main dish. This means having meat in small portions or adding small amounts of meat to foods like pasta or stew. Use beans or vegetables instead of meat in common dishes like chili or lasagna. Experiment with different cooking methods. Try roasting or broiling vegetables instead of steaming or sauteing them. Add frozen vegetables to soups, stews, pasta, or rice. Add nuts or seeds for added healthy fat at each meal. You can add these to yogurt, salads, or vegetable dishes. Marinate fish or vegetables using olive oil, lemon juice, garlic, and fresh herbs. Meal planning  Plan to eat 1 vegetarian meal one day each week. Try to work up to 2 vegetarian meals, if possible. Eat seafood 2 or more times a week. Have healthy snacks readily available, such as: Vegetable sticks with hummus. Greek yogurt. Fruit and nut trail mix. Eat balanced meals throughout the week. This includes: Fruit: 2-3 servings a day Vegetables: 4-5 servings a day Low-fat dairy: 2 servings a day Fish, poultry, or lean meat: 1 serving a day Beans and legumes: 2 or more servings a week Nuts and seeds: 1-2 servings a day Whole grains: 6-8 servings a day Extra-virgin olive oil: 3-4 servings a day Limit  red meat and sweets to only a few servings a month What are my food choices? Mediterranean diet Recommended Grains: Whole-grain pasta. Brown rice. Bulgar wheat. Polenta. Couscous. Whole-wheat bread. [place]. Vegetables: Artichokes. Beets. Broccoli. Cabbage. Carrots. Eggplant. Green beans. Chard. Kale. Spinach. Onions. Leeks. Peas. Squash. Tomatoes. Peppers. Radishes. Fruits: Apples. Apricots. Avocado. Berries. Bananas. Cherries. Dates. Figs. Grapes. Lemons. Melon. Oranges. Peaches. Plums. Pomegranate. Meats and other protein foods: Beans. Almonds. Sunflower seeds. Pine nuts. Peanuts. [place]. Salmon. Scallops. Shrimp. [place]. Tilapia. Clams. Oysters. Eggs. Dairy: Low-fat milk. Cheese. Greek yogurt. Beverages: Water. Red wine. Herbal tea. Fats and oils: Extra virgin olive oil. Avocado oil. Grape seed oil. Sweets and desserts: [place] yogurt with honey. Baked apples. Poached pears. Trail mix. Seasoning and other foods: Basil. Cilantro. Coriander. Cumin. Mint. Parsley. Sage. Rosemary. Tarragon. Garlic. Oregano. Thyme. Pepper. Balsalmic vinegar. Tahini. Hummus. Tomato sauce. Olives. Mushrooms. Limit these Grains: Prepackaged pasta or rice dishes. Prepackaged cereal with added sugar. Vegetables: Deep fried potatoes (french fries). Fruits: Fruit canned in syrup. Meats and other protein foods: Beef. Pork. Lamb. Poultry with skin. Hot dogs. [place] Salines. Dairy: Ice cream. Sour cream. Whole milk. Beverages: Juice. Sugar-sweetened soft drinks. Beer. Liquor and spirits. Fats and oils: Butter.  [place] oil. Vegetable oil. Beef fat (tallow). Lard. Sweets and desserts: Cookies. Cakes. Pies. Candy. Seasoning and other foods: Mayonnaise. Premade sauces and marinades. The items listed may not be a complete list. Talk with your dietitian about what dietary choices are right for you. Summary The Mediterranean diet includes both food and lifestyle choices. Eat a variety of fresh fruits and vegetables, beans, nuts,  seeds, and whole grains. Limit the amount of red meat and sweets that you eat. Talk with your health care provider about whether it is safe for you to drink red wine in moderation. This means 1 glass a day for nonpregnant women and 2 glasses a day for men. A glass of wine equals 5 oz (150 mL). This information is not intended to replace advice given to you by your health care provider. Make sure you discuss any questions you have with your health care provider. Document Released: 07/09/2016 Document Revised: 08/11/2016 Document Reviewed: 07/09/2016 Elsevier Interactive Patient Education  2017 [employer].

## 2023-02-08 DIAGNOSIS — H35033 Hypertensive retinopathy, bilateral: Secondary | ICD-10-CM | POA: Diagnosis not present

## 2023-02-08 DIAGNOSIS — H524 Presbyopia: Secondary | ICD-10-CM | POA: Diagnosis not present

## 2023-02-10 ENCOUNTER — Other Ambulatory Visit: Payer: Self-pay | Admitting: Physician Assistant

## 2023-04-22 DIAGNOSIS — H2511 Age-related nuclear cataract, right eye: Secondary | ICD-10-CM | POA: Diagnosis not present

## 2023-04-22 DIAGNOSIS — H40023 Open angle with borderline findings, high risk, bilateral: Secondary | ICD-10-CM | POA: Diagnosis not present

## 2023-04-22 DIAGNOSIS — H2513 Age-related nuclear cataract, bilateral: Secondary | ICD-10-CM | POA: Diagnosis not present

## 2023-04-22 DIAGNOSIS — H40033 Anatomical narrow angle, bilateral: Secondary | ICD-10-CM | POA: Diagnosis not present

## 2023-04-22 DIAGNOSIS — H40031 Anatomical narrow angle, right eye: Secondary | ICD-10-CM | POA: Diagnosis not present

## 2023-04-22 DIAGNOSIS — H18413 Arcus senilis, bilateral: Secondary | ICD-10-CM | POA: Diagnosis not present

## 2023-04-30 DIAGNOSIS — Z1389 Encounter for screening for other disorder: Secondary | ICD-10-CM | POA: Diagnosis not present

## 2023-04-30 DIAGNOSIS — Z0000 Encounter for general adult medical examination without abnormal findings: Secondary | ICD-10-CM | POA: Diagnosis not present

## 2023-04-30 DIAGNOSIS — Z681 Body mass index (BMI) 19 or less, adult: Secondary | ICD-10-CM | POA: Diagnosis not present

## 2023-06-01 ENCOUNTER — Other Ambulatory Visit: Payer: Self-pay | Admitting: Physician Assistant

## 2023-06-26 ENCOUNTER — Other Ambulatory Visit: Payer: Self-pay | Admitting: Physician Assistant

## 2023-07-24 ENCOUNTER — Other Ambulatory Visit: Payer: Self-pay | Admitting: Physician Assistant

## 2023-08-05 ENCOUNTER — Telehealth (INDEPENDENT_AMBULATORY_CARE_PROVIDER_SITE_OTHER): Payer: Medicare HMO | Admitting: Physician Assistant

## 2023-08-05 DIAGNOSIS — G309 Alzheimer's disease, unspecified: Secondary | ICD-10-CM

## 2023-08-05 DIAGNOSIS — F0280 Dementia in other diseases classified elsewhere without behavioral disturbance: Secondary | ICD-10-CM | POA: Diagnosis not present

## 2023-08-05 MED ORDER — DONEPEZIL HCL 23 MG PO TABS: ORAL_TABLET | ORAL | 11 refills | Status: DC

## 2023-08-05 NOTE — Progress Notes (Signed)
Virtual Visit via Video Note The purpose of this virtual visit is to provide medical care in a patient that is unable to be seen in person due to physical or health limitations   Consent was obtained for video visit:  yes  Answered questions that patient had about telehealth interaction:  yes I discussed the limitations, risks, security and privacy concerns of performing an evaluation and management service by telemedicine. I also discussed with the patient that there may be a patient responsible charge related to this service. The patient expressed understanding and agreed to proceed.  Pt location: Home Physician Location: office Name of referring provider:  Rankins, Fanny Dance, MD I connected with Magnus Sinning Figiel at patients initiation/request on 08/05/2023 at  3:00 PM EDT by video enabled telemedicine application and verified that I am speaking with the correct person using two identifiers. Pt MRN:  [id number] Pt DOB:  Jan 21, 1946 Video Participants:  Magnus Sinning Gene;      Assessment and Plan:    Dementia likely due to Alzheimer's with disease with behavioral disturbance  KAIDEN LICEA is a very pleasant 77 y.o. RH female with a history of hypertension, hyperlipidemia, DM2, history of head injury 2016, COPD/asthma anxiety, anxiety, depression and a history of dementia likely due to Alzheimer's disease with behavioral disturbance seen today in follow up via video visit for memory loss. Patient is currently on donepezil 10 mg daily and memantine 10 mg twice daily as well as on Depakote 125 mg twice daily for mood.  Cognitive and behavioral decline is noted, and she requires more surveillance than prior.  Scheduled to come to our office, but at the last minute she refused to come so it has to be transferred to a video visit.  Memory is worse, sometimes having trouble recognizing places or names of family members.    Follow up in 6  months. Donepezil to 23 mg daily  and continue memantine 10 mg twice daily, side effects discussed. Continue Depakote 125 mg twice a day  for mood, side effects discussed  Recommend good control of her cardiovascular risk factors Continue to control mood as per PCP   History of Present Illness:    Any changes in memory?  Reports significant changes especially when recognizing members of her own family, and their names.  She is in complete denial of any memory loss, stating that she is "feeling great".  She may forget at times the names of objects and calls everything "telephone "refers to someone as "the woman " repeats oneself? Endorsed Disoriented when walking into a room?  Not frequently. Leaving objects in unusual places?  Not in unusual places Ambulates with difficulty?  She is less coordinated than before. Recent falls?  Denies Any head injuries?  Denies History of seizures?  Denies Wandering behavior?  Denies Any mood changes?  She has become more argumentative than prior.  She feels that everybody is against her, and that going to doctors offices is not an option, because "they are trying to take my money " Any worsening depression?  Denies Hallucinations?  Denies Paranoia?  No frank paranoia, but feels that everybody is against her. Patient reports that sleeps well without vivid dreams, REM behavior or sleepwalking   Any hygiene concerns? Independent of bathing and dressing? Does the patient needs help with medications?  Son is in charge.   Any changes in appetite?  Has decreased appetite, eating a few bites at the time.  If the food is not prepared for her, she would not initiate preparing anything.  Daughter reports that she has been losing some weight. Patient have trouble swallowing?  Denies Any headaches?  Denies  double vision?  Denies Any focal numbness or tingling?  Denies Any pain?  Some arthritic pain. Unilateral weakness?  Denies Any tremors?  Denies Any incontinence of urine?  Endorsed, her  daughter reports that she has been waiting accidentally results Any bowel dysfunction?  She has bouts of diarrhea although is unclear if it is attributed to bowel incontinence.    Current Outpatient Medications on File Prior to Visit  Medication Sig Dispense Refill   acetaminophen (TYLENOL) 325 MG tablet Take 2 tablets (650 mg total) by mouth every 6 (six) hours as needed for mild pain (or Fever >/= 101). (Patient not taking: Reported on 07/21/2021)     alendronate (FOSAMAX) 70 MG tablet Take 70 mg by mouth every 7 (seven) days. Take with a full glass of water on an empty stomach. Takes on Tuesday.     ALPRAZolam (XANAX) 0.25 MG tablet Take 0.25 mg by mouth at bedtime as needed for anxiety. (Patient not taking: Reported on 07/21/2021)     aspirin 325 MG tablet Take 325 mg by mouth daily.     atorvastatin (LIPITOR) 40 MG tablet Take 40 mg by mouth every evening.      divalproex (DEPAKOTE) 125 MG DR tablet Take 1 tablet (125 mg total) by mouth 2 (two) times daily. 60 tablet 11   donepezil (ARICEPT) 10 MG tablet TAKE 1 TABLET BY MOUTH EVERY DAY AT NIGHT 90 tablet 1   ferrous sulfate 324 MG TBEC Take 324 mg by mouth.     Fluticasone-Salmeterol (ADVAIR) 100-50 MCG/DOSE AEPB Inhale 1 puff 2 (two) times daily into the lungs. (Patient not taking: Reported on 07/21/2021)     glimepiride (AMARYL) 2 MG tablet Take 2 mg by mouth daily.  0   HYDROcodone-acetaminophen (NORCO/VICODIN) 5-325 MG tablet Take 1 tablet every 6 (six) hours by mouth. (Patient not taking: Reported on 07/21/2021)     lactobacillus (FLORANEX/LACTINEX) PACK Take 1 packet (1 g total) by mouth 3 (three) times daily with meals. (Patient not taking: Reported on 07/21/2021) 30 packet 0   memantine (NAMENDA) 10 MG tablet TAKE 1 TABLET BY MOUTH TWICE A DAY 180 tablet 1   Menthol-Zinc Oxide (GOLD BOND EX) Apply 1 application topically daily as needed (for feet and hands). (Patient not taking: Reported on 07/21/2021)     metFORMIN (GLUCOPHAGE-XR) 500 MG  24 hr tablet      Multiple Vitamin (MULITIVITAMIN WITH MINERALS) TABS Take 1 tablet by mouth daily.     ondansetron (ZOFRAN ODT) 4 MG disintegrating tablet Take 1 tablet (4 mg total) every 8 (eight) hours as needed by mouth for nausea or vomiting. (Patient not taking: No sig reported) 20 tablet 0   ondansetron (ZOFRAN ODT) 4 MG disintegrating tablet Take 1 tablet (4 mg total) by mouth every 8 (eight) hours as needed for nausea or vomiting. (Patient not taking: No sig reported) 20 tablet 0   oxybutynin (DITROPAN) 5 MG tablet Take 1 tablet (5 mg total) by mouth 3 (three) times daily. (Patient not taking: No sig reported) 15 tablet 0   oxyCODONE-acetaminophen (PERCOCET/ROXICET) 5-325 MG tablet Take 1 tablet by mouth every 6 (six) hours as needed for severe pain. (Patient not taking: No sig reported) 11 tablet 0   Polyethyl Glycol-Propyl Glycol (SYSTANE OP) Place 2 drops into both  eyes daily as needed (DRY EYES). (Patient not taking: Reported on 07/21/2021)     polyethylene glycol (MIRALAX / GLYCOLAX) packet Take 17 g by mouth daily. (Patient not taking: Reported on 07/21/2021) 14 each 0   valsartan (DIOVAN) 80 MG tablet Take 80 mg by mouth daily.     No current facility-administered medications on file prior to visit.   Priorly reviewed MRI of the brain 08/07/2021 was remarkable for moderate chronic small vessel ischemic  changes of the cerebral hemispheric white matter.  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  No acute changes were seen.    Initial evaluation 07/21/2021 the patient is seen in neurologic consultation at the request of Rankins, Fanny Dance, MD for the evaluation of memory.  The patient is accompanied by her daughter  who supplements the history. She is a 77 y.o. year old female who has had memory issues for about 4 years, when she began retelling stories, and asking the same questions.  In Thanksgiving 2020, the patient  was repetitively asking for cornstarch to her daughter, who at that point began to be worried that there was "something more to it ".  About 1 year ago, the patient began to forget to take her medications, or remember which medication she was taking.  Her daughter has to have someone coming in monitoring this meds.  She initially felt that memory loss was related to anxiety and nervousness with testing, and that she was " never good spelling or math, having not had finished high school". The patient lives alone, and her children monitor her on a regular basis, as they live nearby.  She likes coloring books, and she dislikes crossword puzzles.  She likes to watch TV and socialize frequently.  Her mood has been always anxious, but there is no apparent depression.  She has been irritable at times especially when she cannot remember.  She reports sleeping well, but recently, she is not sure that she was having vivid dreams, or hallucinations.  She was on Seroquel, and she attributes these changes to the medicine, which are now less frequent, and these hallucinations have resolved.  They were described as "frames coming off of the wall, last episode about 1 week ago ".  She is also positive of her pocketbook, but denies paranoia, although her daughter "is not that sure ", most recently, she accused her of hiding $100 from the picture frames.  She is independent of bathing and dressing, that he had she has been showing hoarding behavior.  Her appetite is good, but has become more picky about the choices of food.  She denies trouble swallowing.  She cooks and denies leaving the stove on.  Ambulates without difficulty without walker or cane.  She had a recent mechanical fall due to being tangled with a dog leash.  No fractures or head injury.  She has not been driving, as she lost interest in doing so.  She denies any headaches, double vision, dizziness, focal numbness or tingling, unilateral weakness or tremors.  She  denies incontinence or retention, or constipation.  She likes to drink excessive amount of coffee, which may contribute to some diarrhea.  She denies anosmia.  No history of OSA, alcohol.  She quit in 1991 the use of 3 pack a day of tobacco.  Family history for maternal grandmother with dementia.      Neurocognitive Test 07/09/21 Dr. Milbert Coulter " The results of  cognitive testing is dementia due to Alzheimer's disease. Ms. Pfannenstiel had notable trouble learning new information, even with additional learning trials, and exhibited a flat learning curve across assessments. She was also fully amnestic (i.e., retention percentages of 0%) across all administered memory tasks, suggesting an ongoing and notable impairment storing newly learned information. This pattern of memory dysfunction is the hallmark characteristic of this condition. Additional dysfunction surrounding confrontation naming, semantic fluency, and visuospatial abilities are further consistent with Alzheimer's disease. Taken together, she does exhibit a fairly classic pattern.  Observations/Objective:   There were no vitals filed for this visit. GEN:  The patient appears stated age and is in NAD.  Neurological examination: Patient is awake, alert, oriented to person, place or date. No aphasia or dysarthria. I patient is fluent, but sometimes tangential, she may not respond to the addressed question. decreased  comprehension. Remote and recent memory impaired.Unable to repeat phrases. Cranial nerves: Extraocular movements intact with no nystagmus. No facial asymmetry. Motor: moves all extremities symmetrically, at least anti-gravity x 4. No incoordination on finger to nose Gait: narrow-based and steady, able to tandem walk adequately but slow.      Follow Up Instructions:    -I discussed the assessment and treatment plan with the patient. The patient was provided an opportunity to ask questions and all were answered. The patient agreed with the plan  and demonstrated an understanding of the instructions.   The patient was advised to call back or seek an in-person evaluation if the symptoms worsen or if the condition fails to improve as anticipated.    Total time spent on today's visit was 20 minutes, including both face-to-face time and nonface-to-face time.  Time included that spent on review of records (prior notes available to me/labs/imaging if pertinent), discussing treatment and goals, answering patient's questions and coordinating care.   Marlowe Kays, PA-C

## 2023-08-13 DIAGNOSIS — F0280 Dementia in other diseases classified elsewhere without behavioral disturbance: Secondary | ICD-10-CM | POA: Diagnosis not present

## 2023-08-13 DIAGNOSIS — Z681 Body mass index (BMI) 19 or less, adult: Secondary | ICD-10-CM | POA: Diagnosis not present

## 2023-08-13 DIAGNOSIS — E46 Unspecified protein-calorie malnutrition: Secondary | ICD-10-CM | POA: Diagnosis not present

## 2023-08-13 DIAGNOSIS — E1136 Type 2 diabetes mellitus with diabetic cataract: Secondary | ICD-10-CM | POA: Diagnosis not present

## 2023-08-13 DIAGNOSIS — E1169 Type 2 diabetes mellitus with other specified complication: Secondary | ICD-10-CM | POA: Diagnosis not present

## 2023-08-13 DIAGNOSIS — F22 Delusional disorders: Secondary | ICD-10-CM | POA: Diagnosis not present

## 2023-08-13 DIAGNOSIS — G309 Alzheimer's disease, unspecified: Secondary | ICD-10-CM | POA: Diagnosis not present

## 2023-08-17 DIAGNOSIS — H52203 Unspecified astigmatism, bilateral: Secondary | ICD-10-CM | POA: Diagnosis not present

## 2023-08-17 DIAGNOSIS — Z0101 Encounter for examination of eyes and vision with abnormal findings: Secondary | ICD-10-CM | POA: Diagnosis not present

## 2023-08-17 DIAGNOSIS — H409 Unspecified glaucoma: Secondary | ICD-10-CM | POA: Diagnosis not present

## 2023-08-17 DIAGNOSIS — H25813 Combined forms of age-related cataract, bilateral: Secondary | ICD-10-CM | POA: Diagnosis not present

## 2023-08-17 DIAGNOSIS — Z79899 Other long term (current) drug therapy: Secondary | ICD-10-CM | POA: Diagnosis not present

## 2023-08-17 DIAGNOSIS — H40039 Anatomical narrow angle, unspecified eye: Secondary | ICD-10-CM | POA: Diagnosis not present

## 2023-09-07 DIAGNOSIS — H2513 Age-related nuclear cataract, bilateral: Secondary | ICD-10-CM | POA: Diagnosis not present

## 2023-09-08 DIAGNOSIS — Z9842 Cataract extraction status, left eye: Secondary | ICD-10-CM | POA: Diagnosis not present

## 2023-09-08 DIAGNOSIS — H40039 Anatomical narrow angle, unspecified eye: Secondary | ICD-10-CM | POA: Diagnosis not present

## 2023-09-08 DIAGNOSIS — Z9841 Cataract extraction status, right eye: Secondary | ICD-10-CM | POA: Diagnosis not present

## 2023-09-08 DIAGNOSIS — H409 Unspecified glaucoma: Secondary | ICD-10-CM | POA: Diagnosis not present

## 2023-09-20 ENCOUNTER — Encounter (HOSPITAL_COMMUNITY): Payer: Self-pay

## 2023-09-20 ENCOUNTER — Other Ambulatory Visit: Payer: Self-pay

## 2023-09-20 ENCOUNTER — Emergency Department (HOSPITAL_COMMUNITY)
Admission: EM | Admit: 2023-09-20 | Discharge: 2023-09-20 | Disposition: A | Discharge status: Home / Self Care | Payer: Medicare HMO | Attending: Emergency Medicine | Admitting: Emergency Medicine

## 2023-09-20 DIAGNOSIS — I10 Essential (primary) hypertension: Secondary | ICD-10-CM | POA: Diagnosis not present

## 2023-09-20 DIAGNOSIS — R110 Nausea: Secondary | ICD-10-CM | POA: Diagnosis not present

## 2023-09-20 DIAGNOSIS — R112 Nausea with vomiting, unspecified: Secondary | ICD-10-CM | POA: Diagnosis not present

## 2023-09-20 DIAGNOSIS — Z79899 Other long term (current) drug therapy: Secondary | ICD-10-CM | POA: Insufficient documentation

## 2023-09-20 DIAGNOSIS — Z7982 Long term (current) use of aspirin: Secondary | ICD-10-CM | POA: Diagnosis not present

## 2023-09-20 DIAGNOSIS — H5711 Ocular pain, right eye: Secondary | ICD-10-CM | POA: Insufficient documentation

## 2023-09-20 LAB — HEPATIC FUNCTION PANEL
ALT: 15 U/L (ref 0–44)
AST: 15 U/L (ref 15–41)
Albumin: 3.7 g/dL (ref 3.5–5.0)
Alkaline Phosphatase: 44 U/L (ref 38–126)
Bilirubin, Direct: 0.2 mg/dL (ref 0.0–0.2)
Indirect Bilirubin: 0.7 mg/dL (ref 0.3–0.9)
Total Bilirubin: 0.9 mg/dL (ref 0.3–1.2)
Total Protein: 7 g/dL (ref 6.5–8.1)

## 2023-09-20 LAB — BASIC METABOLIC PANEL
Anion gap: 9 (ref 5–15)
BUN: 10 mg/dL (ref 8–23)
CO2: 27 mmol/L (ref 22–32)
Calcium: 9.2 mg/dL (ref 8.9–10.3)
Chloride: 104 mmol/L (ref 98–111)
Creatinine, Ser: 0.45 mg/dL (ref 0.44–1.00)
GFR, Estimated: >60 mL/min (ref >60)
Glucose, Bld: 112 mg/dL — ABNORMAL HIGH (ref 70–99)
Potassium: 4 mmol/L (ref 3.5–5.1)
Sodium: 140 mmol/L (ref 135–145)

## 2023-09-20 LAB — CBC WITH DIFFERENTIAL/PLATELET
Abs Immature Granulocytes: 0.02 10*3/uL (ref 0.00–0.07)
Basophils Absolute: 0 10*3/uL (ref 0.0–0.1)
Basophils Relative: 1 %
Eosinophils Absolute: 0.1 10*3/uL (ref 0.0–0.5)
Eosinophils Relative: 2 %
HCT: 40.4 % (ref 36.0–46.0)
Hemoglobin: 13.5 g/dL (ref 12.0–15.0)
Immature Granulocytes: 0 %
Lymphocytes Relative: 22 %
Lymphs Abs: 1.8 10*3/uL (ref 0.7–4.0)
MCH: 30.8 pg (ref 26.0–34.0)
MCHC: 33.4 g/dL (ref 30.0–36.0)
MCV: 92.2 fL (ref 80.0–100.0)
Monocytes Absolute: 0.9 10*3/uL (ref 0.1–1.0)
Monocytes Relative: 10 %
Neutro Abs: 5.7 10*3/uL (ref 1.7–7.7)
Neutrophils Relative %: 65 %
Platelets: 260 10*3/uL (ref 150–400)
RBC: 4.38 MIL/uL (ref 3.87–5.11)
RDW: 13.7 % (ref 11.5–15.5)
WBC: 8.5 10*3/uL (ref 4.0–10.5)
nRBC: 0 % (ref 0.0–0.2)

## 2023-09-20 LAB — MAGNESIUM: Magnesium: 1.7 mg/dL (ref 1.7–2.4)

## 2023-09-20 MED ORDER — TETRACAINE HCL 0.5 % OP SOLN
2.0000 [drp] | Freq: Once | OPHTHALMIC | Status: AC
  Administered 2023-09-20: 2 [drp] via OPHTHALMIC
  Filled 2023-09-20: qty 4

## 2023-09-20 MED ORDER — ACETAMINOPHEN 325 MG PO TABS
650.0000 mg | ORAL_TABLET | Freq: Once | ORAL | Status: AC
  Administered 2023-09-20: 650 mg via ORAL
  Filled 2023-09-20: qty 2

## 2023-09-20 NOTE — ED Triage Notes (Signed)
Pt to ED c/o right eye pain x 2 weeks. Reports cataract sx 3 weeks ago. Redness noted to eye. No vision changes.

## 2023-09-20 NOTE — ED Provider Notes (Signed)
[place] EMERGENCY DEPARTMENT AT [facility] Provider Note   CSN: [id number] Arrival date & time: 09/20/23  7829     History  Chief Complaint  Patient presents with   Eye Pain    Doris Wyatt is a 77 y.o. female.  Patient presents with right eye pain for approximately 2 weeks.  Started shortly after surgery that she had on October 8 for cataract bilateral.  Patient has an appointment tomorrow to follow-up with Reynold Bowen for postop.  No fever or chills, no significant drainage.  Mild redness to the eye.  Patient been compliant with taking her antibiotic, steroid and other eyedrops prescribed.  Patient's had decreased appetite for a month with unknown amount of weight loss.  No known cancer.  No blood in the stools.  Patient does not think she has a history of significant glaucoma.   Eye Pain Pertinent negatives include no chest pain, no abdominal pain, no headaches and no shortness of breath.       Home Medications Prior to Admission medications   Medication Sig Start Date End Date Taking? Authorizing Provider  acetaminophen (TYLENOL) 325 MG tablet Take 2 tablets (650 mg total) by mouth every 6 (six) hours as needed for mild pain (or Fever >/= 101). Patient not taking: Reported on 07/21/2021 11/04/17   Maxie Barb, MD  alendronate (FOSAMAX) 70 MG tablet Take 70 mg by mouth every 7 (seven) days. Take with a full glass of water on an empty stomach. Takes on Tuesday.    [provider]  ALPRAZolam Prudy Feeler) 0.25 MG tablet Take 0.25 mg by mouth at bedtime as needed for anxiety. Patient not taking: Reported on 07/21/2021    [provider]  aspirin 325 MG tablet Take 325 mg by mouth daily.    [provider]  atorvastatin (LIPITOR) 40 MG tablet Take 40 mg by mouth every evening.     [provider]  divalproex (DEPAKOTE) 125 MG DR tablet Take 1 tablet (125 mg total) by mouth 2 (two) times daily. 02/02/23   Marcos Eke,  PA-C  donepezil (ARICEPT) 23 MG TABS tablet TAKE 1 TABLET BY MOUTH EVERY DAY AT NIGHT 08/05/23   Marcos Eke, PA-C  ferrous sulfate 324 MG TBEC Take 324 mg by mouth.    [provider]  Fluticasone-Salmeterol (ADVAIR) 100-50 MCG/DOSE AEPB Inhale 1 puff 2 (two) times daily into the lungs. Patient not taking: Reported on 07/21/2021    [provider]  glimepiride (AMARYL) 2 MG tablet Take 2 mg by mouth daily. 10/16/16   [provider]  HYDROcodone-acetaminophen (NORCO/VICODIN) 5-325 MG tablet Take 1 tablet every 6 (six) hours by mouth. Patient not taking: Reported on 07/21/2021 08/25/17   [provider]  lactobacillus (FLORANEX/LACTINEX) PACK Take 1 packet (1 g total) by mouth 3 (three) times daily with meals. Patient not taking: Reported on 07/21/2021 11/04/17   Maxie Barb, MD  memantine (NAMENDA) 10 MG tablet TAKE 1 TABLET BY MOUTH TWICE A DAY 06/28/23   Marcos Eke, PA-C  Menthol-Zinc Oxide (GOLD BOND EX) Apply 1 application topically daily as needed (for feet and hands). Patient not taking: Reported on 07/21/2021    [provider]  metFORMIN (GLUCOPHAGE-XR) 500 MG 24 hr tablet  05/30/21   [provider]  Multiple Vitamin (MULITIVITAMIN WITH MINERALS) TABS Take 1 tablet by mouth daily.    [provider]  ondansetron (ZOFRAN ODT) 4 MG disintegrating tablet Take 1 tablet (4  mg total) every 8 (eight) hours as needed by mouth for nausea or vomiting. Patient not taking: No sig reported 10/13/17   Derwood Kaplan, MD  ondansetron (ZOFRAN ODT) 4 MG disintegrating tablet Take 1 tablet (4 mg total) by mouth every 8 (eight) hours as needed for nausea or vomiting. Patient not taking: No sig reported 11/10/17   Mackuen, Courteney Lyn, MD  oxybutynin (DITROPAN) 5 MG tablet Take 1 tablet (5 mg total) by mouth 3 (three) times daily. Patient not taking: No sig reported 11/04/17   Maxie Barb, MD  oxyCODONE-acetaminophen  (PERCOCET/ROXICET) 5-325 MG tablet Take 1 tablet by mouth every 6 (six) hours as needed for severe pain. Patient not taking: No sig reported 11/10/17   Mackuen, Courteney Lyn, MD  Polyethyl Glycol-Propyl Glycol (SYSTANE OP) Place 2 drops into both eyes daily as needed (DRY EYES). Patient not taking: Reported on 07/21/2021    [provider]  polyethylene glycol (MIRALAX / GLYCOLAX) packet Take 17 g by mouth daily. Patient not taking: Reported on 07/21/2021 11/05/17   Maxie Barb, MD  valsartan (DIOVAN) 80 MG tablet Take 80 mg by mouth daily.    [provider]      Allergies    Morphine sulfate, Oxycodone-acetaminophen, and Sertraline hcl    Review of Systems   Review of Systems  Constitutional:  Positive for appetite change and unexpected weight change. Negative for chills and fever.  HENT:  Negative for congestion.   Eyes:  Positive for pain and redness. Negative for visual disturbance.  Respiratory:  Negative for shortness of breath.   Cardiovascular:  Negative for chest pain.  Gastrointestinal:  Negative for abdominal pain and vomiting.  Genitourinary:  Negative for dysuria and flank pain.  Musculoskeletal:  Negative for back pain, neck pain and neck stiffness.  Skin:  Negative for rash.  Neurological:  Negative for light-headedness and headaches.    Physical Exam Updated Vital Signs BP (!) 189/64   Pulse 80   Temp (!) 97.5 F (36.4 C) (Oral)   Resp 15   SpO2 96%  Physical Exam Vitals and nursing note reviewed.  Constitutional:      General: She is not in acute distress.    Appearance: She is well-developed.  HENT:     Head: Normocephalic.     Comments: Patient has minimal conjunctival injection on the right, pupil equal responsive to light bilateral.  No pain with extraocular muscle function.  Visual field blurry to fingers on patient's right periphery can identify 1 and 2 fingers, blurry medial aspect of right vision.    Mouth/Throat:      Mouth: Mucous membranes are moist.  Eyes:     General: No scleral icterus.       Left eye: No discharge.  Neck:     Trachea: No tracheal deviation.  Cardiovascular:     Rate and Rhythm: Normal rate and regular rhythm.     Heart sounds: No murmur heard. Pulmonary:     Effort: Pulmonary effort is normal.     Breath sounds: Normal breath sounds.  Abdominal:     General: There is no distension.     Palpations: Abdomen is soft.     Tenderness: There is no abdominal tenderness. There is no guarding.  Musculoskeletal:     Cervical back: Normal range of motion and neck supple.  Skin:    General: Skin is warm.     Capillary Refill: Capillary refill takes less than 2 seconds.  Findings: No rash.  Neurological:     General: No focal deficit present.     Mental Status: She is alert.     Cranial Nerves: No cranial nerve deficit.  Psychiatric:        Mood and Affect: Mood normal.     ED Results / Procedures / Treatments   Labs (all labs ordered are listed, but only abnormal results are displayed) Labs Reviewed  BASIC METABOLIC PANEL - Abnormal; Notable for the following components:      Result Value   Glucose, Bld 112 (*)    All other components within normal limits  CBC WITH DIFFERENTIAL/PLATELET  HEPATIC FUNCTION PANEL  MAGNESIUM    EKG None  Radiology No results found.  Procedures Procedures    Medications Ordered in ED Medications  tetracaine (PONTOCAINE) 0.5 % ophthalmic solution 2 drop (2 drops Right Eye Given by Other 09/20/23 1900)  acetaminophen (TYLENOL) tablet 650 mg (650 mg Oral Given 09/20/23 2012)    ED Course/ Medical Decision Making/ A&P                                 Medical Decision Making Amount and/or Complexity of Data Reviewed Labs: ordered.  Risk OTC drugs. Prescription drug management.   Patient presents with persistent eye discomfort nausea and weight loss since surgery.  Patient fortunately has appointment for specialist  tomorrow.  Medical records reviewed in care everywhere describing ophthalmology laser surgery/cataract removal Dr Herminio Commons 10/9.  Patient has no clinical evidence of periorbital cellulitis.  Low suspicion for orbital cellulitis given no pain with extraocular muscle function, no purulent drainage no fevers, pain prolonged since post op day 2.  No evidence of acute glaucoma, intraocular pressure checked with Tono-Pen 19.   Patient's had weight loss and nausea that started even before surgery, discussed eye pain can make you nauseated.  Other differentials include malignancy, gastric related, bowel, medication side effect, other.  Plan for screening blood work, Tylenol for pain and oral fluids. Patient will parent reassessment, discomfort mild.  Blood pressure was elevated in the ED, discussed will need follow-up with primary doctor.  In the room discharge blood pressure 180s over 60s.  Patient having no stroke symptoms/shortness of breath/chest pain.  Patient has an appointment she will go to for eye doctor in the follow-up with her primary doctor.  Blood work reassuring electrolytes unremarkable, no signs of anemia, albumin normal.  Patient has eyedrops to take.         Final Clinical Impression(s) / ED Diagnoses Final diagnoses:  Acute right eye pain  Nausea  Hypertension, unspecified type    Rx / DC Orders ED Discharge Orders     None         Blane Ohara, MD 09/20/23 2222

## 2023-09-20 NOTE — ED Notes (Signed)
Pt eating and drinking

## 2023-09-20 NOTE — Discharge Instructions (Addendum)
Follow-up with your doctor tomorrow at the appointment. Use Tylenol every 4 hours as needed for pain. Continue eyedrops until you see your specialist. Your eye pressure was 19 in the ER. Follow-up with your primary doctor for weight loss and nausea.

## 2023-09-21 DIAGNOSIS — H40033 Anatomical narrow angle, bilateral: Secondary | ICD-10-CM | POA: Diagnosis not present

## 2023-09-21 DIAGNOSIS — H409 Unspecified glaucoma: Secondary | ICD-10-CM | POA: Diagnosis not present

## 2023-09-21 DIAGNOSIS — Z48810 Encounter for surgical aftercare following surgery on the sense organs: Secondary | ICD-10-CM | POA: Diagnosis not present

## 2023-09-21 DIAGNOSIS — Z9841 Cataract extraction status, right eye: Secondary | ICD-10-CM | POA: Diagnosis not present

## 2023-09-21 DIAGNOSIS — F0390 Unspecified dementia without behavioral disturbance: Secondary | ICD-10-CM | POA: Diagnosis not present

## 2023-09-21 DIAGNOSIS — Z9842 Cataract extraction status, left eye: Secondary | ICD-10-CM | POA: Diagnosis not present

## 2023-09-21 DIAGNOSIS — Z961 Presence of intraocular lens: Secondary | ICD-10-CM | POA: Diagnosis not present

## 2023-10-15 DIAGNOSIS — Z961 Presence of intraocular lens: Secondary | ICD-10-CM | POA: Diagnosis not present

## 2023-10-15 DIAGNOSIS — H40033 Anatomical narrow angle, bilateral: Secondary | ICD-10-CM | POA: Diagnosis not present

## 2023-10-15 DIAGNOSIS — Z48810 Encounter for surgical aftercare following surgery on the sense organs: Secondary | ICD-10-CM | POA: Diagnosis not present

## 2023-10-15 DIAGNOSIS — Z9842 Cataract extraction status, left eye: Secondary | ICD-10-CM | POA: Diagnosis not present

## 2023-10-15 DIAGNOSIS — H0489 Other disorders of lacrimal system: Secondary | ICD-10-CM | POA: Diagnosis not present

## 2023-10-15 DIAGNOSIS — Z9841 Cataract extraction status, right eye: Secondary | ICD-10-CM | POA: Diagnosis not present

## 2023-12-21 ENCOUNTER — Other Ambulatory Visit: Payer: Self-pay | Admitting: Physician Assistant

## 2024-01-16 ENCOUNTER — Other Ambulatory Visit: Payer: Self-pay | Admitting: Physician Assistant

## 2024-02-15 DIAGNOSIS — E1169 Type 2 diabetes mellitus with other specified complication: Secondary | ICD-10-CM | POA: Diagnosis not present

## 2024-02-15 DIAGNOSIS — I10 Essential (primary) hypertension: Secondary | ICD-10-CM | POA: Diagnosis not present

## 2024-02-15 DIAGNOSIS — E1165 Type 2 diabetes mellitus with hyperglycemia: Secondary | ICD-10-CM | POA: Diagnosis not present

## 2024-02-15 DIAGNOSIS — E1136 Type 2 diabetes mellitus with diabetic cataract: Secondary | ICD-10-CM | POA: Diagnosis not present

## 2024-02-15 DIAGNOSIS — E7800 Pure hypercholesterolemia, unspecified: Secondary | ICD-10-CM | POA: Diagnosis not present

## 2024-02-15 DIAGNOSIS — J4520 Mild intermittent asthma, uncomplicated: Secondary | ICD-10-CM | POA: Diagnosis not present

## 2024-02-15 DIAGNOSIS — Z681 Body mass index (BMI) 19 or less, adult: Secondary | ICD-10-CM | POA: Diagnosis not present

## 2024-02-29 DIAGNOSIS — E1165 Type 2 diabetes mellitus with hyperglycemia: Secondary | ICD-10-CM | POA: Diagnosis not present

## 2024-02-29 DIAGNOSIS — G309 Alzheimer's disease, unspecified: Secondary | ICD-10-CM | POA: Diagnosis not present

## 2024-02-29 DIAGNOSIS — R6889 Other general symptoms and signs: Secondary | ICD-10-CM | POA: Diagnosis not present

## 2024-03-16 ENCOUNTER — Other Ambulatory Visit: Payer: Self-pay | Admitting: Physician Assistant

## 2024-05-21 ENCOUNTER — Other Ambulatory Visit: Payer: Self-pay | Admitting: Physician Assistant

## 2024-06-14 ENCOUNTER — Telehealth: Payer: Self-pay | Admitting: Physician Assistant

## 2024-06-14 ENCOUNTER — Other Ambulatory Visit: Payer: Self-pay | Admitting: Physician Assistant

## 2024-06-14 MED ORDER — DIVALPROEX SODIUM 125 MG PO DR TAB: 125.0000 mg | DELAYED_RELEASE_TABLET | Freq: Two times a day (BID) | ORAL | 31 refills | Status: DC

## 2024-06-14 NOTE — Telephone Encounter (Signed)
 Pt. Daughter called Pt needs Refill of Rx of Divalproex , was not on sched cld but no answer LMOM

## 2024-06-16 ENCOUNTER — Other Ambulatory Visit: Payer: Self-pay | Admitting: Physician Assistant

## 2024-08-04 DIAGNOSIS — B354 Tinea corporis: Secondary | ICD-10-CM | POA: Diagnosis not present

## 2024-09-07 ENCOUNTER — Other Ambulatory Visit: Payer: Self-pay

## 2024-09-07 ENCOUNTER — Inpatient Hospital Stay (HOSPITAL_COMMUNITY)
Admission: EM | Admit: 2024-09-07 | Discharge: 2024-09-14 | DRG: 481 | Disposition: A | Discharge status: Skilled Nursing Facility | Attending: Internal Medicine | Admitting: Internal Medicine

## 2024-09-07 ENCOUNTER — Inpatient Hospital Stay (HOSPITAL_COMMUNITY)

## 2024-09-07 ENCOUNTER — Encounter (HOSPITAL_COMMUNITY): Payer: Self-pay

## 2024-09-07 ENCOUNTER — Emergency Department (HOSPITAL_COMMUNITY)

## 2024-09-07 DIAGNOSIS — S0990XA Unspecified injury of head, initial encounter: Secondary | ICD-10-CM | POA: Diagnosis not present

## 2024-09-07 DIAGNOSIS — Z7401 Bed confinement status: Secondary | ICD-10-CM | POA: Diagnosis not present

## 2024-09-07 DIAGNOSIS — G309 Alzheimer's disease, unspecified: Secondary | ICD-10-CM | POA: Diagnosis not present

## 2024-09-07 DIAGNOSIS — S72142A Displaced intertrochanteric fracture of left femur, initial encounter for closed fracture: Principal | ICD-10-CM | POA: Diagnosis present

## 2024-09-07 DIAGNOSIS — E7849 Other hyperlipidemia: Secondary | ICD-10-CM | POA: Diagnosis present

## 2024-09-07 DIAGNOSIS — F0280 Dementia in other diseases classified elsewhere without behavioral disturbance: Secondary | ICD-10-CM | POA: Diagnosis present

## 2024-09-07 DIAGNOSIS — M6281 Muscle weakness (generalized): Secondary | ICD-10-CM | POA: Diagnosis not present

## 2024-09-07 DIAGNOSIS — E1159 Type 2 diabetes mellitus with other circulatory complications: Secondary | ICD-10-CM | POA: Diagnosis present

## 2024-09-07 DIAGNOSIS — J449 Chronic obstructive pulmonary disease, unspecified: Secondary | ICD-10-CM | POA: Diagnosis present

## 2024-09-07 DIAGNOSIS — R1111 Vomiting without nausea: Secondary | ICD-10-CM | POA: Diagnosis not present

## 2024-09-07 DIAGNOSIS — Z681 Body mass index (BMI) 19 or less, adult: Secondary | ICD-10-CM | POA: Diagnosis not present

## 2024-09-07 DIAGNOSIS — R2681 Unsteadiness on feet: Secondary | ICD-10-CM | POA: Diagnosis not present

## 2024-09-07 DIAGNOSIS — M25572 Pain in left ankle and joints of left foot: Secondary | ICD-10-CM | POA: Diagnosis not present

## 2024-09-07 DIAGNOSIS — Z885 Allergy status to narcotic agent status: Secondary | ICD-10-CM

## 2024-09-07 DIAGNOSIS — Z7984 Long term (current) use of oral hypoglycemic drugs: Secondary | ICD-10-CM | POA: Diagnosis not present

## 2024-09-07 DIAGNOSIS — E1169 Type 2 diabetes mellitus with other specified complication: Secondary | ICD-10-CM | POA: Diagnosis not present

## 2024-09-07 DIAGNOSIS — E7800 Pure hypercholesterolemia, unspecified: Secondary | ICD-10-CM | POA: Diagnosis present

## 2024-09-07 DIAGNOSIS — E43 Unspecified severe protein-calorie malnutrition: Secondary | ICD-10-CM | POA: Diagnosis present

## 2024-09-07 DIAGNOSIS — S72142D Displaced intertrochanteric fracture of left femur, subsequent encounter for closed fracture with routine healing: Secondary | ICD-10-CM | POA: Diagnosis not present

## 2024-09-07 DIAGNOSIS — I152 Hypertension secondary to endocrine disorders: Secondary | ICD-10-CM | POA: Diagnosis not present

## 2024-09-07 DIAGNOSIS — Z87891 Personal history of nicotine dependence: Secondary | ICD-10-CM

## 2024-09-07 DIAGNOSIS — D62 Acute posthemorrhagic anemia: Secondary | ICD-10-CM | POA: Diagnosis not present

## 2024-09-07 DIAGNOSIS — Z87442 Personal history of urinary calculi: Secondary | ICD-10-CM | POA: Diagnosis not present

## 2024-09-07 DIAGNOSIS — Z01818 Encounter for other preprocedural examination: Secondary | ICD-10-CM | POA: Diagnosis not present

## 2024-09-07 DIAGNOSIS — S9002XA Contusion of left ankle, initial encounter: Secondary | ICD-10-CM | POA: Diagnosis not present

## 2024-09-07 DIAGNOSIS — Z7982 Long term (current) use of aspirin: Secondary | ICD-10-CM

## 2024-09-07 DIAGNOSIS — I10 Essential (primary) hypertension: Secondary | ICD-10-CM | POA: Diagnosis not present

## 2024-09-07 DIAGNOSIS — Z751 Person awaiting admission to adequate facility elsewhere: Secondary | ICD-10-CM

## 2024-09-07 DIAGNOSIS — Z794 Long term (current) use of insulin: Secondary | ICD-10-CM | POA: Diagnosis not present

## 2024-09-07 DIAGNOSIS — W19XXXA Unspecified fall, initial encounter: Secondary | ICD-10-CM | POA: Diagnosis not present

## 2024-09-07 DIAGNOSIS — R41841 Cognitive communication deficit: Secondary | ICD-10-CM | POA: Diagnosis not present

## 2024-09-07 DIAGNOSIS — E119 Type 2 diabetes mellitus without complications: Secondary | ICD-10-CM

## 2024-09-07 DIAGNOSIS — S72102A Unspecified trochanteric fracture of left femur, initial encounter for closed fracture: Secondary | ICD-10-CM | POA: Diagnosis not present

## 2024-09-07 DIAGNOSIS — S7292XA Unspecified fracture of left femur, initial encounter for closed fracture: Secondary | ICD-10-CM | POA: Diagnosis not present

## 2024-09-07 DIAGNOSIS — M79672 Pain in left foot: Secondary | ICD-10-CM | POA: Diagnosis not present

## 2024-09-07 DIAGNOSIS — F0283 Dementia in other diseases classified elsewhere, unspecified severity, with mood disturbance: Secondary | ICD-10-CM | POA: Diagnosis present

## 2024-09-07 DIAGNOSIS — R918 Other nonspecific abnormal finding of lung field: Secondary | ICD-10-CM | POA: Diagnosis not present

## 2024-09-07 DIAGNOSIS — M6259 Muscle wasting and atrophy, not elsewhere classified, multiple sites: Secondary | ICD-10-CM | POA: Diagnosis not present

## 2024-09-07 DIAGNOSIS — R0609 Other forms of dyspnea: Secondary | ICD-10-CM | POA: Diagnosis not present

## 2024-09-07 DIAGNOSIS — R1311 Dysphagia, oral phase: Secondary | ICD-10-CM | POA: Diagnosis not present

## 2024-09-07 DIAGNOSIS — S2241XD Multiple fractures of ribs, right side, subsequent encounter for fracture with routine healing: Secondary | ICD-10-CM | POA: Diagnosis not present

## 2024-09-07 LAB — BASIC METABOLIC PANEL WITH GFR
Anion gap: 12 (ref 5–15)
BUN: 18 mg/dL (ref 8–23)
CO2: 28 mmol/L (ref 22–32)
Calcium: 9.4 mg/dL (ref 8.9–10.3)
Chloride: 100 mmol/L (ref 98–111)
Creatinine, Ser: 0.54 mg/dL (ref 0.44–1.00)
GFR, Estimated: >60 mL/min (ref >60)
Glucose, Bld: 271 mg/dL — ABNORMAL HIGH (ref 70–99)
Potassium: 3.8 mmol/L (ref 3.5–5.1)
Sodium: 141 mmol/L (ref 135–145)

## 2024-09-07 LAB — CBC WITH DIFFERENTIAL/PLATELET
Abs Immature Granulocytes: 0.16 K/uL — ABNORMAL HIGH (ref 0.00–0.07)
Basophils Absolute: 0.1 K/uL (ref 0.0–0.1)
Basophils Relative: 1 %
Eosinophils Absolute: 0.2 K/uL (ref 0.0–0.5)
Eosinophils Relative: 2 %
HCT: 42.3 % (ref 36.0–46.0)
Hemoglobin: 13.4 g/dL (ref 12.0–15.0)
Immature Granulocytes: 2 %
Lymphocytes Relative: 18 %
Lymphs Abs: 1.7 K/uL (ref 0.7–4.0)
MCH: 30.4 pg (ref 26.0–34.0)
MCHC: 31.7 g/dL (ref 30.0–36.0)
MCV: 95.9 fL (ref 80.0–100.0)
Monocytes Absolute: 0.9 K/uL (ref 0.1–1.0)
Monocytes Relative: 10 %
Neutro Abs: 6.4 K/uL (ref 1.7–7.7)
Neutrophils Relative %: 67 %
Platelets: 228 K/uL (ref 150–400)
RBC: 4.41 MIL/uL (ref 3.87–5.11)
RDW: 13.1 % (ref 11.5–15.5)
WBC: 9.4 K/uL (ref 4.0–10.5)
nRBC: 0 % (ref 0.0–0.2)

## 2024-09-07 MED ORDER — HYDROMORPHONE HCL 1 MG/ML IJ SOLN
0.3000 mg | Freq: Once | INTRAMUSCULAR | Status: AC
  Administered 2024-09-07: 0.3 mg via INTRAVENOUS
  Filled 2024-09-07: qty 1

## 2024-09-07 MED ORDER — INSULIN ASPART 100 UNIT/ML IJ SOLN
0.0000 [IU] | INTRAMUSCULAR | Status: DC
  Administered 2024-09-08: 5 [IU] via SUBCUTANEOUS
  Administered 2024-09-08: 2 [IU] via SUBCUTANEOUS
  Administered 2024-09-08: 5 [IU] via SUBCUTANEOUS
  Administered 2024-09-08: 2 [IU] via SUBCUTANEOUS
  Administered 2024-09-08: 9 [IU] via SUBCUTANEOUS
  Administered 2024-09-09: 2 [IU] via SUBCUTANEOUS
  Administered 2024-09-09 (×2): 5 [IU] via SUBCUTANEOUS
  Administered 2024-09-09: 2 [IU] via SUBCUTANEOUS
  Administered 2024-09-09 (×2): 5 [IU] via SUBCUTANEOUS
  Administered 2024-09-10 (×2): 2 [IU] via SUBCUTANEOUS
  Filled 2024-09-07: qty 0.09

## 2024-09-07 MED ORDER — DIVALPROEX SODIUM 125 MG PO DR TAB
125.0000 mg | DELAYED_RELEASE_TABLET | Freq: Two times a day (BID) | ORAL | Status: DC
  Administered 2024-09-08 – 2024-09-14 (×13): 125 mg via ORAL
  Filled 2024-09-07 (×14): qty 1

## 2024-09-07 MED ORDER — HYDROMORPHONE HCL 1 MG/ML IJ SOLN
0.3000 mg | Freq: Once | INTRAMUSCULAR | Status: AC | PRN
  Administered 2024-09-07: 0.3 mg via INTRAVENOUS
  Filled 2024-09-07: qty 1

## 2024-09-07 MED ORDER — ONDANSETRON HCL 4 MG/2ML IJ SOLN
4.0000 mg | Freq: Once | INTRAMUSCULAR | Status: AC
  Administered 2024-09-07: 4 mg via INTRAVENOUS
  Filled 2024-09-07: qty 2

## 2024-09-07 MED ORDER — DONEPEZIL HCL 23 MG PO TABS
23.0000 mg | ORAL_TABLET | Freq: Every day | ORAL | Status: DC
  Administered 2024-09-08 – 2024-09-13 (×6): 23 mg via ORAL
  Filled 2024-09-07 (×6): qty 1

## 2024-09-07 MED ORDER — FENTANYL CITRATE (PF) 50 MCG/ML IJ SOSY
25.0000 ug | PREFILLED_SYRINGE | Freq: Once | INTRAMUSCULAR | Status: AC
  Administered 2024-09-07: 25 ug via INTRAVENOUS
  Filled 2024-09-07: qty 1

## 2024-09-07 MED ORDER — ALBUTEROL SULFATE (2.5 MG/3ML) 0.083% IN NEBU: 2.5000 mg | INHALATION_SOLUTION | Freq: Four times a day (QID) | RESPIRATORY_TRACT | Status: DC | PRN

## 2024-09-07 MED ORDER — ACETAMINOPHEN 325 MG PO TABS: 650.0000 mg | ORAL_TABLET | Freq: Four times a day (QID) | ORAL | Status: DC | PRN

## 2024-09-07 MED ORDER — METOCLOPRAMIDE HCL 5 MG/ML IJ SOLN
5.0000 mg | Freq: Once | INTRAMUSCULAR | Status: AC
  Administered 2024-09-07: 5 mg via INTRAVENOUS
  Filled 2024-09-07: qty 2

## 2024-09-07 MED ORDER — MEMANTINE HCL 10 MG PO TABS
10.0000 mg | ORAL_TABLET | Freq: Two times a day (BID) | ORAL | Status: DC
  Administered 2024-09-08 – 2024-09-14 (×13): 10 mg via ORAL
  Filled 2024-09-07 (×13): qty 1

## 2024-09-07 MED ORDER — ATORVASTATIN CALCIUM 40 MG PO TABS
40.0000 mg | ORAL_TABLET | Freq: Every day | ORAL | Status: DC
Start: 2024-09-08 — End: 2024-09-14
  Administered 2024-09-08 – 2024-09-14 (×7): 40 mg via ORAL
  Filled 2024-09-07 (×7): qty 1

## 2024-09-07 MED ORDER — SENNOSIDES-DOCUSATE SODIUM 8.6-50 MG PO TABS: 1.0000 | ORAL_TABLET | Freq: Every evening | ORAL | Status: DC | PRN

## 2024-09-07 MED ORDER — HYDROMORPHONE HCL 1 MG/ML IJ SOLN
0.5000 mg | INTRAMUSCULAR | Status: DC | PRN
  Administered 2024-09-08 (×2): 0.5 mg via INTRAVENOUS
  Filled 2024-09-07 (×2): qty 0.5

## 2024-09-07 MED ORDER — IRBESARTAN 75 MG PO TABS
75.0000 mg | ORAL_TABLET | Freq: Every day | ORAL | Status: DC
Start: 2024-09-08 — End: 2024-09-11
  Administered 2024-09-08 – 2024-09-10 (×3): 75 mg via ORAL
  Filled 2024-09-07 (×3): qty 1

## 2024-09-07 NOTE — ED Triage Notes (Addendum)
 Pt arrives EMS with reports of unwitnessed fall from small step outside. Granddaughter reports pt went outside briefly and then was screaming. Unsure it pt hit head, pt has obvious deformity to left hip and denies other pain. Pt hx of dementia, oriented x2 per baseline. Pt had x3 episodes of emesis

## 2024-09-07 NOTE — H&P (Signed)
 History and Physical    Doris Wyatt FMW:[id number] DOB: [birth year] DOA: 09/07/2024  PCP: [name], MD  Patient coming from: Home  I have personally briefly reviewed patient's old medical records in [facility]  Chief Complaint: Left hip pain after fall  HPI: Doris Wyatt is a 78 y.o. female with medical history significant for dementia, COPD, T2DM, HTN, HLD, depression/anxiety who presented to the ED for evaluation of left hip pain after a fall.  History is limited from patient due to dementia and is otherwise supplemented by EDP, chart review, and family at bedside.  Patient lives at home with her family.  Normally she ambulates on her own.  Patient's daughter states that patient was walking in from the garage where there was a small step.  She had an unwitnessed fall and her daughter heard her screaming in pain.  She was complaining of left hip pain.  She was conscious the whole time and did not appear to hit her head.  She did have some bleeding from her left elbow area.  Family suspects she might of tripped or lost her balance since she was wearing slippers.  EMS were called and patient was brought to the ED for further evaluation.  Patient had 3 episodes of emesis on route to the ED.  Patient states she is still having some left hip pain otherwise no other complaints.  ED Course  Labs/Imaging on admission: I have personally reviewed following labs and imaging studies.  Initial vitals showed BP 174/73, pulse 64, RR 18, temp 97.9 F, SpO2 94% on room air.  Labs showed sodium 141, potassium 3.8, bicarb 28, BUN 18, creatinine 0.54, serum glucose 271, WBC 9.4, hemoglobin 13.4, platelets 228.  Pelvic and left femur x-ray showed an acute comminuted fracture of the intertrochanteric region of the left proximal femur with mild varus angulation and impaction.  CT head without contrast negative for acute intracranial normality.  CT cervical, lumbar, thoracic spine  without contrast negative for acute fracture or traumatic malalignment.  Patient was given IV fentanyl  and Dilaudid, IV Zofran .  EDP discussed with on-call orthopedics, Dr. Murrell, who recommended medical admission at [facility] with plan for surgical fixation tomorrow.  The hospitalist service was consulted for admission.  Review of Systems: All systems reviewed and are negative except as documented in history of present illness above.   Past Medical History:  Diagnosis Date   Abdominal pain, acute, left lower quadrant 10/31/2017   Arthritis    COPD (chronic obstructive pulmonary disease)    Diverticulitis of large intestine with perforation without abscess or bleeding    Generalized anxiety disorder    History of kidney stones    Hypercholesterolemia    Hypertension    Major depressive disorder    Major neurocognitive disorder due to Alzheimer's disease 07/01/2021   Osteoporosis    Pneumonia    hx of    Type II diabetes mellitus    Ureteral stone with hydronephrosis 10/31/2017   Urolithiasis     Past Surgical History:  Procedure Laterality Date   APPENDECTOMY     BACK SURGERY     x 2   CYSTOSCOPY W/ URETERAL STENT PLACEMENT Left 10/31/2017   Procedure: CYSTOSCOPY WITH RETROGRADE PYELOGRAM/LEFT URETERAL STENT PLACEMENT;  Surgeon: Watt Rush, MD;  Location: [facility];  Service: Urology;  Laterality: Left;   CYSTOSCOPY/URETEROSCOPY/HOLMIUM LASER/STENT PLACEMENT Left 11/09/2017   Procedure: CYSTOSCOPY LEFT URETEROSCOPY WITH HOLMIUM LASER STENT EXCHANGE, STONE BASKET RETRIVAL, STENT PLACEMENT;  Surgeon: Watt Rush, MD;  Location: [facility];  Service: Urology;  Laterality: Left;   TUBAL LIGATION      Social History: Social History   Tobacco Use   Smoking status: Former    Current packs/day: 0.00    Average packs/day: 3.0 packs/day for 40.0 years (120.0 ttl pk-yrs)    Types: Cigarettes    Start date: 10/31/1962    Quit date: 10/31/2002    Years since  quitting: 21.8   Smokeless tobacco: Never  Vaping Use   Vaping status: Never Used  Substance Use Topics   Alcohol use: Yes    Alcohol/week: 7.0 standard drinks of alcohol    Types: 7 Glasses of wine per week    Comment: nightly glass of wine   Drug use: No   Allergies  Allergen Reactions   Morphine  Sulfate Other (See Comments)   Oxycodone -Acetaminophen  Other (See Comments)   Sertraline Hcl Other (See Comments)    Family History  Problem Relation Age of Onset   Memory loss Mother      Prior to Admission medications   Medication Sig Start Date End Date Taking? Authorizing Provider  albuterol  (VENTOLIN  HFA) 108 (90 Base) MCG/ACT inhaler Inhale 2 puffs into the lungs every 6 (six) hours as needed for wheezing or shortness of breath.   Yes [provider]  aspirin EC 81 MG tablet Take 81 mg by mouth every evening. Swallow whole.   Yes [provider]  atorvastatin  (LIPITOR) 40 MG tablet Take 40 mg by mouth daily.   Yes [provider]  divalproex  (DEPAKOTE ) 125 MG DR tablet Take 1 tablet (125 mg total) by mouth 2 (two) times daily. 06/14/24  Yes Wertman, Sara E, PA-C  donepezil  (ARICEPT ) 23 MG TABS tablet TAKE 1 TABLET BY MOUTH EVERY DAY AT NIGHT 08/05/23  Yes Wertman, Sara E, PA-C  glimepiride (AMARYL) 4 MG tablet Take 4 mg by mouth daily with breakfast.   Yes [provider]  memantine  (NAMENDA ) 10 MG tablet TAKE 1 TABLET BY MOUTH TWICE A DAY 06/16/24  Yes Wertman, Sara E, PA-C  metFORMIN (GLUCOPHAGE-XR) 500 MG 24 hr tablet Take 500 mg by mouth every evening. 05/30/21  Yes [provider]  Multiple Vitamin (MULITIVITAMIN WITH MINERALS) TABS Take 1 tablet by mouth daily.   Yes [provider]  valsartan  (DIOVAN ) 80 MG tablet Take 80 mg by mouth daily.   Yes [provider]  acetaminophen  (TYLENOL ) 325 MG tablet Take 2 tablets (650 mg total) by mouth every 6 (six) hours as needed for mild pain (or Fever >/= 101). Patient not  taking: Reported on 07/21/2021 11/04/17   Dolan Mateo Larger, MD  lactobacillus (FLORANEX/LACTINEX) PACK Take 1 packet (1 g total) by mouth 3 (three) times daily with meals. Patient not taking: Reported on 07/21/2021 11/04/17   Dolan Mateo Larger, MD  ondansetron  (ZOFRAN  ODT) 4 MG disintegrating tablet Take 1 tablet (4 mg total) every 8 (eight) hours as needed by mouth for nausea or vomiting. Patient not taking: Reported on 11/16/2019 10/13/17   Charlyn Sora, MD  ondansetron  (ZOFRAN  ODT) 4 MG disintegrating tablet Take 1 tablet (4 mg total) by mouth every 8 (eight) hours as needed for nausea or vomiting. Patient not taking: Reported on 11/16/2019 11/10/17   Mackuen, Courteney Lyn, MD  oxybutynin  (DITROPAN ) 5 MG tablet Take 1 tablet (5 mg total) by mouth 3 (three) times daily. Patient not taking: Reported on 11/16/2019 11/04/17   Dolan Mateo Larger, MD  oxyCODONE -acetaminophen  (PERCOCET/ROXICET) 5-325 MG tablet Take 1 tablet by mouth every 6 (six) hours as needed for severe pain. Patient not taking: Reported on 11/16/2019 11/10/17   Mackuen, Courteney Lyn, MD  polyethylene glycol (MIRALAX  / GLYCOLAX ) packet Take 17 g by mouth daily. Patient not taking: Reported on 07/21/2021 11/05/17   Dolan Mateo Larger, MD    Physical Exam: Vitals:   09/07/24 1923 09/07/24 2300  BP: (!) 174/73 (!) 165/71  Pulse: 64 78  Resp: 18 18  Temp: 97.9 F (36.6 C)   TempSrc: Oral   SpO2: 94% 93%   Constitutional: Elderly woman resting supine in bed.  NAD, calm, comfortable Eyes: EOMI, lids and conjunctivae normal ENMT: Mucous membranes are moist. Posterior pharynx clear of any exudate or lesions.Normal dentition.  Neck: normal, supple, no masses. Respiratory: clear to auscultation anteriorly. Normal respiratory effort. No accessory muscle use.  Cardiovascular: Regular rate and rhythm, no murmurs / rubs / gallops. No extremity edema. 2+ pedal pulses. Abdomen: no tenderness, no masses  palpated. Musculoskeletal: ROM diminished LLE due to left hip fracture.  Left hip externally rotated, LLE shortened. Skin: no rashes, lesions, ulcers. No induration Neurologic: Sensation intact. Strength diminished LLE due to hip fracture. Psychiatric: Alert and oriented to self  EKG: Ordered and pending.  Assessment/Plan Principal Problem:   Closed comminuted intertrochanteric fracture of proximal femur, left, initial encounter (HCC) Active Problems:   Hypertension associated with diabetes (HCC)   COPD (chronic obstructive pulmonary disease)   Hyperlipidemia associated with type 2 diabetes mellitus (HCC)   Type 2 diabetes mellitus (HCC)   Major neurocognitive disorder due to Alzheimer's disease   Doris Wyatt is a 78 y.o. female with medical history significant for dementia, T2DM, HTN, HLD, depression/anxiety who is admitted with an acute left intertrochanteric proximal femur fracture.  Assessment and Plan: Acute left intertrochanteric proximal femur fracture: Occurring after fall at home.  Orthopedics to consult and determine timing of surgical fixation.  Will keep n.p.o. after midnight and continue analgesics as needed.  COPD: Stable without acute exacerbation.  Continue albuterol  nebulizer as needed.  Type 2 diabetes: Holding home meds.  Placed on SSI.  Hypertension: Continue valsartan .  Hyperlipidemia: Continue atorvastatin .  Dementia with mood disorder: Continue Aricept , Namenda , Depakote .  Delirium precautions.   DVT prophylaxis: SCDs Start: 09/07/24 2344 Code Status: Full code, discussed with family on admission Family Communication: Daughter, son-in-law, granddaughter at bedside Disposition Plan: From home, dispo pending clinical progress Consults called: Orthopedics Severity of Illness: The appropriate patient status for this patient is INPATIENT. Inpatient status is judged to be reasonable and necessary in order to provide the required intensity of  service to ensure the patient's safety. The patient's presenting symptoms, physical exam findings, and initial radiographic and laboratory data in the context of their chronic comorbidities is felt to place them at high risk for further clinical deterioration. Furthermore, it is not anticipated that the patient will be medically stable for discharge from the hospital within 2 midnights of admission.   * I certify that at the point of admission it is my clinical judgment that the patient will require inpatient hospital care spanning beyond 2 midnights from the point of admission due to high intensity of service, high risk for further deterioration and high frequency of surveillance required.DEWAINE Jorie Blanch MD [facility]  If 7PM-7AM, please contact night-coverage [email or web address]  09/07/2024, 11:52 PM

## 2024-09-07 NOTE — ED Provider Notes (Signed)
 [place] EMERGENCY DEPARTMENT AT [facility] Provider Note   CSN: [id number] Arrival date & time: 09/07/24  1912     History  Chief Complaint  Patient presents with   Fall   Hip Pain    Doris Wyatt is a 78 y.o. female with PMH as listed below who presents with left hip pain after fall at home.  Unwitnessed fall when patient was walking in the garage and stepped outside.  Patient is unsure what made her fall but denies losing consciousness.  Granddaughter reports that the patient went out into the garage briefly and then she heard her screaming.  Unsure if the patient in her head and she has dementia at baseline and cannot recall.  She has an obvious deformity of the left hip and is screaming in pain.  Oriented x 2 per baseline.  Patient had 3 episodes of emesis with EMS.  Patient denies any numbness or tingling.  Does not take any blood thinners.  Reports no head or neck, chest or abdominal pain.  Reports no back pain..    Past Medical History:  Diagnosis Date   Abdominal pain, acute, left lower quadrant 10/31/2017   Arthritis    COPD (chronic obstructive pulmonary disease)    Diverticulitis of large intestine with perforation without abscess or bleeding    Generalized anxiety disorder    History of kidney stones    Hypercholesterolemia    Hypertension    Major depressive disorder    Major neurocognitive disorder due to Alzheimer's disease 07/01/2021   Osteoporosis    Pneumonia    hx of    Type II diabetes mellitus    Ureteral stone with hydronephrosis 10/31/2017   Urolithiasis        Home Medications Prior to Admission medications   Medication Sig Start Date End Date Taking? Authorizing Provider  albuterol  (VENTOLIN  HFA) 108 (90 Base) MCG/ACT inhaler Inhale 2 puffs into the lungs every 6 (six) hours as needed for wheezing or shortness of breath.   Yes [provider]  aspirin EC 81 MG tablet Take 81 mg by mouth every evening. Swallow whole.    Yes [provider]  atorvastatin  (LIPITOR) 40 MG tablet Take 40 mg by mouth daily.   Yes [provider]  divalproex  (DEPAKOTE ) 125 MG DR tablet Take 1 tablet (125 mg total) by mouth 2 (two) times daily. 06/14/24  Yes Wertman, Sara E, PA-C  donepezil  (ARICEPT ) 23 MG TABS tablet TAKE 1 TABLET BY MOUTH EVERY DAY AT NIGHT 08/05/23  Yes Wertman, Sara E, PA-C  glimepiride (AMARYL) 4 MG tablet Take 4 mg by mouth daily with breakfast.   Yes [provider]  memantine  (NAMENDA ) 10 MG tablet TAKE 1 TABLET BY MOUTH TWICE A DAY 06/16/24  Yes Wertman, Sara E, PA-C  metFORMIN (GLUCOPHAGE-XR) 500 MG 24 hr tablet Take 500 mg by mouth every evening. 05/30/21  Yes [provider]  Multiple Vitamin (MULITIVITAMIN WITH MINERALS) TABS Take 1 tablet by mouth daily.   Yes [provider]  valsartan  (DIOVAN ) 80 MG tablet Take 80 mg by mouth daily.   Yes [provider]  acetaminophen  (TYLENOL ) 325 MG tablet Take 2 tablets (650 mg total) by mouth every 6 (six) hours as needed for mild pain (or Fever >/= 101). Patient not taking: Reported on 07/21/2021 11/04/17   Dolan Mateo Larger, MD  lactobacillus (FLORANEX/LACTINEX) PACK Take 1 packet (1 g total) by mouth 3 (three) times daily with meals. Patient not  taking: Reported on 07/21/2021 11/04/17   Dolan Mateo Larger, MD  ondansetron  (ZOFRAN  ODT) 4 MG disintegrating tablet Take 1 tablet (4 mg total) every 8 (eight) hours as needed by mouth for nausea or vomiting. Patient not taking: Reported on 11/16/2019 10/13/17   Charlyn Sora, MD  ondansetron  (ZOFRAN  ODT) 4 MG disintegrating tablet Take 1 tablet (4 mg total) by mouth every 8 (eight) hours as needed for nausea or vomiting. Patient not taking: Reported on 11/16/2019 11/10/17   Mackuen, Courteney Lyn, MD  oxybutynin  (DITROPAN ) 5 MG tablet Take 1 tablet (5 mg total) by mouth 3 (three) times daily. Patient not taking: Reported on 11/16/2019 11/04/17   Dolan Mateo Larger,  MD  oxyCODONE -acetaminophen  (PERCOCET/ROXICET) 5-325 MG tablet Take 1 tablet by mouth every 6 (six) hours as needed for severe pain. Patient not taking: Reported on 11/16/2019 11/10/17   Mackuen, Courteney Lyn, MD  polyethylene glycol (MIRALAX  / GLYCOLAX ) packet Take 17 g by mouth daily. Patient not taking: Reported on 07/21/2021 11/05/17   Dolan Mateo Larger, MD      Allergies    Morphine  sulfate, Oxycodone -acetaminophen , and Sertraline hcl    Review of Systems   Review of Systems A 10 point review of systems was performed and is negative unless otherwise reported in HPI.  Physical Exam Updated Vital Signs BP (!) 165/71   Pulse 78   Temp 97.9 F (36.6 C) (Oral)   Resp 18   SpO2 93%  Physical Exam General: Uncomfortable appearing elderly female, lying in bed.  HEENT: NCAT, PERRLA, Sclera anicteric, MMM, trachea midline.  Cardiology: RRR, no murmurs/rubs/gallops.  No chest wall tenderness palpation or crepitus. Resp: Normal respiratory rate and effort. CTAB, no wheezes, rhonchi, crackles.  Abd: Soft, non-tender, non-distended. No rebound tenderness or guarding.  Pelvis: Pelvis stable MSK: Deformity noted to proximal femur and left hip region with severe tenderness to palpation.  Left lower extremity is shortened and externally rotated.  Intact distal pulses and sensation.  Soft compartments.  No tenderness to the knee or lower leg. Skin: warm, dry.  Back:   No C T or L-spine tenderness palpation. Neuro: A&Ox4, CNs II-XII grossly intact. MAEs. Sensation grossly intact.  Psych: Normal mood and affect.   ED Results / Procedures / Treatments   Labs (all labs ordered are listed, but only abnormal results are displayed) Labs Reviewed  CBC WITH DIFFERENTIAL/PLATELET - Abnormal; Notable for the following components:      Result Value   Abs Immature Granulocytes 0.16 (*)    All other components within normal limits  BASIC METABOLIC PANEL WITH GFR - Abnormal; Notable for the  following components:   Glucose, Bld 271 (*)    All other components within normal limits    EKG None  Radiology CT Head Wo Contrast Result Date: 09/07/2024 EXAM: CT HEAD AND CERVICAL, THORACIC, AND LUMBAR SPINE 09/07/2024 10:36:15 PM TECHNIQUE: CT of the head and cervical, thoracic, and lumbar spine was performed without the administration of intravenous contrast. Multiplanar reformatted images are provided for review. Automated exposure control, iterative reconstruction, and/or weight based adjustment of the mA/kV was utilized to reduce the radiation dose to as low as reasonably achievable. COMPARISON: CT chest Apr 22, 2014 CLINICAL HISTORY: Head trauma, minor (Age >= 65y). Table formatting from the original note was not included. FINDINGS: CT HEAD BRAIN AND VENTRICLES: No acute intracranial hemorrhage. No mass effect or midline shift. No abnormal extra-axial fluid collection. No evidence of acute infarct. No hydrocephalus. ORBITS: No acute abnormality. SINUSES  AND MASTOIDS: No acute abnormality. SOFT TISSUES AND SKULL: No acute skull fracture. No acute soft tissue abnormality. CT CERVICAL, THORACIC, LUMBAR SPINE BONES AND ALIGNMENT: No acute fracture or traumatic malalignment. L4-L5 PLIF. DEGENERATIVE CHANGES: No significant degenerative changes. SOFT TISSUES: No prevertebral soft tissue swelling. Emphysema. Aortic atherosclerosis. IMPRESSION: 1. No acute intracranial abnormality. 2. No acute fracture or traumatic malalignment of the cervical, thoracic, or lumbar spine. Electronically signed by: Gilmore Molt MD 09/07/2024 10:50 PM EDT RP Workstation: [id number]   CT Cervical Spine Wo Contrast Result Date: 09/07/2024 EXAM: CT HEAD AND CERVICAL, THORACIC, AND LUMBAR SPINE 09/07/2024 10:36:15 PM TECHNIQUE: CT of the head and cervical, thoracic, and lumbar spine was performed without the administration of intravenous contrast. Multiplanar reformatted images are provided for review. Automated exposure  control, iterative reconstruction, and/or weight based adjustment of the mA/kV was utilized to reduce the radiation dose to as low as reasonably achievable. COMPARISON: CT chest Apr 22, 2014 CLINICAL HISTORY: Head trauma, minor (Age >= 65y). Table formatting from the original note was not included. FINDINGS: CT HEAD BRAIN AND VENTRICLES: No acute intracranial hemorrhage. No mass effect or midline shift. No abnormal extra-axial fluid collection. No evidence of acute infarct. No hydrocephalus. ORBITS: No acute abnormality. SINUSES AND MASTOIDS: No acute abnormality. SOFT TISSUES AND SKULL: No acute skull fracture. No acute soft tissue abnormality. CT CERVICAL, THORACIC, LUMBAR SPINE BONES AND ALIGNMENT: No acute fracture or traumatic malalignment. L4-L5 PLIF. DEGENERATIVE CHANGES: No significant degenerative changes. SOFT TISSUES: No prevertebral soft tissue swelling. Emphysema. Aortic atherosclerosis. IMPRESSION: 1. No acute intracranial abnormality. 2. No acute fracture or traumatic malalignment of the cervical, thoracic, or lumbar spine. Electronically signed by: Gilmore Molt MD 09/07/2024 10:50 PM EDT RP Workstation: [id number]   CT Lumbar Spine Wo Contrast Result Date: 09/07/2024 EXAM: CT HEAD AND CERVICAL, THORACIC, AND LUMBAR SPINE 09/07/2024 10:36:15 PM TECHNIQUE: CT of the head and cervical, thoracic, and lumbar spine was performed without the administration of intravenous contrast. Multiplanar reformatted images are provided for review. Automated exposure control, iterative reconstruction, and/or weight based adjustment of the mA/kV was utilized to reduce the radiation dose to as low as reasonably achievable. COMPARISON: CT chest Apr 22, 2014 CLINICAL HISTORY: Head trauma, minor (Age >= 65y). Table formatting from the original note was not included. FINDINGS: CT HEAD BRAIN AND VENTRICLES: No acute intracranial hemorrhage. No mass effect or midline shift. No abnormal extra-axial fluid collection. No  evidence of acute infarct. No hydrocephalus. ORBITS: No acute abnormality. SINUSES AND MASTOIDS: No acute abnormality. SOFT TISSUES AND SKULL: No acute skull fracture. No acute soft tissue abnormality. CT CERVICAL, THORACIC, LUMBAR SPINE BONES AND ALIGNMENT: No acute fracture or traumatic malalignment. L4-L5 PLIF. DEGENERATIVE CHANGES: No significant degenerative changes. SOFT TISSUES: No prevertebral soft tissue swelling. Emphysema. Aortic atherosclerosis. IMPRESSION: 1. No acute intracranial abnormality. 2. No acute fracture or traumatic malalignment of the cervical, thoracic, or lumbar spine. Electronically signed by: Gilmore Molt MD 09/07/2024 10:50 PM EDT RP Workstation: [id number]   CT Thoracic Spine Wo Contrast Result Date: 09/07/2024 EXAM: CT HEAD AND CERVICAL, THORACIC, AND LUMBAR SPINE 09/07/2024 10:36:15 PM TECHNIQUE: CT of the head and cervical, thoracic, and lumbar spine was performed without the administration of intravenous contrast. Multiplanar reformatted images are provided for review. Automated exposure control, iterative reconstruction, and/or weight based adjustment of the mA/kV was utilized to reduce the radiation dose to as low as reasonably achievable. COMPARISON: CT chest Apr 22, 2014 CLINICAL HISTORY: Head trauma, minor (Age >= 65y). Table formatting from the  original note was not included. FINDINGS: CT HEAD BRAIN AND VENTRICLES: No acute intracranial hemorrhage. No mass effect or midline shift. No abnormal extra-axial fluid collection. No evidence of acute infarct. No hydrocephalus. ORBITS: No acute abnormality. SINUSES AND MASTOIDS: No acute abnormality. SOFT TISSUES AND SKULL: No acute skull fracture. No acute soft tissue abnormality. CT CERVICAL, THORACIC, LUMBAR SPINE BONES AND ALIGNMENT: No acute fracture or traumatic malalignment. L4-L5 PLIF. DEGENERATIVE CHANGES: No significant degenerative changes. SOFT TISSUES: No prevertebral soft tissue swelling. Emphysema. Aortic  atherosclerosis. IMPRESSION: 1. No acute intracranial abnormality. 2. No acute fracture or traumatic malalignment of the cervical, thoracic, or lumbar spine. Electronically signed by: Gilmore Molt MD 09/07/2024 10:50 PM EDT RP Workstation: [id number]   DG Femur Min 2 Views Left Result Date: 09/07/2024 CLINICAL DATA:  Unwitnessed fall with deformity to the left hip. EXAM: LEFT FEMUR 2 VIEWS; PELVIS - 1-2 VIEW COMPARISON:  None Available. FINDINGS: There is an acute comminuted fracture of the inter trochanteric left proximal femur with mild varus angulation and impaction of the fracture fragments. No dislocation at the hip joint. Degenerative changes in the lower lumbar spine and in both hips. Pelvis appears intact. SI joints and symphysis pubis are not displaced. Postoperative changes in the lower lumbar spine. Vascular calcifications. Midshaft and distal femur appear intact. IMPRESSION: Acute comminuted fracture of the inter trochanteric region of the left proximal femur with mild varus angulation and impaction. Electronically Signed   By: Elsie Gravely M.D.   On: 09/07/2024 20:27   DG Pelvis 1-2 Views Result Date: 09/07/2024 CLINICAL DATA:  Unwitnessed fall with deformity to the left hip. EXAM: LEFT FEMUR 2 VIEWS; PELVIS - 1-2 VIEW COMPARISON:  None Available. FINDINGS: There is an acute comminuted fracture of the inter trochanteric left proximal femur with mild varus angulation and impaction of the fracture fragments. No dislocation at the hip joint. Degenerative changes in the lower lumbar spine and in both hips. Pelvis appears intact. SI joints and symphysis pubis are not displaced. Postoperative changes in the lower lumbar spine. Vascular calcifications. Midshaft and distal femur appear intact. IMPRESSION: Acute comminuted fracture of the inter trochanteric region of the left proximal femur with mild varus angulation and impaction. Electronically Signed   By: Elsie Gravely M.D.   On: 09/07/2024  20:27    Procedures Procedures    Medications Ordered in ED Medications  HYDROmorphone (DILAUDID) injection 0.3 mg (has no administration in time range)  metoCLOPramide (REGLAN) injection 5 mg (has no administration in time range)  ondansetron  (ZOFRAN ) injection 4 mg (4 mg Intravenous Given 09/07/24 1940)  fentaNYL  (SUBLIMAZE ) injection 25 mcg (25 mcg Intravenous Given 09/07/24 1944)  HYDROmorphone (DILAUDID) injection 0.3 mg (0.3 mg Intravenous Given 09/07/24 2052)    ED Course/ Medical Decision Making/ A&P                          Medical Decision Making Amount and/or Complexity of Data Reviewed Labs: ordered. Radiology: ordered. Decision-making details documented in ED Course.  Risk Prescription drug management. Decision regarding hospitalization.    This patient presents to the ED for concern of left hip pain and fall at home, this involves an extensive number of treatment options, and is a complaint that carries with it a high risk of complications and morbidity.  I considered the following differential and admission for this acute, potentially life threatening condition.  Patient is overall well-appearing and afebrile though hypertensive to 170s.  MDM:  For patient's hip pain and fall, consider hip dislocation vs fracture or femur fracture. Neurovascularly intact at this time with good distal pulses. Will obtain XRs to evaluate.  Fall was mechanical and patient reported no chest pain, lightheadedness, palpitations or shortness of breath prior to the fall.  Patient has no pain elsewhere.  Unsure if she hit her head, no obvious head trauma but patient does have dementia and has some repetitive questioning, will perform CT head and C-spine to rule out ICH.   Clinical Course as of 09/07/24 2306  Thu Sep 07, 2024  2041 DG Pelvis 1-2 Views Acute comminuted fracture of the inter trochanteric region of the left proximal femur with mild varus angulation and impaction.   [HN]   2046 D/w Dr. Murrell who will plan for her repair tomorrow at [facility] [HN]  2124 Patient began to complain of mid and lower back pain as well - added CT T and L spine [HN]  2254 CT Head Wo Contrast 1. No acute intracranial abnormality. 2. No acute fracture or traumatic malalignment of the cervical, thoracic, or lumbar spine.   [HN]    Clinical Course User Index [HN] Franklyn Sid SAILOR, MD    CT head and spine are negative for any acute findings.  Patient having some vomiting when she came back from CT.  Will dose IV Reglan, could be due to pain or the Dilaudid she was given.  Discussed with Dr. Tobie and will admit her to the hospitalist.   Labs: I Ordered, and personally interpreted labs.  The pertinent results include: Those listed above  Imaging Studies ordered: I ordered imaging studies including pelvis and hip x-rays, femur x-ray, CT head and C-spine I independently visualized and interpreted imaging. I agree with the radiologist interpretation  Additional history obtained from chart review, granddaughter at bedside  Reevaluation: After the interventions noted above, I reevaluated the patient and found that they have :improved  Social Determinants of Health:  lives independently  Disposition: Admit to medicine with Ortho following  Co morbidities that complicate the patient evaluation  Past Medical History:  Diagnosis Date   Abdominal pain, acute, left lower quadrant 10/31/2017   Arthritis    COPD (chronic obstructive pulmonary disease)    Diverticulitis of large intestine with perforation without abscess or bleeding    Generalized anxiety disorder    History of kidney stones    Hypercholesterolemia    Hypertension    Major depressive disorder    Major neurocognitive disorder due to Alzheimer's disease 07/01/2021   Osteoporosis    Pneumonia    hx of    Type II diabetes mellitus    Ureteral stone with hydronephrosis 10/31/2017   Urolithiasis      Medicines Meds  ordered this encounter  Medications   ondansetron  (ZOFRAN ) injection 4 mg   fentaNYL  (SUBLIMAZE ) injection 25 mcg   HYDROmorphone (DILAUDID) injection 0.3 mg   HYDROmorphone (DILAUDID) injection 0.3 mg   metoCLOPramide (REGLAN) injection 5 mg    I have reviewed the patients home medicines and have made adjustments as needed  Problem List / ED Course: Problem List Items Addressed This Visit   None Visit Diagnoses       Displaced intertrochanteric fracture of left femur, initial encounter for closed fracture (HCC)    -  Primary     Fall in home, initial encounter                       This  note was created using dictation software, which may contain spelling or grammatical errors.    Franklyn Sid SAILOR, MD 09/07/24 [phone]

## 2024-09-07 NOTE — Hospital Course (Signed)
 Doris Wyatt is a 78 y.o. female with medical history significant for dementia, T2DM, HTN, HLD, depression/anxiety who is admitted with an acute left intertrochanteric proximal femur fracture.

## 2024-09-08 ENCOUNTER — Encounter (HOSPITAL_COMMUNITY): Payer: Self-pay | Admitting: Internal Medicine

## 2024-09-08 ENCOUNTER — Inpatient Hospital Stay (HOSPITAL_COMMUNITY)

## 2024-09-08 ENCOUNTER — Encounter (HOSPITAL_COMMUNITY)
Admission: EM | Disposition: A | Discharge status: Skilled Nursing Facility | Payer: Self-pay | Source: Home / Self Care | Attending: Internal Medicine

## 2024-09-08 ENCOUNTER — Other Ambulatory Visit: Payer: Self-pay

## 2024-09-08 ENCOUNTER — Inpatient Hospital Stay (HOSPITAL_COMMUNITY): Admitting: Certified Registered Nurse Anesthetist

## 2024-09-08 DIAGNOSIS — E43 Unspecified severe protein-calorie malnutrition: Secondary | ICD-10-CM | POA: Insufficient documentation

## 2024-09-08 DIAGNOSIS — S72142A Displaced intertrochanteric fracture of left femur, initial encounter for closed fracture: Secondary | ICD-10-CM

## 2024-09-08 DIAGNOSIS — R0609 Other forms of dyspnea: Secondary | ICD-10-CM | POA: Diagnosis not present

## 2024-09-08 DIAGNOSIS — J449 Chronic obstructive pulmonary disease, unspecified: Secondary | ICD-10-CM | POA: Diagnosis not present

## 2024-09-08 DIAGNOSIS — Z87891 Personal history of nicotine dependence: Secondary | ICD-10-CM | POA: Diagnosis not present

## 2024-09-08 DIAGNOSIS — I10 Essential (primary) hypertension: Secondary | ICD-10-CM

## 2024-09-08 HISTORY — PX: FEMUR IM NAIL: SHX1597

## 2024-09-08 LAB — ECHOCARDIOGRAM COMPLETE
Area-P 1/2: 3.38 cm2
Height: 65 in
S' Lateral: 2.6 cm
Weight: 1562.62 [oz_av]

## 2024-09-08 LAB — CBC
HCT: 36.4 % (ref 36.0–46.0)
HCT: 38.5 % (ref 36.0–46.0)
Hemoglobin: 11.5 g/dL — ABNORMAL LOW (ref 12.0–15.0)
Hemoglobin: 12.5 g/dL (ref 12.0–15.0)
MCH: 30.6 pg (ref 26.0–34.0)
MCH: 30.6 pg (ref 26.0–34.0)
MCHC: 31.6 g/dL (ref 30.0–36.0)
MCHC: 32.5 g/dL (ref 30.0–36.0)
MCV: 94.4 fL (ref 80.0–100.0)
MCV: 96.8 fL (ref 80.0–100.0)
Platelets: 214 K/uL (ref 150–400)
Platelets: 231 K/uL (ref 150–400)
RBC: 3.76 MIL/uL — ABNORMAL LOW (ref 3.87–5.11)
RBC: 4.08 MIL/uL (ref 3.87–5.11)
RDW: 13 % (ref 11.5–15.5)
RDW: 13.2 % (ref 11.5–15.5)
WBC: 16.4 K/uL — ABNORMAL HIGH (ref 4.0–10.5)
WBC: 16.5 K/uL — ABNORMAL HIGH (ref 4.0–10.5)
nRBC: 0 % (ref 0.0–0.2)
nRBC: 0 % (ref 0.0–0.2)

## 2024-09-08 LAB — BASIC METABOLIC PANEL WITH GFR
Anion gap: 11 (ref 5–15)
BUN: 19 mg/dL (ref 8–23)
CO2: 29 mmol/L (ref 22–32)
Calcium: 8.9 mg/dL (ref 8.9–10.3)
Chloride: 101 mmol/L (ref 98–111)
Creatinine, Ser: 0.57 mg/dL (ref 0.44–1.00)
GFR, Estimated: >60 mL/min (ref >60)
Glucose, Bld: 379 mg/dL — ABNORMAL HIGH (ref 70–99)
Potassium: 4.5 mmol/L (ref 3.5–5.1)
Sodium: 140 mmol/L (ref 135–145)

## 2024-09-08 LAB — CREATININE, SERUM
Creatinine, Ser: 0.6 mg/dL (ref 0.44–1.00)
GFR, Estimated: >60 mL/min (ref >60)

## 2024-09-08 LAB — SURGICAL PCR SCREEN
MRSA, PCR: NEGATIVE
Staphylococcus aureus: NEGATIVE

## 2024-09-08 LAB — GLUCOSE, CAPILLARY
Glucose-Capillary: 360 mg/dL — ABNORMAL HIGH (ref 70–99)
Glucose-Capillary: 263 mg/dL — ABNORMAL HIGH (ref 70–99)
Glucose-Capillary: 182 mg/dL — ABNORMAL HIGH (ref 70–99)
Glucose-Capillary: 173 mg/dL — ABNORMAL HIGH (ref 70–99)
Glucose-Capillary: 257 mg/dL — ABNORMAL HIGH (ref 70–99)

## 2024-09-08 LAB — PROTIME-INR
INR: 1.1 (ref 0.8–1.2)
Prothrombin Time: 14.7 s (ref 11.4–15.2)

## 2024-09-08 LAB — PREPARE RBC (CROSSMATCH)

## 2024-09-08 LAB — ABO/RH: ABO/RH(D): A NEG

## 2024-09-08 SURGERY — INSERTION, INTRAMEDULLARY ROD, FEMUR
Anesthesia: General | Laterality: Left

## 2024-09-08 MED ORDER — PHENYLEPHRINE HCL (PRESSORS) 10 MG/ML IV SOLN
INTRAVENOUS | Status: DC | PRN
Start: 2024-09-08 — End: 2024-09-08
  Administered 2024-09-08 (×5): 80 ug via INTRAVENOUS

## 2024-09-08 MED ORDER — CEFAZOLIN SODIUM-DEXTROSE 2-4 GM/100ML-% IV SOLN
2.0000 g | Freq: Four times a day (QID) | INTRAVENOUS | Status: AC
  Administered 2024-09-08 – 2024-09-09 (×3): 2 g via INTRAVENOUS
  Filled 2024-09-08 (×3): qty 100

## 2024-09-08 MED ORDER — INSULIN ASPART 100 UNIT/ML IJ SOLN: 0.0000 [IU] | INTRAMUSCULAR | Status: DC | PRN

## 2024-09-08 MED ORDER — ADULT MULTIVITAMIN W/MINERALS CH
1.0000 | ORAL_TABLET | Freq: Every day | ORAL | Status: DC
  Administered 2024-09-09 – 2024-09-14 (×6): 1 via ORAL
  Filled 2024-09-08 (×6): qty 1

## 2024-09-08 MED ORDER — ONDANSETRON HCL 4 MG/2ML IJ SOLN
INTRAMUSCULAR | Status: AC
  Filled 2024-09-08: qty 2

## 2024-09-08 MED ORDER — POLYETHYLENE GLYCOL 3350 17 G PO PACK
17.0000 g | PACK | Freq: Two times a day (BID) | ORAL | Status: AC
  Administered 2024-09-08 – 2024-09-09 (×3): 17 g via ORAL
  Filled 2024-09-08 (×3): qty 1

## 2024-09-08 MED ORDER — LIDOCAINE HCL (PF) 2 % IJ SOLN
INTRAMUSCULAR | Status: AC
  Filled 2024-09-08: qty 5

## 2024-09-08 MED ORDER — ACETAMINOPHEN 10 MG/ML IV SOLN: 1000.0000 mg | Freq: Once | INTRAVENOUS | Status: DC | PRN

## 2024-09-08 MED ORDER — 0.9 % SODIUM CHLORIDE (POUR BTL) OPTIME
TOPICAL | Status: DC | PRN
  Administered 2024-09-08: 1000 mL

## 2024-09-08 MED ORDER — PROPOFOL 10 MG/ML IV BOLUS
INTRAVENOUS | Status: DC | PRN
  Administered 2024-09-08: 30 mg via INTRAVENOUS
  Administered 2024-09-08: 20 mg via INTRAVENOUS
  Administered 2024-09-08: 80 mg via INTRAVENOUS

## 2024-09-08 MED ORDER — TRANEXAMIC ACID-NACL 1000-0.7 MG/100ML-% IV SOLN
1000.0000 mg | INTRAVENOUS | Status: AC
  Administered 2024-09-08: 1000 mg via INTRAVENOUS

## 2024-09-08 MED ORDER — ACETAMINOPHEN 325 MG PO TABS
325.0000 mg | ORAL_TABLET | Freq: Four times a day (QID) | ORAL | Status: DC | PRN
  Administered 2024-09-12: 325 mg via ORAL
  Administered 2024-09-13: 650 mg via ORAL
  Administered 2024-09-13: 325 mg via ORAL
  Filled 2024-09-08: qty 1
  Filled 2024-09-08 (×2): qty 2

## 2024-09-08 MED ORDER — LACTATED RINGERS IV SOLN: INTRAVENOUS | Status: DC | PRN

## 2024-09-08 MED ORDER — ROCURONIUM BROMIDE 10 MG/ML (PF) SYRINGE
PREFILLED_SYRINGE | INTRAVENOUS | Status: DC | PRN
  Administered 2024-09-08: 50 mg via INTRAVENOUS

## 2024-09-08 MED ORDER — ONDANSETRON HCL 4 MG/2ML IJ SOLN
4.0000 mg | Freq: Four times a day (QID) | INTRAMUSCULAR | Status: DC | PRN
  Administered 2024-09-08 – 2024-09-11 (×4): 4 mg via INTRAVENOUS
  Filled 2024-09-08 (×5): qty 2

## 2024-09-08 MED ORDER — MELATONIN 3 MG PO TABS
3.0000 mg | ORAL_TABLET | Freq: Every day | ORAL | Status: DC
  Administered 2024-09-08 – 2024-09-09 (×2): 3 mg via ORAL
  Filled 2024-09-08 (×2): qty 1

## 2024-09-08 MED ORDER — FENTANYL CITRATE (PF) 100 MCG/2ML IJ SOLN
INTRAMUSCULAR | Status: AC
  Filled 2024-09-08: qty 2

## 2024-09-08 MED ORDER — ONDANSETRON HCL 4 MG/2ML IJ SOLN: 4.0000 mg | Freq: Once | INTRAMUSCULAR | Status: DC | PRN

## 2024-09-08 MED ORDER — HYDROCODONE-ACETAMINOPHEN 10-325 MG PO TABS: 1.0000 | ORAL_TABLET | Freq: Four times a day (QID) | ORAL | Status: DC | PRN

## 2024-09-08 MED ORDER — DEXAMETHASONE SOD PHOSPHATE PF 10 MG/ML IJ SOLN
INTRAMUSCULAR | Status: DC | PRN
  Administered 2024-09-08: 4 mg via INTRAVENOUS

## 2024-09-08 MED ORDER — PHENYLEPHRINE HCL (PRESSORS) 10 MG/ML IV SOLN
INTRAVENOUS | Status: AC
  Filled 2024-09-08: qty 1

## 2024-09-08 MED ORDER — BISACODYL 10 MG RE SUPP
10.0000 mg | Freq: Every day | RECTAL | Status: DC | PRN
  Administered 2024-09-14: 10 mg via RECTAL
  Filled 2024-09-08: qty 1

## 2024-09-08 MED ORDER — CEFAZOLIN SODIUM-DEXTROSE 2-4 GM/100ML-% IV SOLN
2.0000 g | INTRAVENOUS | Status: AC
  Administered 2024-09-08: 2 g via INTRAVENOUS

## 2024-09-08 MED ORDER — SODIUM CHLORIDE 0.9 % IV SOLN: 10.0000 mL/h | Freq: Once | INTRAVENOUS | Status: DC

## 2024-09-08 MED ORDER — ENOXAPARIN SODIUM 30 MG/0.3ML IJ SOSY
30.0000 mg | PREFILLED_SYRINGE | INTRAMUSCULAR | Status: DC
  Administered 2024-09-09 – 2024-09-14 (×6): 30 mg via SUBCUTANEOUS
  Filled 2024-09-08 (×6): qty 0.3

## 2024-09-08 MED ORDER — LIDOCAINE 2% (20 MG/ML) 5 ML SYRINGE
INTRAMUSCULAR | Status: DC | PRN
  Administered 2024-09-08: 40 mg via INTRAVENOUS

## 2024-09-08 MED ORDER — FENTANYL CITRATE (PF) 100 MCG/2ML IJ SOLN
INTRAMUSCULAR | Status: DC | PRN
  Administered 2024-09-08 (×2): 25 ug via INTRAVENOUS

## 2024-09-08 MED ORDER — FLEET ENEMA RE ENEM: 1.0000 | ENEMA | Freq: Once | RECTAL | Status: DC | PRN

## 2024-09-08 MED ORDER — SODIUM CHLORIDE 0.9 % IV SOLN: INTRAVENOUS | Status: AC

## 2024-09-08 MED ORDER — SUGAMMADEX SODIUM 200 MG/2ML IV SOLN
INTRAVENOUS | Status: DC | PRN
  Administered 2024-09-08: 200 mg via INTRAVENOUS

## 2024-09-08 MED ORDER — FENTANYL CITRATE (PF) 50 MCG/ML IJ SOSY
PREFILLED_SYRINGE | INTRAMUSCULAR | Status: AC
  Filled 2024-09-08: qty 1

## 2024-09-08 MED ORDER — ACETAMINOPHEN 500 MG PO TABS
1000.0000 mg | ORAL_TABLET | Freq: Four times a day (QID) | ORAL | Status: AC
  Administered 2024-09-08 – 2024-09-09 (×3): 1000 mg via ORAL
  Filled 2024-09-08 (×3): qty 2

## 2024-09-08 MED ORDER — CEFAZOLIN SODIUM-DEXTROSE 2-4 GM/100ML-% IV SOLN
INTRAVENOUS | Status: AC
  Filled 2024-09-08: qty 100

## 2024-09-08 MED ORDER — ONDANSETRON HCL 4 MG PO TABS
4.0000 mg | ORAL_TABLET | Freq: Four times a day (QID) | ORAL | Status: DC | PRN
  Administered 2024-09-13 (×2): 4 mg via ORAL
  Filled 2024-09-08 (×2): qty 1

## 2024-09-08 MED ORDER — GLUCERNA SHAKE PO LIQD
237.0000 mL | Freq: Two times a day (BID) | ORAL | Status: DC
Start: 2024-09-09 — End: 2024-09-14
  Administered 2024-09-09 – 2024-09-13 (×8): 237 mL via ORAL
  Filled 2024-09-08 (×12): qty 237

## 2024-09-08 MED ORDER — FENTANYL CITRATE (PF) 50 MCG/ML IJ SOSY
25.0000 ug | PREFILLED_SYRINGE | INTRAMUSCULAR | Status: DC | PRN
  Administered 2024-09-08 (×2): 25 ug via INTRAVENOUS

## 2024-09-08 MED ORDER — ONDANSETRON HCL 4 MG/2ML IJ SOLN
INTRAMUSCULAR | Status: DC | PRN
  Administered 2024-09-08: 4 mg via INTRAVENOUS

## 2024-09-08 MED ORDER — DOCUSATE SODIUM 100 MG PO CAPS
100.0000 mg | ORAL_CAPSULE | Freq: Two times a day (BID) | ORAL | Status: DC
  Administered 2024-09-08 – 2024-09-14 (×12): 100 mg via ORAL
  Filled 2024-09-08 (×11): qty 1

## 2024-09-08 MED ORDER — TRANEXAMIC ACID-NACL 1000-0.7 MG/100ML-% IV SOLN
INTRAVENOUS | Status: AC
  Filled 2024-09-08: qty 100

## 2024-09-08 MED ORDER — HYDROMORPHONE HCL 1 MG/ML IJ SOLN
0.5000 mg | INTRAMUSCULAR | Status: DC | PRN
  Administered 2024-09-08: 1 mg via INTRAVENOUS
  Administered 2024-09-09 – 2024-09-10 (×3): 0.5 mg via INTRAVENOUS
  Administered 2024-09-10: 1 mg via INTRAVENOUS
  Administered 2024-09-10 – 2024-09-14 (×4): 0.5 mg via INTRAVENOUS
  Filled 2024-09-08 (×9): qty 1

## 2024-09-08 MED ORDER — POVIDONE-IODINE 10 % EX SWAB: 2.0000 | Freq: Once | CUTANEOUS | Status: DC

## 2024-09-08 MED ORDER — CHLORHEXIDINE GLUCONATE 4 % EX SOLN: 60.0000 mL | Freq: Once | CUTANEOUS | Status: DC

## 2024-09-08 MED ORDER — HYDROCODONE-ACETAMINOPHEN 5-325 MG PO TABS
1.0000 | ORAL_TABLET | Freq: Four times a day (QID) | ORAL | Status: DC | PRN
  Administered 2024-09-08 – 2024-09-14 (×15): 1 via ORAL
  Filled 2024-09-08 (×16): qty 1

## 2024-09-08 MED ORDER — SODIUM CHLORIDE 0.9 % IV SOLN: INTRAVENOUS | Status: DC

## 2024-09-08 MED ORDER — SUGAMMADEX SODIUM 200 MG/2ML IV SOLN
INTRAVENOUS | Status: AC
  Filled 2024-09-08: qty 2

## 2024-09-08 MED ORDER — DROPERIDOL 2.5 MG/ML IJ SOLN: 0.6250 mg | Freq: Once | INTRAMUSCULAR | Status: DC | PRN

## 2024-09-08 SURGICAL SUPPLY — 35 items
ALCOHOL 70% 16 OZ (MISCELLANEOUS) ×2 IMPLANT
BAG COUNTER SPONGE SURGICOUNT (BAG) ×2 IMPLANT
BLADE SURG SZ10 CARB STEEL (BLADE) ×2 IMPLANT
BNDG COHESIVE 4X5 TAN STRL LF (GAUZE/BANDAGES/DRESSINGS) ×2 IMPLANT
BNDG COHESIVE 6X5 TAN ST LF (GAUZE/BANDAGES/DRESSINGS) ×2 IMPLANT
CHLORAPREP W/TINT 26 (MISCELLANEOUS) ×2 IMPLANT
COVER BACK TABLE 60X90IN (DRAPES) ×2 IMPLANT
COVER PERINEAL POST (MISCELLANEOUS) ×2 IMPLANT
COVER SURGICAL LIGHT HANDLE (MISCELLANEOUS) ×2 IMPLANT
DRAPE STERI IOBAN 125X83 (DRAPES) ×2 IMPLANT
DRESSING MEPILEX FLEX 4X4 (GAUZE/BANDAGES/DRESSINGS) ×6 IMPLANT
GAUZE SPONGE 4X4 12PLY STRL (GAUZE/BANDAGES/DRESSINGS) IMPLANT
GLOVE SURG SYN 8.0 PF PI (GLOVE) ×4 IMPLANT
GOWN STRL REUS W/ TWL XL LVL3 (GOWN DISPOSABLE) IMPLANT
GOWN STRL REUS W/TWL LRG LVL3 (GOWN DISPOSABLE) ×2 IMPLANT
GOWN STRL REUS W/TWL XL LVL3 (GOWN DISPOSABLE) ×2 IMPLANT
GUIDEPIN VERSANAIL DSP 3.2X444 (ORTHOPEDIC DISPOSABLE SUPPLIES) IMPLANT
GUIDEWIRE BALL NOSE 100CM (WIRE) IMPLANT
KIT BASIN OR (CUSTOM PROCEDURE TRAY) ×2 IMPLANT
KIT TURNOVER KIT A (KITS) IMPLANT
KIT TURNOVER KIT B (KITS) ×2 IMPLANT
NAIL IM AF HIP 11X400 125D LT (Nail) IMPLANT
PAD ARMBOARD POSITIONER FOAM (MISCELLANEOUS) ×6 IMPLANT
SCREW LAG HIP NAIL 10.5X95 (Screw) IMPLANT
SOLN 0.9% NACL 1000 ML (IV SOLUTION) IMPLANT
SOLN 0.9% NACL POUR BTL 1000ML (IV SOLUTION) ×2 IMPLANT
SOLN STERILE WATER 1000 ML (IV SOLUTION) IMPLANT
SOLN STERILE WATER BTL 1000 ML (IV SOLUTION) ×2 IMPLANT
SPONGE T-LAP 18X18 ~~LOC~~+RFID (SPONGE) ×2 IMPLANT
STAPLER SKIN PROX 35W (STAPLE) ×2 IMPLANT
SUT VIC AB 1 CTX36XBRD ANBCTRL (SUTURE) ×2 IMPLANT
SUT VIC AB 2-0 SH 27X BRD (SUTURE) ×2 IMPLANT
TOWEL GREEN STERILE (TOWEL DISPOSABLE) ×2 IMPLANT
TOWEL GREEN STERILE FF (TOWEL DISPOSABLE) IMPLANT
TRAY FOL W/BAG SLVR 16FR STRL (SET/KITS/TRAYS/PACK) IMPLANT

## 2024-09-08 NOTE — Progress Notes (Signed)
 SLP Cancellation Note  Patient Details Name: Doris Wyatt MRN: [id number] DOB: Oct 12, 1946   Cancelled treatment:       Reason Eval/Treat Not Completed: Orders received for SLE. Pt with history of dementia at baseline. Possible surgical intervention today. Will continue efforts.  Brogen Duell B. Dory, MSP, CCC-SLP Speech Language Pathologist  Dory Caprice Daring 09/08/2024, 1:10 PM

## 2024-09-08 NOTE — Anesthesia Postprocedure Evaluation (Signed)
 Anesthesia Post Note  Patient: Doris Wyatt  Procedure(s) Performed: INSERTION, INTRAMEDULLARY ROD, FEMUR (Left)     Patient location during evaluation: PACU Anesthesia Type: General Level of consciousness: awake and alert Pain management: pain level controlled Vital Signs Assessment: post-procedure vital signs reviewed and stable Respiratory status: spontaneous breathing, nonlabored ventilation, respiratory function stable and patient connected to nasal cannula oxygen Cardiovascular status: blood pressure returned to baseline and stable Postop Assessment: no apparent nausea or vomiting Anesthetic complications: no   No notable events documented.  Last Vitals:  Vitals:   09/08/24 1730 09/08/24 1819  BP: (!) 152/80 (!) 150/76  Pulse: 89 77  Resp: (!) 27 18  Temp:  37.4 C  SpO2: 94% 91%    Last Pain:  Vitals:   09/08/24 1819  TempSrc: Oral  PainSc:                  Thom JONELLE Peoples

## 2024-09-08 NOTE — Consult Note (Signed)
 Orthopedic Consult  Patient ID: Doris Wyatt MRN: [id number] DOB/AGE: 80/12/1945 80 y.o.  Reason for Consult: Left hip fracture Referring Physician: Odell Castor  HPI: Doris Wyatt Card is an 78 y.o. female who fell yesterday.  She was walking into the house when she slipped on the steps from loose slippers.  She is independent ambulator.  She does have some degree of dementia.  She lives with her daughter.  The patient is somewhat confused and so the history was obtained from the daughter as well as her granddaughter.  According to the family, the patient had no antecedent hip pain.  They deny any other injuries or also fall.  While she is somewhat confused mentation is not changed from baseline.  Past Medical History:  Diagnosis Date   Abdominal pain, acute, left lower quadrant 10/31/2017   Arthritis    COPD (chronic obstructive pulmonary disease)    Diverticulitis of large intestine with perforation without abscess or bleeding    Generalized anxiety disorder    History of kidney stones    Hypercholesterolemia    Hypertension    Major depressive disorder    Major neurocognitive disorder due to Alzheimer's disease 07/01/2021   Osteoporosis    Pneumonia    hx of    Type II diabetes mellitus    Ureteral stone with hydronephrosis 10/31/2017   Urolithiasis     Past Surgical History:  Procedure Laterality Date   APPENDECTOMY     BACK SURGERY     x 2   CYSTOSCOPY W/ URETERAL STENT PLACEMENT Left 10/31/2017   Procedure: CYSTOSCOPY WITH RETROGRADE PYELOGRAM/LEFT URETERAL STENT PLACEMENT;  Surgeon: Watt Rush, MD;  Location: [facility];  Service: Urology;  Laterality: Left;   CYSTOSCOPY/URETEROSCOPY/HOLMIUM LASER/STENT PLACEMENT Left 11/09/2017   Procedure: CYSTOSCOPY LEFT URETEROSCOPY WITH HOLMIUM LASER STENT EXCHANGE, STONE BASKET RETRIVAL, STENT PLACEMENT;  Surgeon: Watt Rush, MD;  Location: [facility];  Service: Urology;  Laterality: Left;   TUBAL LIGATION       Family History  Problem Relation Age of Onset   Memory loss Mother     Social History:  reports that she quit smoking about 21 years ago. Her smoking use included cigarettes. She started smoking about 61 years ago. She has a 120 pack-year smoking history. She has never used smokeless tobacco. She reports current alcohol use of about 7.0 standard drinks of alcohol per week. She reports that she does not use drugs.  Allergies:  Allergies  Allergen Reactions   Morphine  Sulfate Other (See Comments)   Oxycodone -Acetaminophen  Other (See Comments)   Sertraline Hcl Other (See Comments)    Medications: I have reviewed the patient's current medications.  ROS: Constitutional: No fever or chills Vision: No changes in vision ENT: No difficulty swallowing CV: No chest pain Pulm: No SOB or wheezing GI: No nausea or vomiting GU: No urgency or inability to hold urine Skin: No poor wound healing Neurologic: No numbness or tingling Psychiatric: No depression or anxiety Heme: No bruising Allergic: No reaction to medications or food   Exam: Blood pressure 124/62, pulse 86, temperature 99.9 F (37.7 C), temperature source Oral, resp. rate 16, height 5' 5 (1.651 m), weight 44.3 kg, SpO2 98%. General: Well-appearing woman in no acute distress.  She is poorly communicative but does comply with exam Orientation: Alert but not oriented Mood and Affect: Calm   Injured Extremity (CV, lymph, sensation, reflexes): Left leg is shortened and externally rotated.  No apparent tenderness about the knee or ankle.  Good capillary refill.  Grossly neurologically intact.  No tenderness palpation crepitus defects or deformities of the bilateral upper extremities or right lower extremity     Medical Decision Making: Data: Imaging: AP pelvis and views of the left hip reveal left intertrochanteric hip fracture  Labs: White cell count 16.5.  Hemoglobin 12.5.  Hematocrit 38.5.Platelets 214.  Imaging or  Labs ordered: Will order INR  Medical history and chart was reviewed and case discussed with medical provider.  Assessment/Plan: Left trochanteric hip fracture  The patient does have a left intra contact hip fracture.  This require surgical stabilization.  We discussed the goals of surgery to stabilize the fracture, allow for healing, and allow for early mobilization.  We hope to avoid the morbidity associated with prolonged immobilization including bedsores pneumonias and blood clots.  We discussed the risks of surgery include but not limited to bleeding, infection, injury to nerves or tendons, nonunion, malunion, hardware failure, risk of anesthesia, risk of blood clots.  Naproxen  was obtained from the patient's family.  Will plan for surgery be done later on today.  We discussed the expected recovery time.  New problem w/ workup planned: High complexity diagnosis (Level 5) Surgery w/ risks or Emergency surgery: High complexity Risk (Level 5)  All others are Level 4 with comprehensive musculoskeletal exam.  Doris Rhein, MD, MS [facility] [phone]

## 2024-09-08 NOTE — Anesthesia Preprocedure Evaluation (Addendum)
 Anesthesia Evaluation  Patient identified by MRN, date of birth, ID band  Reviewed: Allergy & Precautions, NPO status , Patient's Chart, lab work & pertinent test results  History of Anesthesia Complications Negative for: history of anesthetic complications  Airway Mallampati: III  TM Distance: >3 FB Neck ROM: Full    Dental  (+) Missing, Edentulous Lower, Dental Advisory Given   Pulmonary COPD, former smoker   breath sounds clear to auscultation       Cardiovascular hypertension,  Rhythm:Regular Rate:Tachycardia     Neuro/Psych       Dementia    GI/Hepatic   Endo/Other  diabetes    Renal/GU      Musculoskeletal   Abdominal   Peds  Hematology   Anesthesia Other Findings   Reproductive/Obstetrics                              Anesthesia Physical Anesthesia Plan  ASA: 3  Anesthesia Plan: General   Post-op Pain Management:    Induction: Intravenous and Rapid sequence  PONV Risk Score and Plan: 1 and TIVA  Airway Management Planned: Oral ETT  Additional Equipment:   Intra-op Plan:   Post-operative Plan: Extubation in OR  Informed Consent:      Dental advisory given  Plan Discussed with:   Anesthesia Plan Comments: (78 y.o. female with medical history significant for dementia, COPD, T2DM, HTN, HLD, depression/anxiety who presented to the ED for evaluation of left hip pain after a fall.)         Anesthesia Quick Evaluation

## 2024-09-08 NOTE — Interval H&P Note (Signed)
 History and Physical Interval Note:  09/08/2024 3:31 PM  Doris Wyatt  has presented today for surgery, with the diagnosis of Intertrochanteric fracture.  The various methods of treatment have been discussed with the patient and family. After consideration of risks, benefits and other options for treatment, the patient has consented to  Procedure(s): INSERTION, INTRAMEDULLARY ROD, FEMUR (Left) as a surgical intervention.  The patient's history has been reviewed, patient examined, no change in status, stable for surgery.  I have reviewed the patient's chart and labs.  Questions were answered to the patient's satisfaction.     Doris Wyatt

## 2024-09-08 NOTE — Discharge Instructions (Signed)
 Orthopaedic Discharge Instructions   General Discharge Instructions  WEIGHT BEARING STATUS: Weightbearing as tolerated  RANGE OF MOTION/ACTIVITY: No restriction  Wound Care: You may remove your surgical dressing on 09/10/2024, Incisions can be left open to air if there is no drainage. Once the incision is completely dry and without drainage, it may be left open to air out.  Showering may begin 09/10/2024, Clean incision gently with soap and water.  DVT/PE prophylaxis: Lovenox for 4 weeks after surgery  Diet: as you were eating previously.  Can use over the counter stool softeners and bowel preparations, such as Miralax , to help with bowel movements.  Narcotics can be constipating.  Be sure to drink plenty of fluids  PAIN MEDICATION USE AND EXPECTATIONS  You have likely been given narcotic medications to help control your pain.  After a traumatic event that results in an fracture (broken bone) with or without surgery, it is ok to use narcotic pain medications to help control one's pain.  We understand that everyone responds to pain differently and each individual patient will be evaluated on a regular basis for the continued need for narcotic medications. Ideally, narcotic medication use should last no more than 6-8 weeks (coinciding with fracture healing).   As a patient it is your responsibility as well to monitor narcotic medication use and report the amount and frequency you use these medications when you come to your office visit.   We would also advise that if you are using narcotic medications, you should take a dose prior to therapy to maximize you participation.  IF YOU ARE ON NARCOTIC MEDICATIONS IT IS NOT PERMISSIBLE TO OPERATE A MOTOR VEHICLE (MOTORCYCLE/CAR/TRUCK/MOPED) OR HEAVY MACHINERY DO NOT MIX NARCOTICS WITH OTHER CNS (CENTRAL NERVOUS SYSTEM) DEPRESSANTS SUCH AS ALCOHOL  POST-OPERATIVE OPIOID TAPER INSTRUCTIONS: It is important to wean off of your opioid medication as soon  as possible. If you do not need pain medication after your surgery it is ok to stop day one. Opioids include: Codeine, Hydrocodone (Norco, Vicodin), Oxycodone (Percocet, oxycontin ) and hydromorphone amongst others.  Long term and even short term use of opiods can cause: Increased pain response Dependence Constipation Depression Respiratory depression And more.  Withdrawal symptoms can include Flu like symptoms Nausea, vomiting And more Techniques to manage these symptoms Hydrate well Eat regular healthy meals Stay active Use relaxation techniques(deep breathing, meditating, yoga) Do Not substitute Alcohol to help with tapering If you have been on opioids for less than two weeks and do not have pain than it is ok to stop all together.  Plan to wean off of opioids This plan should start within one week post op of your fracture surgery  Maintain the same interval or time between taking each dose and first decrease the dose.  Cut the total daily intake of opioids by one tablet each day Next start to increase the time between doses. The last dose that should be eliminated is the evening dose.    STOP SMOKING OR USING NICOTINE PRODUCTS!!!!  As discussed nicotine severely impairs your body's ability to heal surgical and traumatic wounds but also impairs bone healing.  Wounds and bone heal by forming microscopic blood vessels (angiogenesis) and nicotine is a vasoconstrictor (essentially, shrinks blood vessels).  Therefore, if vasoconstriction occurs to these microscopic blood vessels they essentially disappear and are unable to deliver necessary nutrients to the healing tissue.  This is one modifiable factor that you can do to dramatically increase your chances of healing your injury.  (This means no  smoking, no nicotine gum, patches, etc)  DO NOT USE NONSTEROIDAL ANTI-INFLAMMATORY DRUGS (NSAID'S)  Using products such as Advil  (ibuprofen ), Aleve  (naproxen ), Motrin  (ibuprofen ) for additional  pain control during fracture healing can delay and/or prevent the healing response.  If you would like to take over the counter (OTC) medication, Tylenol  (acetaminophen ) is ok.  However, some narcotic medications that are given for pain control contain acetaminophen  as well. Therefore, you should not exceed more than 4000 mg of tylenol  in a day if you do not have liver disease.  Also note that there are may OTC medicines, such as cold medicines and allergy medicines that my contain tylenol  as well.  If you have any questions about medications and/or interactions please ask your doctor/PA or your pharmacist.      ICE AND ELEVATE INJURED/OPERATIVE EXTREMITY  Using ice and elevating the injured extremity above your heart can help with swelling and pain control.  Icing in a pulsatile fashion, such as 20 minutes on and 20 minutes off, can be followed.    Do not place ice directly on skin. Make sure there is a barrier between to skin and the ice pack.    Using frozen items such as frozen peas works well as the conform nicely to the are that needs to be iced.    Discharge Wound Care Instructions  Do NOT apply any ointments, solutions or lotions to pin sites or surgical wounds.  These prevent needed drainage and even though solutions like hydrogen peroxide kill bacteria, they also damage cells lining the pin sites that help fight infection.  Applying lotions or ointments can keep the wounds moist and can cause them to breakdown and open up as well. This can increase the risk for infection. When in doubt call the office.  Surgical incisions should be dressed daily.  If any drainage is noted, use one layer of adaptic or Mepitel, then gauze, Kerlix, and an ace wrap. - These dressing supplies should be available at local medical supply stores ([facility], [facility], etc) as well as Insurance claims handler ([facility], [facility], [place], etc)  Once the incision is completely dry and without drainage, it may be  left open to air out.  Showering may begin 36-48 hours later.  Cleaning gently with soap and water.  Traumatic wounds should be dressed daily as well.    One layer of adaptic, gauze, Kerlix, then ace wrap.  The adaptic can be discontinued once the draining has ceased    If you have a wet to dry dressing: wet the gauze with saline the squeeze as much saline out so the gauze is moist (not soaking wet), place moistened gauze over wound, then place a dry gauze over the moist one, followed by Kerlix wrap, then ace wrap.    Call office for the following: Temperature greater than 101F Persistent nausea and vomiting Severe uncontrolled pain Redness, tenderness, or signs of infection (pain, swelling, redness, odor or green/yellow discharge around the site) Difficulty breathing, headache or visual disturbances Hives Persistent dizziness or light-headedness Extreme fatigue Any other questions or concerns you may have after discharge  In an emergency, call 911 or go to an Emergency Department at a nearby hospital  OTHER HELPFUL INFORMATION  If you had a block, it will wear off between 8-24 hrs postop typically.  This is period when your pain may go from nearly zero to the pain you would have had postop without the block.  This is an abrupt transition but nothing dangerous  is happening.  You may take an extra dose of narcotic when this happens.  You should wean off your narcotic medicines as soon as you are able.  Most patients will be off or using minimal narcotics before their first postop appointment.   We suggest you use the pain medication the first night prior to going to bed, in order to ease any pain when the anesthesia wears off. You should avoid taking pain medications on an empty stomach as it will make you nauseous.  Do not drink alcoholic beverages or take illicit drugs when taking pain medications.  In most states it is against the law to drive while you are in a splint or sling.   And certainly against the law to drive while taking narcotics.  You may return to work/school in the next couple of days when you feel up to it.   Pain medication may make you constipated.  Below are a few solutions to try in this order: Decrease the amount of pain medication if you aren't having pain. Drink lots of decaffeinated fluids. Drink prune juice and/or each dried prunes  If the first 3 don't work start with additional solutions Take Colace - an over-the-counter stool softener Take Senokot - an over-the-counter laxative Take Miralax  - a stronger over-the-counter laxative   Follow up with Dr Reyne in 2 weeks  Cordella Reyne, MD, MS [facility] [phone]

## 2024-09-08 NOTE — Anesthesia Procedure Notes (Signed)
 Procedure Name: Intubation Date/Time: 09/08/2024 3:40 PM  Performed by: Vincenzo Show, CRNAPre-anesthesia Checklist: Patient identified, Emergency Drugs available, Suction available, Patient being monitored and Timeout performed Patient Re-evaluated:Patient Re-evaluated prior to induction Oxygen Delivery Method: Circle system utilized Preoxygenation: Pre-oxygenation with 100% oxygen Induction Type: IV induction, Rapid sequence and Cricoid Pressure applied Laryngoscope Size: Mac and 3 Grade View: Grade II Tube type: Oral Tube size: 7.0 mm Number of attempts: 1 Airway Equipment and Method: Stylet Placement Confirmation: ETT inserted through vocal cords under direct vision, positive ETCO2, CO2 detector and breath sounds checked- equal and bilateral Secured at: 22 cm Tube secured with: Tape Dental Injury: Teeth and Oropharynx as per pre-operative assessment  Comments: ATOI

## 2024-09-08 NOTE — Op Note (Signed)
 DATE OF SURGERY:  09/08/2024  TIME: 4:30 PM  PATIENT NAME:  Doris Wyatt  AGE: 78 y.o.  PRE-OPERATIVE DIAGNOSIS:  Intertrochanteric fracture  POST-OPERATIVE DIAGNOSIS:  SAME  PROCEDURE:  INSERTION, INTRAMEDULLARY ROD, FEMUR  SURGEON:  Cordella SHAUNNA Rhein  ASSISTANT:  none  OPERATIVE IMPLANTS:  Implant Name Type Inv. Item Serial No. Manufacturer Lot No. LRB No. Used Action  NAIL IM AF HIP [id number] 125D LT - [id number] Nail NAIL IM AF HIP [id number] 125D LT  [employer](ORTH,TRAU,BIO,SG) [id number] Left 1 Implanted  SCREW LAG HIP NAIL 10.5X95 - [id number] Screw SCREW LAG HIP NAIL 10.5X95  [employer](ORTH,TRAU,BIO,SG) [id number] Left 1 Implanted    UNIQUE ASPECTS OF THE CASE:  none  ESTIMATED BLOOD LOSS: 100  PREOPERATIVE INDICATIONS:  Doris Wyatt is a 78 y.o. year old who fell and suffered an Intertrochanteric fracture. She was brought into the ER and then admitted and optimized and then elected for surgical intervention.    The risks benefits and alternatives were discussed with the patient including but not limited to the risks of nonoperative treatment, versus surgical intervention including infection, bleeding, nerve injury, malunion, nonunion, hardware prominence, hardware failure, need for hardware removal, blood clots, cardiopulmonary complications, morbidity, mortality, among others, and they were willing to proceed.    OPERATIVE PROCEDURE:  The patient was brought to the operating room and placed in the supine position. Anesthesia was administered. She was placed on the fracture table.  Closed reduction was performed under C-arm guidance.  Time out was then performed after sterile prep and drape. She received preoperative antibiotics.  Small incision proximal to the greater trochanter was made and carried down through skin and subcutaneous tissue.  Threaded guidewire was directed at the tip of the greater trochanter and advanced into the proximal metaphysis.   Positioning was confirmed with fluoroscopy.  I then used an entry reamer to enter the medullary canal.  A long guidewire was then passed through the length of the femoral canal.  We measured selecting of 400 mm long rod.  We used sequential reamers starting with 8 and advancing up to 12.5.  I then passed a 11 x 400 mm Biomet cephalomedullary nail down the center of the canal attached to the targeting arm.  I then used the targeting arm to make a percutaneous incision and directed a threaded guidewire up into the head/neck segment.  I confirmed adequate tip apex distance and measured the length.  I decided to place a 90 mm screw.  I then drilled the path for the compression screw and placed an antirotation bar.  I then placed the lag screw and then placed the compression screw and compressed approximately 2 mm.  The proximal portion of the nail was statically locked.   The distal screw was left unlocked this was felt to pass through the diaphysis of the bone  The wounds were irrigated copiously, and vancomycin powder was placed in the wounds.  The gluteal fascia was closed with #1 Vicryl, and skin was closed with 2-0 Vicryl and  staples.  Sterile dressing Mepilex dressing was applied . the patient was awakened and returned to PACU in stable and satisfactory condition. There were no complications and the patient tolerated the procedure well.   Post op recs: WB: WBAT left LE Abx: ancef  x23 hours post op Dressing: keep intact until follow up, change PRN if soiled or saturated. DVT prophylaxis: lovenox starting POD1 x4 weeks Follow up: 2 weeks after surgery for a wound check with  Dr. Reyne at [facility].  Address: [address], [place], [place] [address]  Office Phone: [phone]  Cathlyn Reyne, MD Orthopaedic Surgery

## 2024-09-08 NOTE — Transfer of Care (Signed)
 Immediate Anesthesia Transfer of Care Note  Patient: Doris Wyatt  Procedure(s) Performed: Procedure(s): INSERTION, INTRAMEDULLARY ROD, FEMUR (Left)  Patient Location: PACU  Anesthesia Type:General  Level of Consciousness:  sedated, patient cooperative and responds to stimulation  Airway & Oxygen Therapy:Patient Spontanous Breathing and Patient connected to face mask oxgen  Post-op Assessment:  Report given to PACU RN and Post -op Vital signs reviewed and stable  Post vital signs:  Reviewed and stable  Last Vitals:  Vitals:   09/08/24 1409 09/08/24 1508  BP: (!) 133/57 (!) 147/60  Pulse: 69 76  Resp: 14 16  Temp: 36.8 C 36.9 C  SpO2: 98% 92%    Complications: No apparent anesthesia complications

## 2024-09-08 NOTE — H&P (View-Only) (Signed)
 Orthopedic Consult  Patient ID: Doris Wyatt MRN: [id number] DOB/AGE: 80/12/1945 80 y.o.  Reason for Consult: Left hip fracture Referring Physician: Odell Castor  HPI: Doris Wyatt Card is an 78 y.o. female who fell yesterday.  She was walking into the house when she slipped on the steps from loose slippers.  She is independent ambulator.  She does have some degree of dementia.  She lives with her daughter.  The patient is somewhat confused and so the history was obtained from the daughter as well as her granddaughter.  According to the family, the patient had no antecedent hip pain.  They deny any other injuries or also fall.  While she is somewhat confused mentation is not changed from baseline.  Past Medical History:  Diagnosis Date   Abdominal pain, acute, left lower quadrant 10/31/2017   Arthritis    COPD (chronic obstructive pulmonary disease)    Diverticulitis of large intestine with perforation without abscess or bleeding    Generalized anxiety disorder    History of kidney stones    Hypercholesterolemia    Hypertension    Major depressive disorder    Major neurocognitive disorder due to Alzheimer's disease 07/01/2021   Osteoporosis    Pneumonia    hx of    Type II diabetes mellitus    Ureteral stone with hydronephrosis 10/31/2017   Urolithiasis     Past Surgical History:  Procedure Laterality Date   APPENDECTOMY     BACK SURGERY     x 2   CYSTOSCOPY W/ URETERAL STENT PLACEMENT Left 10/31/2017   Procedure: CYSTOSCOPY WITH RETROGRADE PYELOGRAM/LEFT URETERAL STENT PLACEMENT;  Surgeon: Watt Rush, MD;  Location: [facility];  Service: Urology;  Laterality: Left;   CYSTOSCOPY/URETEROSCOPY/HOLMIUM LASER/STENT PLACEMENT Left 11/09/2017   Procedure: CYSTOSCOPY LEFT URETEROSCOPY WITH HOLMIUM LASER STENT EXCHANGE, STONE BASKET RETRIVAL, STENT PLACEMENT;  Surgeon: Watt Rush, MD;  Location: [facility];  Service: Urology;  Laterality: Left;   TUBAL LIGATION       Family History  Problem Relation Age of Onset   Memory loss Mother     Social History:  reports that she quit smoking about 21 years ago. Her smoking use included cigarettes. She started smoking about 61 years ago. She has a 120 pack-year smoking history. She has never used smokeless tobacco. She reports current alcohol use of about 7.0 standard drinks of alcohol per week. She reports that she does not use drugs.  Allergies:  Allergies  Allergen Reactions   Morphine  Sulfate Other (See Comments)   Oxycodone -Acetaminophen  Other (See Comments)   Sertraline Hcl Other (See Comments)    Medications: I have reviewed the patient's current medications.  ROS: Constitutional: No fever or chills Vision: No changes in vision ENT: No difficulty swallowing CV: No chest pain Pulm: No SOB or wheezing GI: No nausea or vomiting GU: No urgency or inability to hold urine Skin: No poor wound healing Neurologic: No numbness or tingling Psychiatric: No depression or anxiety Heme: No bruising Allergic: No reaction to medications or food   Exam: Blood pressure 124/62, pulse 86, temperature 99.9 F (37.7 C), temperature source Oral, resp. rate 16, height 5' 5 (1.651 m), weight 44.3 kg, SpO2 98%. General: Well-appearing woman in no acute distress.  She is poorly communicative but does comply with exam Orientation: Alert but not oriented Mood and Affect: Calm   Injured Extremity (CV, lymph, sensation, reflexes): Left leg is shortened and externally rotated.  No apparent tenderness about the knee or ankle.  Good capillary refill.  Grossly neurologically intact.  No tenderness palpation crepitus defects or deformities of the bilateral upper extremities or right lower extremity     Medical Decision Making: Data: Imaging: AP pelvis and views of the left hip reveal left intertrochanteric hip fracture  Labs: White cell count 16.5.  Hemoglobin 12.5.  Hematocrit 38.5.Platelets 214.  Imaging or  Labs ordered: Will order INR  Medical history and chart was reviewed and case discussed with medical provider.  Assessment/Plan: Left trochanteric hip fracture  The patient does have a left intra contact hip fracture.  This require surgical stabilization.  We discussed the goals of surgery to stabilize the fracture, allow for healing, and allow for early mobilization.  We hope to avoid the morbidity associated with prolonged immobilization including bedsores pneumonias and blood clots.  We discussed the risks of surgery include but not limited to bleeding, infection, injury to nerves or tendons, nonunion, malunion, hardware failure, risk of anesthesia, risk of blood clots.  Naproxen  was obtained from the patient's family.  Will plan for surgery be done later on today.  We discussed the expected recovery time.  New problem w/ workup planned: High complexity diagnosis (Level 5) Surgery w/ risks or Emergency surgery: High complexity Risk (Level 5)  All others are Level 4 with comprehensive musculoskeletal exam.  Cordella Rhein, MD, MS [facility] [phone]

## 2024-09-08 NOTE — Progress Notes (Signed)
 SPIRITUAL CARE AND COUNSELING CONSULT NOTE   VISIT SUMMARY Chaplains received a consult to assist Doris Wyatt with advance directives.  Due to dementia diagnosis and current pain medication, she is not oriented and is unable to complete paperwork.  I spoke with her daughter about the family and ascertained that she and her two brothers would be next of kin.  Doris Wyatt lives with her daughter and her oldest son is also very involved with her care and would work together to make any medical decisions.   SPIRITUAL ENCOUNTER                                                                                                                                                                      Type of Visit: Initial Care provided to:: Patient, Family Conversation partners present during encounter: Nurse Referral source: Family Reason for visit: Advance directives OnCall Visit: No   SPIRITUAL CARE PLAN   Spiritual Care Issues Still Outstanding: No further spiritual care needs at this time (see row info)    If immediate needs arise, please page your campus 24 hour Spiritual Care and Counseling Pager   Doris Wyatt  09/08/2024 1:43 PM

## 2024-09-08 NOTE — Progress Notes (Signed)
 [facility] PROGRESS NOTE    Progress Note  Doris Wyatt  FMW:[id number] DOB: [birth year] DOA: 09/07/2024 PCP: [name], MD     Brief Narrative:   Doris Wyatt is an 78 y.o. female past medical history significant for dementia, COPD diabetes mellitus type 2 depression anxiety comes into the ED for left hip pain after a unwitnessed fall, did not hit her head.  Imaging showed an acute left intertrochanteric proximal fracture   Assessment/Plan:   Closed comminuted intertrochanteric fracture of proximal femur, left, initial encounter [facility]) Orthopedic surgery was consulted, they recommended n.p.o. for possible surgical intervention. Continue narcotics for pain control and started on a bowel regimen.   Hypertension associated with diabetes (HCC) Continue ARB, blood pressure seems to be well-controlled.  COPD (chronic obstructive pulmonary disease) Continue inhalers.  Hyperlipidemia associated with type 2 diabetes mellitus (HCC) Continue statins.  Type 2 diabetes mellitus (HCC) All oral hypoglycemic agents continue sliding scale insulin .  Major neurocognitive disorder due to Alzheimer's disease Continue current medications Aricept  Namenda  Depakote . High risk of delirium and aspiration.     DVT prophylaxis: lovenox Family Communication: Daughter Status is: Inpatient Remains inpatient appropriate because: Acute left hip fracture    Code Status:     Code Status Orders  (From admission, onward)           Start     Ordered   09/07/24 2344  Full code  Continuous       Question:  By:  Answer:  Consent: discussion documented in EHR   09/07/24 2344           Code Status History     Date Active Date Inactive Code Status Order ID Comments User Context   10/31/2017 1517 11/04/2017 1557 Full Code [id number]  Alm Maxwell LABOR, MD Inpatient         IV Access:   Peripheral IV   Procedures and diagnostic studies:   Chest Portable 1  View Result Date: 09/08/2024 EXAM: 1 VIEW XRAY OF THE CHEST 09/07/2024 11:57:00 PM COMPARISON: None available. CLINICAL HISTORY: Preop examination. Pre-op exam FINDINGS: LUNGS AND PLEURA: The lungs are symmetrically hyperinflated in keeping with changes of underlying COPD. HEART AND MEDIASTINUM: No acute abnormality of the cardiac and mediastinal silhouettes. BONES AND SOFT TISSUES: Healed right rib fractures. No acute osseous abnormality. IMPRESSION: 1. Symmetrically hyperinflated lungs, consistent with underlying COPD. 2. Healed right rib fractures. Electronically signed by: Dorethia Molt MD 09/08/2024 12:00 AM EDT RP Workstation: [id number]   CT Head Wo Contrast Result Date: 09/07/2024 EXAM: CT HEAD AND CERVICAL, THORACIC, AND LUMBAR SPINE 09/07/2024 10:36:15 PM TECHNIQUE: CT of the head and cervical, thoracic, and lumbar spine was performed without the administration of intravenous contrast. Multiplanar reformatted images are provided for review. Automated exposure control, iterative reconstruction, and/or weight based adjustment of the mA/kV was utilized to reduce the radiation dose to as low as reasonably achievable. COMPARISON: CT chest Apr 22, 2014 CLINICAL HISTORY: Head trauma, minor (Age >= 65y). Table formatting from the original note was not included. FINDINGS: CT HEAD BRAIN AND VENTRICLES: No acute intracranial hemorrhage. No mass effect or midline shift. No abnormal extra-axial fluid collection. No evidence of acute infarct. No hydrocephalus. ORBITS: No acute abnormality. SINUSES AND MASTOIDS: No acute abnormality. SOFT TISSUES AND SKULL: No acute skull fracture. No acute soft tissue abnormality. CT CERVICAL, THORACIC, LUMBAR SPINE BONES AND ALIGNMENT: No acute fracture or traumatic malalignment. L4-L5 PLIF. DEGENERATIVE CHANGES: No significant degenerative changes. SOFT TISSUES: No prevertebral soft  tissue swelling. Emphysema. Aortic atherosclerosis. IMPRESSION: 1. No acute intracranial  abnormality. 2. No acute fracture or traumatic malalignment of the cervical, thoracic, or lumbar spine. Electronically signed by: Gilmore Molt MD 09/07/2024 10:50 PM EDT RP Workstation: [id number]   CT Cervical Spine Wo Contrast Result Date: 09/07/2024 EXAM: CT HEAD AND CERVICAL, THORACIC, AND LUMBAR SPINE 09/07/2024 10:36:15 PM TECHNIQUE: CT of the head and cervical, thoracic, and lumbar spine was performed without the administration of intravenous contrast. Multiplanar reformatted images are provided for review. Automated exposure control, iterative reconstruction, and/or weight based adjustment of the mA/kV was utilized to reduce the radiation dose to as low as reasonably achievable. COMPARISON: CT chest Apr 22, 2014 CLINICAL HISTORY: Head trauma, minor (Age >= 65y). Table formatting from the original note was not included. FINDINGS: CT HEAD BRAIN AND VENTRICLES: No acute intracranial hemorrhage. No mass effect or midline shift. No abnormal extra-axial fluid collection. No evidence of acute infarct. No hydrocephalus. ORBITS: No acute abnormality. SINUSES AND MASTOIDS: No acute abnormality. SOFT TISSUES AND SKULL: No acute skull fracture. No acute soft tissue abnormality. CT CERVICAL, THORACIC, LUMBAR SPINE BONES AND ALIGNMENT: No acute fracture or traumatic malalignment. L4-L5 PLIF. DEGENERATIVE CHANGES: No significant degenerative changes. SOFT TISSUES: No prevertebral soft tissue swelling. Emphysema. Aortic atherosclerosis. IMPRESSION: 1. No acute intracranial abnormality. 2. No acute fracture or traumatic malalignment of the cervical, thoracic, or lumbar spine. Electronically signed by: Gilmore Molt MD 09/07/2024 10:50 PM EDT RP Workstation: [id number]   CT Lumbar Spine Wo Contrast Result Date: 09/07/2024 EXAM: CT HEAD AND CERVICAL, THORACIC, AND LUMBAR SPINE 09/07/2024 10:36:15 PM TECHNIQUE: CT of the head and cervical, thoracic, and lumbar spine was performed without the administration of  intravenous contrast. Multiplanar reformatted images are provided for review. Automated exposure control, iterative reconstruction, and/or weight based adjustment of the mA/kV was utilized to reduce the radiation dose to as low as reasonably achievable. COMPARISON: CT chest Apr 22, 2014 CLINICAL HISTORY: Head trauma, minor (Age >= 65y). Table formatting from the original note was not included. FINDINGS: CT HEAD BRAIN AND VENTRICLES: No acute intracranial hemorrhage. No mass effect or midline shift. No abnormal extra-axial fluid collection. No evidence of acute infarct. No hydrocephalus. ORBITS: No acute abnormality. SINUSES AND MASTOIDS: No acute abnormality. SOFT TISSUES AND SKULL: No acute skull fracture. No acute soft tissue abnormality. CT CERVICAL, THORACIC, LUMBAR SPINE BONES AND ALIGNMENT: No acute fracture or traumatic malalignment. L4-L5 PLIF. DEGENERATIVE CHANGES: No significant degenerative changes. SOFT TISSUES: No prevertebral soft tissue swelling. Emphysema. Aortic atherosclerosis. IMPRESSION: 1. No acute intracranial abnormality. 2. No acute fracture or traumatic malalignment of the cervical, thoracic, or lumbar spine. Electronically signed by: Gilmore Molt MD 09/07/2024 10:50 PM EDT RP Workstation: [id number]   CT Thoracic Spine Wo Contrast Result Date: 09/07/2024 EXAM: CT HEAD AND CERVICAL, THORACIC, AND LUMBAR SPINE 09/07/2024 10:36:15 PM TECHNIQUE: CT of the head and cervical, thoracic, and lumbar spine was performed without the administration of intravenous contrast. Multiplanar reformatted images are provided for review. Automated exposure control, iterative reconstruction, and/or weight based adjustment of the mA/kV was utilized to reduce the radiation dose to as low as reasonably achievable. COMPARISON: CT chest Apr 22, 2014 CLINICAL HISTORY: Head trauma, minor (Age >= 65y). Table formatting from the original note was not included. FINDINGS: CT HEAD BRAIN AND VENTRICLES: No acute  intracranial hemorrhage. No mass effect or midline shift. No abnormal extra-axial fluid collection. No evidence of acute infarct. No hydrocephalus. ORBITS: No acute abnormality. SINUSES AND MASTOIDS: No acute abnormality. SOFT  TISSUES AND SKULL: No acute skull fracture. No acute soft tissue abnormality. CT CERVICAL, THORACIC, LUMBAR SPINE BONES AND ALIGNMENT: No acute fracture or traumatic malalignment. L4-L5 PLIF. DEGENERATIVE CHANGES: No significant degenerative changes. SOFT TISSUES: No prevertebral soft tissue swelling. Emphysema. Aortic atherosclerosis. IMPRESSION: 1. No acute intracranial abnormality. 2. No acute fracture or traumatic malalignment of the cervical, thoracic, or lumbar spine. Electronically signed by: Gilmore Molt MD 09/07/2024 10:50 PM EDT RP Workstation: [id number]   DG Femur Min 2 Views Left Result Date: 09/07/2024 CLINICAL DATA:  Unwitnessed fall with deformity to the left hip. EXAM: LEFT FEMUR 2 VIEWS; PELVIS - 1-2 VIEW COMPARISON:  None Available. FINDINGS: There is an acute comminuted fracture of the inter trochanteric left proximal femur with mild varus angulation and impaction of the fracture fragments. No dislocation at the hip joint. Degenerative changes in the lower lumbar spine and in both hips. Pelvis appears intact. SI joints and symphysis pubis are not displaced. Postoperative changes in the lower lumbar spine. Vascular calcifications. Midshaft and distal femur appear intact. IMPRESSION: Acute comminuted fracture of the inter trochanteric region of the left proximal femur with mild varus angulation and impaction. Electronically Signed   By: Elsie Gravely M.D.   On: 09/07/2024 20:27   DG Pelvis 1-2 Views Result Date: 09/07/2024 CLINICAL DATA:  Unwitnessed fall with deformity to the left hip. EXAM: LEFT FEMUR 2 VIEWS; PELVIS - 1-2 VIEW COMPARISON:  None Available. FINDINGS: There is an acute comminuted fracture of the inter trochanteric left proximal femur with mild  varus angulation and impaction of the fracture fragments. No dislocation at the hip joint. Degenerative changes in the lower lumbar spine and in both hips. Pelvis appears intact. SI joints and symphysis pubis are not displaced. Postoperative changes in the lower lumbar spine. Vascular calcifications. Midshaft and distal femur appear intact. IMPRESSION: Acute comminuted fracture of the inter trochanteric region of the left proximal femur with mild varus angulation and impaction. Electronically Signed   By: Elsie Gravely M.D.   On: 09/07/2024 20:27     Medical Consultants:   None.   Subjective:    Doris Wyatt no complaints today.  Objective:    Vitals:   09/07/24 2300 09/08/24 0058 09/08/24 0508 09/08/24 0632  BP: (!) 165/71 (!) 167/79 124/62   Pulse: 78 93 86   Resp: 18 16 16    Temp:  98.9 F (37.2 C) 99.9 F (37.7 C)   TempSrc:  Oral Oral   SpO2: 93% 98% 98%   Weight:    44.3 kg  Height:    5' 5 (1.651 m)   SpO2: 98 %   Intake/Output Summary (Last 24 hours) at 09/08/2024 0648 Last data filed at 09/08/2024 0600 Gross per 24 hour  Intake 0 ml  Output 750 ml  Net -750 ml   Filed Weights   09/08/24 9367  Weight: 44.3 kg    Exam: General exam: In no acute distress. Respiratory system: Good air movement and clear to auscultation. Cardiovascular system: S1 & S2 heard, RRR. No JVD. Gastrointestinal system: Abdomen is nondistended, soft and nontender.  Extremities: No pedal edema. Skin: No rashes, lesions or ulcers Psychiatry: No judgment or insight   Data Reviewed:    Labs: Basic Metabolic Panel: Recent Labs  Lab 09/07/24 1937 09/08/24 0043  NA 141 140  K 3.8 4.5  CL 100 101  CO2 28 29  GLUCOSE 271* 379*  BUN 18 19  CREATININE 0.54 0.57  CALCIUM  9.4 8.9   GFR  Estimated Creatinine Clearance: 39.9 mL/min (by C-G formula based on SCr of 0.57 mg/dL). Liver Function Tests: No results for input(s): AST, ALT, ALKPHOS, BILITOT, PROT,  ALBUMIN in the last 168 hours. No results for input(s): LIPASE, AMYLASE in the last 168 hours. No results for input(s): AMMONIA in the last 168 hours. Coagulation profile No results for input(s): INR, PROTIME in the last 168 hours. COVID-19 Labs  No results for input(s): DDIMER, FERRITIN, LDH, CRP in the last 72 hours.  No results found for: SARSCOV2NAA  CBC: Recent Labs  Lab 09/07/24 1937 09/08/24 0043  WBC 9.4 16.5*  NEUTROABS 6.4  --   HGB 13.4 12.5  HCT 42.3 38.5  MCV 95.9 94.4  PLT 228 214   Cardiac Enzymes: No results for input(s): CKTOTAL, CKMB, CKMBINDEX, TROPONINI in the last 168 hours. BNP (last 3 results) No results for input(s): PROBNP in the last 8760 hours. CBG: Recent Labs  Lab 09/08/24 0059 09/08/24 0353  GLUCAP 360* 263*   D-Dimer: No results for input(s): DDIMER in the last 72 hours. Hgb A1c: No results for input(s): HGBA1C in the last 72 hours. Lipid Profile: No results for input(s): CHOL, HDL, LDLCALC, TRIG, CHOLHDL, LDLDIRECT in the last 72 hours. Thyroid  function studies: No results for input(s): TSH, T4TOTAL, T3FREE, THYROIDAB in the last 72 hours.  Invalid input(s): FREET3 Anemia work up: No results for input(s): VITAMINB12, FOLATE, FERRITIN, TIBC, IRON, RETICCTPCT in the last 72 hours. Sepsis Labs: Recent Labs  Lab 09/07/24 1937 09/08/24 0043  WBC 9.4 16.5*   Microbiology Recent Results (from the past 240 hours)  Surgical pcr screen     Status: None   Collection Time: 09/08/24  3:29 AM   Specimen: Nasal Mucosa; Nasal Swab  Result Value Ref Range Status   MRSA, PCR NEGATIVE NEGATIVE Final   Staphylococcus aureus NEGATIVE NEGATIVE Final    Comment: (NOTE) The Xpert SA Assay (FDA approved for NASAL specimens in patients 28 years of age and older), is one component of a comprehensive surveillance program. It is not intended to diagnose infection nor to guide or  monitor treatment. Performed at [facility], [address] [place], [place] [address]      Medications:    atorvastatin   40 mg Oral Daily   divalproex   125 mg Oral BID   donepezil   23 mg Oral QHS   insulin  aspart  0-9 Units Subcutaneous Q4H   irbesartan   75 mg Oral Daily   memantine   10 mg Oral BID   Continuous Infusions:    LOS: 1 day   Doris Wyatt  [facility]  09/08/2024, 6:48 AM

## 2024-09-08 NOTE — Progress Notes (Signed)
 Initial Nutrition Assessment  DOCUMENTATION CODES:   Severe malnutrition in context of chronic illness  INTERVENTION:  - NPO for OR. SLP eval pending.  - Glucerna Shake po BID once diet advanced. Each supplement provides 220 kcal and 10 grams of protein - Encourage intake as tolerated.  - Multivitamin with minerals daily. - Monitor weight trends.   NUTRITION DIAGNOSIS:   Severe Malnutrition related to chronic illness (COPD; dementia) as evidenced by severe fat depletion, severe muscle depletion.  GOAL:   Patient will meet greater than or equal to 90% of their needs  MONITOR:   PO intake, Supplement acceptance, Diet advancement, Weight trends  REASON FOR ASSESSMENT:   Consult Hip fracture protocol  ASSESSMENT:   78 y.o. female with PMH significant for dementia, COPD, T2DM, HTN, HLD, depression/anxiety who presented for evaluation of left hip pain after a fall. Admitted for left intertrochanteric fracture of proximal femur .  Patient in bed at time of visit. Daughter and granddaughter at bedside and provided all nutrition history.   UBW reported to be 94# and daughter reports the patient has been losing weight slowly over the past 2 years. Only weight available in chart over the past year is from April, when patient was weighed at 86# during an office visit. She is weighed this admission at 97#.   Daughter reports the patient eats very well at home with 3 meals a day in addition to Glucerna 1-2 times per day. Was eating normally up until admission.   Patient NPO for OR today. Discussed increased nutrient needs to promote healing. Family agreeable to have Glucerna added on once diet advanced.    Medications reviewed and include: Miralax   Labs reviewed:  - No HA1C available within the past 10 years  NUTRITION - FOCUSED PHYSICAL EXAM:  Flowsheet Row Most Recent Value  Orbital Region Severe depletion  Upper Arm Region Severe depletion  Thoracic and Lumbar Region  Severe depletion  Buccal Region Severe depletion  Temple Region Severe depletion  Clavicle Bone Region Severe depletion  Clavicle and Acromion Bone Region Severe depletion  Scapular Bone Region Severe depletion  Dorsal Hand Severe depletion  Patellar Region Moderate depletion  Anterior Thigh Region Moderate depletion  Posterior Calf Region Moderate depletion  Edema (RD Assessment) None  Hair Reviewed  Eyes Reviewed  Mouth Reviewed  Skin Reviewed  Nails Reviewed    Diet Order:   Diet Order             Diet NPO time specified  Diet effective ____           Diet NPO time specified Except for: Sips with Meds, Ice Chips  Diet effective midnight                   EDUCATION NEEDS:  Education needs have been addressed  Skin:  Skin Assessment: Reviewed RN Assessment  Last BM:  10/9  Height:  Ht Readings from Last 1 Encounters:  09/08/24 5' 5 (1.651 m)   Weight:  Wt Readings from Last 1 Encounters:  09/08/24 44.3 kg    BMI:  Body mass index is 16.25 kg/m.  Estimated Nutritional Needs:  Kcal:  1400-1550 kcals Protein:  65-75 grams Fluid:  >/= 1.5L    Trude Ned RD, LDN Contact via Secure Chat.

## 2024-09-09 DIAGNOSIS — S72142A Displaced intertrochanteric fracture of left femur, initial encounter for closed fracture: Secondary | ICD-10-CM | POA: Diagnosis not present

## 2024-09-09 LAB — CBC
HCT: 31 % — ABNORMAL LOW (ref 36.0–46.0)
Hemoglobin: 9.5 g/dL — ABNORMAL LOW (ref 12.0–15.0)
MCH: 30.4 pg (ref 26.0–34.0)
MCHC: 30.6 g/dL (ref 30.0–36.0)
MCV: 99 fL (ref 80.0–100.0)
Platelets: 175 K/uL (ref 150–400)
RBC: 3.13 MIL/uL — ABNORMAL LOW (ref 3.87–5.11)
RDW: 13.2 % (ref 11.5–15.5)
WBC: 11.7 K/uL — ABNORMAL HIGH (ref 4.0–10.5)
nRBC: 0 % (ref 0.0–0.2)

## 2024-09-09 LAB — BASIC METABOLIC PANEL WITH GFR
Anion gap: 9 (ref 5–15)
BUN: 25 mg/dL — ABNORMAL HIGH (ref 8–23)
CO2: 29 mmol/L (ref 22–32)
Calcium: 9 mg/dL (ref 8.9–10.3)
Chloride: 101 mmol/L (ref 98–111)
Creatinine, Ser: 0.7 mg/dL (ref 0.44–1.00)
GFR, Estimated: >60 mL/min (ref >60)
Glucose, Bld: 191 mg/dL — ABNORMAL HIGH (ref 70–99)
Potassium: 4.9 mmol/L (ref 3.5–5.1)
Sodium: 139 mmol/L (ref 135–145)

## 2024-09-09 LAB — GLUCOSE, CAPILLARY
Glucose-Capillary: 191 mg/dL — ABNORMAL HIGH (ref 70–99)
Glucose-Capillary: 191 mg/dL — ABNORMAL HIGH (ref 70–99)
Glucose-Capillary: 251 mg/dL — ABNORMAL HIGH (ref 70–99)
Glucose-Capillary: 272 mg/dL — ABNORMAL HIGH (ref 70–99)
Glucose-Capillary: 290 mg/dL — ABNORMAL HIGH (ref 70–99)
Glucose-Capillary: 290 mg/dL — ABNORMAL HIGH (ref 70–99)

## 2024-09-09 MED ORDER — PROCHLORPERAZINE EDISYLATE 10 MG/2ML IJ SOLN
5.0000 mg | Freq: Once | INTRAMUSCULAR | Status: AC
  Administered 2024-09-09: 5 mg via INTRAVENOUS
  Filled 2024-09-09: qty 2

## 2024-09-09 NOTE — Progress Notes (Signed)
 PT Cancellation Note  Patient Details Name: Doris Wyatt MRN: [id number] DOB: [birth year]   Cancelled Treatment:     PT order received but eval deferred this am at request of pt family.  Pt sleeping after difficult night and family requests PT return later.  Will follow up as able.   Carmon Brigandi 09/09/2024, 12:50 PM

## 2024-09-09 NOTE — Evaluation (Signed)
 Occupational Therapy Evaluation Patient Details Name: Doris Wyatt MRN: [id number] DOB: 03-30-1946 Today's Date: 09/09/2024   History of Present Illness   Pt s/p fall with L hip fx and now s/p intrameullary rod insertion.  Pt with hx of COPD, MDD, Alzheimers, DM, back surgery, and osteoporosis     Clinical Impressions Pt resting in bed comfortably, no complaints, several family members present during session. Pt with 10/10 pain with movement to EOB to L hip, pain meds given towards end of session. Pt lives at home with daughter and other family, PLOF no AD for ambulation, good overall strength, balance, ROM, but did need verbal cueing for all activities due to dementia. Pt currently requires significant assistance to assist to EOB, max/total A for bed mobility, once sitting EOB fair sitting balance. Pt stands with mod/max A x2 with RW, limited by pain. Pt min A for UB ADLs sitting EOB. Pt likely will do well once pain is managed, seems to have good strength. Recommending postacute rehab <3hrs/day to maximize functional strength and participation with ADLs, will continue to see acutely to progress as able.      If plan is discharge home, recommend the following:   Two people to help with walking and/or transfers;A lot of help with bathing/dressing/bathroom;Assistance with cooking/housework;Assist for transportation;Help with stairs or ramp for entrance     Functional Status Assessment   Patient has had a recent decline in their functional status and demonstrates the ability to make significant improvements in function in a reasonable and predictable amount of time.     Equipment Recommendations   Other (comment) (defer)     Recommendations for Other Services         Precautions/Restrictions   Precautions Precautions: Fall Recall of Precautions/Restrictions: Impaired Restrictions Weight Bearing Restrictions Per Provider Order: Yes LLE Weight Bearing Per Provider  Order: Weight bearing as tolerated     Mobility Bed Mobility Overal bed mobility: Needs Assistance Bed Mobility: Supine to Sit, Sit to Supine     Supine to sit: Max assist, Total assist, +2 for physical assistance, +2 for safety/equipment Sit to supine: Max assist, Total assist, +2 for physical assistance, +2 for safety/equipment   General bed mobility comments: max/total x2, pain in L hip limits movement, helicopter back to bed    Transfers Overall transfer level: Needs assistance Equipment used: Rolling walker (2 wheels) Transfers: Sit to/from Stand Sit to Stand: Mod assist, Max assist, +2 physical assistance, From elevated surface           General transfer comment: mod/max A for STS due to pain in L hip      Balance Overall balance assessment: Needs assistance Sitting-balance support: No upper extremity supported, Feet supported Sitting balance-Leahy Scale: Fair     Standing balance support: Bilateral upper extremity supported, During functional activity, Reliant on assistive device for balance Standing balance-Leahy Scale: Poor Standing balance comment: reliant on RW for support, not able to WB much through LLE                           ADL either performed or assessed with clinical judgement   ADL Overall ADL's : Needs assistance/impaired Eating/Feeding: Set up;Cueing for sequencing;Sitting   Grooming: Set up;Cueing for sequencing;Sitting   Upper Body Bathing: Minimal assistance;Cueing for sequencing;Sitting   Lower Body Bathing: Total assistance;Sitting/lateral leans   Upper Body Dressing : Minimal assistance;Cueing for sequencing;Sitting   Lower Body Dressing: Total assistance;Sitting/lateral leans;Sit to/from stand  Toilet Transfer: Moderate assistance;Maximal assistance;Total assistance;+2 for physical assistance;Stand-pivot;Rolling walker (2 wheels);BSC/3in1   Toileting- Clothing Manipulation and Hygiene: Maximal  assistance;Sitting/lateral lean         General ADL Comments: Pt with good BUE strength/ROM, advanced dementia requires increased cueing for participation but able to follow commands with increased time. Pt total A for LB ADLs due to pain and stiffness at L hip, WBAT to LLE. Able to stand with mod/max A x2 support using RW     Vision         Perception         Praxis         Pertinent Vitals/Pain Pain Assessment Pain Assessment: Faces Faces Pain Scale: Hurts worst Pain Location: LLE with movement Pain Descriptors / Indicators: Grimacing, Guarding, Moaning Pain Intervention(s): Monitored during session     Extremity/Trunk Assessment Upper Extremity Assessment Upper Extremity Assessment: Overall WFL for tasks assessed   Lower Extremity Assessment Lower Extremity Assessment: Generalized weakness;LLE deficits/detail LLE: Unable to fully assess due to pain   Cervical / Trunk Assessment Cervical / Trunk Assessment: Kyphotic   Communication Communication Communication: No apparent difficulties   Cognition Arousal: Alert Behavior During Therapy: Anxious, Flat affect Cognition: History of cognitive impairments             OT - Cognition Comments: history of significant dementia, verbal cueing for all activities                 Following commands: Impaired Following commands impaired: Follows one step commands with increased time     Cueing  General Comments   Cueing Techniques: Verbal cues;Gestural cues      Exercises     Shoulder Instructions      Home Living Family/patient expects to be discharged to:: Private residence Living Arrangements: Children Available Help at Discharge: Available 24 hours/day Type of Home: House Home Access: Stairs to enter [employer] of Steps: 1 small threshold step   Home Layout: One level     Bathroom Shower/Tub: Tub/shower unit         Home Equipment: None   Additional Comments: Pt lives  with daughter and their family, has constant support      Prior Functioning/Environment Prior Level of Function : Needs assist             Mobility Comments: ind with no AD, does not have RW or any DME ADLs Comments: supervision for verbal cueing, washing her back    OT Problem List: Decreased strength;Decreased range of motion;Decreased activity tolerance;Impaired balance (sitting and/or standing);Decreased cognition;Decreased safety awareness;Pain   OT Treatment/Interventions: Self-care/ADL training;Therapeutic exercise;DME and/or AE instruction;Energy conservation;Therapeutic activities;Patient/family education;Balance training      OT Goals(Current goals can be found in the care plan section)   Acute Rehab OT Goals Patient Stated Goal: not able to participate in setting goals OT Goal Formulation: With family Time For Goal Achievement: 09/23/24 Potential to Achieve Goals: Good   OT Frequency:  Min 2X/week    Co-evaluation              AM-PAC OT 6 Clicks Daily Activity     Outcome Measure Help from another person eating meals?: A Little Help from another person taking care of personal grooming?: A Little Help from another person toileting, which includes using toliet, bedpan, or urinal?: A Lot Help from another person bathing (including washing, rinsing, drying)?: A Lot Help from another person to put on and taking off regular upper body clothing?: A Little  Help from another person to put on and taking off regular lower body clothing?: Total 6 Click Score: 14   End of Session Equipment Utilized During Treatment: Gait belt;Rolling walker (2 wheels) Nurse Communication: Mobility status  Activity Tolerance: Patient tolerated treatment well Patient left: in bed;with call bell/phone within reach;with bed alarm set;with family/visitor present  OT Visit Diagnosis: Unsteadiness on feet (R26.81);Other abnormalities of gait and mobility (R26.89);Muscle weakness  (generalized) (M62.81);Pain;Other symptoms and signs involving cognitive function Pain - Right/Left: Left Pain - part of body: Hip                Time: 1558-1630 OT Time Calculation (min): 32 min Charges:  OT General Charges $OT Visit: 1 Visit OT Evaluation $OT Eval Moderate Complexity: 1 Mod OT Treatments $Self Care/Home Management : 8-22 mins  [place], OTR/L   Elouise JONELLE Bott 09/09/2024, 4:41 PM

## 2024-09-09 NOTE — Progress Notes (Signed)
 Orthopaedic Progress Note  S: The patient is resting in bed.  She is communicative but has poor cognition and no memory of injuring her left hip her. family is at the bedside.  O:  Vitals:   09/09/24 0949 09/09/24 1000  BP: (!) 113/46 (!) 106/57  Pulse: 67 75  Resp: 16 16  Temp: 98.2 F (36.8 C)   SpO2: 96% 95%    Clean dressings on the left hip.  Thigh soft and compressible.  No calf tenderness.  Grossly moving her left foot.  Grossly intact sensation    Labs:  Results for orders placed or performed during the hospital encounter of 09/07/24 (from the past 24 hours)  Glucose, capillary     Status: Abnormal   Collection Time: 09/08/24 11:18 AM  Result Value Ref Range   Glucose-Capillary 173 (H) 70 - 99 mg/dL  Prepare RBC (crossmatch)     Status: None   Collection Time: 09/08/24  4:00 PM  Result Value Ref Range   Order Confirmation      ORDER PROCESSED BY BLOOD BANK Performed at [facility], [address], [place], [place] [address]   CBC     Status: Abnormal   Collection Time: 09/08/24  5:09 PM  Result Value Ref Range   WBC 16.4 (H) 4.0 - 10.5 K/uL   RBC 3.76 (L) 3.87 - 5.11 MIL/uL   Hemoglobin 11.5 (L) 12.0 - 15.0 g/dL   HCT 63.5 63.9 - 53.9 %   MCV 96.8 80.0 - 100.0 fL   MCH 30.6 26.0 - 34.0 pg   MCHC 31.6 30.0 - 36.0 g/dL   RDW 86.7 88.4 - 84.4 %   Platelets 231 150 - 400 K/uL   nRBC 0.0 0.0 - 0.2 %  Creatinine, serum     Status: None   Collection Time: 09/08/24  5:09 PM  Result Value Ref Range   Creatinine, Ser 0.60 0.44 - 1.00 mg/dL   GFR, Estimated >39 >39 mL/min  Glucose, capillary     Status: Abnormal   Collection Time: 09/08/24  8:17 PM  Result Value Ref Range   Glucose-Capillary 257 (H) 70 - 99 mg/dL  Glucose, capillary     Status: Abnormal   Collection Time: 09/09/24 12:30 AM  Result Value Ref Range   Glucose-Capillary 251 (H) 70 - 99 mg/dL  Glucose, capillary     Status: Abnormal   Collection Time: 09/09/24  4:14 AM  Result  Value Ref Range   Glucose-Capillary 191 (H) 70 - 99 mg/dL  Glucose, capillary     Status: Abnormal   Collection Time: 09/09/24  7:58 AM  Result Value Ref Range   Glucose-Capillary 191 (H) 70 - 99 mg/dL  Basic metabolic panel with GFR     Status: Abnormal   Collection Time: 09/09/24  9:36 AM  Result Value Ref Range   Sodium 139 135 - 145 mmol/L   Potassium 4.9 3.5 - 5.1 mmol/L   Chloride 101 98 - 111 mmol/L   CO2 29 22 - 32 mmol/L   Glucose, Bld 191 (H) 70 - 99 mg/dL   BUN 25 (H) 8 - 23 mg/dL   Creatinine, Ser 9.29 0.44 - 1.00 mg/dL   Calcium  9.0 8.9 - 10.3 mg/dL   GFR, Estimated >39 >39 mL/min   Anion gap 9 5 - 15  CBC     Status: Abnormal   Collection Time: 09/09/24  9:36 AM  Result Value Ref Range   WBC 11.7 (H) 4.0 -  10.5 K/uL   RBC 3.13 (L) 3.87 - 5.11 MIL/uL   Hemoglobin 9.5 (L) 12.0 - 15.0 g/dL   HCT 68.9 (L) 63.9 - 53.9 %   MCV 99.0 80.0 - 100.0 fL   MCH 30.4 26.0 - 34.0 pg   MCHC 30.6 30.0 - 36.0 g/dL   RDW 86.7 88.4 - 84.4 %   Platelets 175 150 - 400 K/uL   nRBC 0.0 0.0 - 0.2 %    Assessment: Postop day 1 status post IM rod left femur  The patient is doing well.  Her pain seems to be tolerable.  Will mobilize with physical therapy.  She may weightbearing as tolerated.  Will start Lovenox for DVT prophylaxis.  Will place her on bowel regimen.  She will likely require rehab placement will work with case management to arrange this.  Her H&H is stable but we will continue observe this  Injuries: Left trochanteric hip fracture  Weightbearing: Weightbearing as tolerated  Insicional and dressing care: Dressing changes as needed      Pain management: Continue current pain regimen  VTE prophylaxis: Lovenox    Dispo: Will likely require rehab placement  Follow - up plan: Will follow while here in the hospital, upon discharge will need to follow-up in 2 weeks   Cordella Rhein, MD, MS [facility] [phone]

## 2024-09-09 NOTE — Progress Notes (Signed)
 [facility] PROGRESS NOTE    Progress Note  Doris Wyatt  FMW:[id number] DOB: 1946-10-07 DOA: 09/07/2024 PCP: [name], MD     Brief Narrative:   Doris Wyatt is an 78 y.o. female past medical history significant for dementia, COPD diabetes mellitus type 2 depression anxiety comes into the ED for left hip pain after a unwitnessed fall, did not hit her head.  Imaging showed an acute left intertrochanteric proximal fracture   Assessment/Plan:   Closed comminuted intertrochanteric fracture of proximal femur, left, initial encounter [facility]) Orthopedic surgery was consulted, t he is status post intramedullary nailing of the femur. PT OT eval is pending.  Hemoglobin appears relatively stable. Family would like for her to go to a rehab facility temporarily   Hypertension associated with diabetes (HCC) Blood pressure well-controlled current regimen.  COPD (chronic obstructive pulmonary disease) Continue inhalers.  Hyperlipidemia associated with type 2 diabetes mellitus (HCC) Continue statins.  Type 2 diabetes mellitus (HCC) All oral hypoglycemic agents continue sliding scale insulin .  Major neurocognitive disorder due to Alzheimer's disease Continue current medications Aricept  Namenda  Depakote . High risk of delirium and aspiration.     DVT prophylaxis: lovenox Family Communication: Daughter Status is: Inpatient Remains inpatient appropriate because: Acute left hip fracture    Code Status:     Code Status Orders  (From admission, onward)           Start     Ordered   09/07/24 2344  Full code  Continuous       Question:  By:  Answer:  Consent: discussion documented in EHR   09/07/24 2344           Code Status History     Date Active Date Inactive Code Status Order ID Comments User Context   10/31/2017 1517 11/04/2017 1557 Full Code [id number]  Alm Maxwell LABOR, MD Inpatient         IV Access:   Peripheral IV   Procedures and  diagnostic studies:   ECHOCARDIOGRAM COMPLETE Result Date: 09/08/2024    ECHOCARDIOGRAM REPORT   Patient Name:   Doris Wyatt Date of Exam: 09/08/2024 Medical Rec #:  [id number]           Height:       65.0 in Accession #:    [id number]          Weight:       97.7 lb Date of Birth:  04/20/1946           BSA:          1.459 m Patient Age:    77 years            BP:           132/56 mmHg Patient Gender: F                   HR:           89 bpm. Exam Location:  Inpatient Procedure: 2D Echo, Cardiac Doppler and Color Doppler (Both Spectral and Color            Flow Doppler were utilized during procedure). Indications:    Dyspnea  History:        Patient has no prior history of Echocardiogram examinations.                 COPD; Risk Factors:Hypertension and Diabetes.  Sonographer:    Philomena Daring Referring Phys: [id number] Cordarryl Monrreal FELIZ ORTIZ IMPRESSIONS  1.  Left ventricular ejection fraction, by estimation, is 60 to 65%. The left ventricle has normal function. The left ventricle has no regional wall motion abnormalities. Left ventricular diastolic parameters were normal.  2. Right ventricular systolic function is normal. The right ventricular size is normal. There is normal pulmonary artery systolic pressure.  3. The mitral valve is grossly normal. Trivial mitral valve regurgitation. No evidence of mitral stenosis.  4. The aortic valve was not well visualized. There is mild calcification of the aortic valve. There is mild thickening of the aortic valve. Aortic valve regurgitation is not visualized. No aortic stenosis is present.  5. The inferior vena cava is normal in size with greater than 50% respiratory variability, suggesting right atrial pressure of 3 mmHg. Comparison(s): No prior Echocardiogram. Conclusion(s)/Recommendation(s): Normal biventricular function without evidence of hemodynamically significant valvular heart disease. FINDINGS  Left Ventricle: Left ventricular ejection fraction, by estimation, is 60  to 65%. The left ventricle has normal function. The left ventricle has no regional wall motion abnormalities. The left ventricular internal cavity size was normal in size. There is  no left ventricular hypertrophy. Left ventricular diastolic parameters were normal. Right Ventricle: The right ventricular size is normal. No increase in right ventricular wall thickness. Right ventricular systolic function is normal. There is normal pulmonary artery systolic pressure. The tricuspid regurgitant velocity is 2.46 m/s, and  with an assumed right atrial pressure of 3 mmHg, the estimated right ventricular systolic pressure is 27.2 mmHg. Left Atrium: Left atrial size was normal in size. Right Atrium: Right atrial size was normal in size. Pericardium: There is no evidence of pericardial effusion. Mitral Valve: The mitral valve is grossly normal. There is mild thickening of the mitral valve leaflet(s). Mild to moderate mitral annular calcification. Trivial mitral valve regurgitation. No evidence of mitral valve stenosis. Tricuspid Valve: The tricuspid valve is normal in structure. Tricuspid valve regurgitation is trivial. No evidence of tricuspid stenosis. Aortic Valve: The aortic valve was not well visualized. There is mild calcification of the aortic valve. There is mild thickening of the aortic valve. Aortic valve regurgitation is not visualized. No aortic stenosis is present. Pulmonic Valve: The pulmonic valve was not well visualized. Pulmonic valve regurgitation is not visualized. No evidence of pulmonic stenosis. Aorta: The aortic root is normal in size and structure. Venous: The inferior vena cava is normal in size with greater than 50% respiratory variability, suggesting right atrial pressure of 3 mmHg. IAS/Shunts: The atrial septum is grossly normal.  LEFT VENTRICLE PLAX 2D LVIDd:         3.80 cm   Diastology LVIDs:         2.60 cm   LV e' medial:    8.05 cm/s LV PW:         0.90 cm   LV E/e' medial:  10.4 LV IVS:         0.90 cm   LV e' lateral:   8.81 cm/s LVOT diam:     2.00 cm   LV E/e' lateral: 9.5 LV SV:         56 LV SV Index:   39 LVOT Area:     3.14 cm  RIGHT VENTRICLE             IVC RV S prime:     10.90 cm/s  IVC diam: 1.60 cm TAPSE (M-mode): 2.3 cm LEFT ATRIUM           Index        RIGHT ATRIUM  Index LA Vol (A2C): 32.1 ml 22.01 ml/m  RA Area:     10.60 cm LA Vol (A4C): 18.9 ml 12.96 ml/m  RA Volume:   23.10 ml  15.84 ml/m  AORTIC VALVE LVOT Vmax:   77.10 cm/s LVOT Vmean:  51.400 cm/s LVOT VTI:    0.179 m  AORTA Ao Root diam: 2.40 cm MITRAL VALVE               TRICUSPID VALVE MV Area (PHT): 3.38 cm    TR Peak grad:   24.2 mmHg MV E velocity: 84.00 cm/s  TR Vmax:        246.00 cm/s MV A velocity: 71.60 cm/s MV E/A ratio:  1.17        SHUNTS                            Systemic VTI:  0.18 m                            Systemic Diam: 2.00 cm Shelda Bruckner MD Electronically signed by Shelda Bruckner MD Signature Date/Time: 09/08/2024/6:27:09 PM    Final    DG FEMUR MIN 2 VIEWS LEFT Result Date: 09/08/2024 CLINICAL DATA:  Elective surgery. EXAM: LEFT FEMUR 2 VIEWS COMPARISON:  Preoperative imaging. FINDINGS: Ten fluoroscopic spot views of the left femur submitted from the operating room. Femoral intramedullary nail with trans trochanteric and distal locking screw fixation traverse proximal femur fracture. Fluoroscopy time 31 seconds. Dose 3.9139 mGy. IMPRESSION: Intraoperative fluoroscopy during left femur ORIF. Electronically Signed   By: Andrea Gasman M.D.   On: 09/08/2024 17:23   DG C-Arm 1-60 Min-No Report Result Date: 09/08/2024 Fluoroscopy was utilized by the requesting physician.  No radiographic interpretation.   Chest Portable 1 View Result Date: 09/08/2024 EXAM: 1 VIEW XRAY OF THE CHEST 09/07/2024 11:57:00 PM COMPARISON: None available. CLINICAL HISTORY: Preop examination. Pre-op exam FINDINGS: LUNGS AND PLEURA: The lungs are symmetrically hyperinflated in keeping with  changes of underlying COPD. HEART AND MEDIASTINUM: No acute abnormality of the cardiac and mediastinal silhouettes. BONES AND SOFT TISSUES: Healed right rib fractures. No acute osseous abnormality. IMPRESSION: 1. Symmetrically hyperinflated lungs, consistent with underlying COPD. 2. Healed right rib fractures. Electronically signed by: Dorethia Molt MD 09/08/2024 12:00 AM EDT RP Workstation: [id number]   CT Head Wo Contrast Result Date: 09/07/2024 EXAM: CT HEAD AND CERVICAL, THORACIC, AND LUMBAR SPINE 09/07/2024 10:36:15 PM TECHNIQUE: CT of the head and cervical, thoracic, and lumbar spine was performed without the administration of intravenous contrast. Multiplanar reformatted images are provided for review. Automated exposure control, iterative reconstruction, and/or weight based adjustment of the mA/kV was utilized to reduce the radiation dose to as low as reasonably achievable. COMPARISON: CT chest Apr 22, 2014 CLINICAL HISTORY: Head trauma, minor (Age >= 65y). Table formatting from the original note was not included. FINDINGS: CT HEAD BRAIN AND VENTRICLES: No acute intracranial hemorrhage. No mass effect or midline shift. No abnormal extra-axial fluid collection. No evidence of acute infarct. No hydrocephalus. ORBITS: No acute abnormality. SINUSES AND MASTOIDS: No acute abnormality. SOFT TISSUES AND SKULL: No acute skull fracture. No acute soft tissue abnormality. CT CERVICAL, THORACIC, LUMBAR SPINE BONES AND ALIGNMENT: No acute fracture or traumatic malalignment. L4-L5 PLIF. DEGENERATIVE CHANGES: No significant degenerative changes. SOFT TISSUES: No prevertebral soft tissue swelling. Emphysema. Aortic atherosclerosis. IMPRESSION: 1. No acute intracranial abnormality. 2. No acute fracture or traumatic  malalignment of the cervical, thoracic, or lumbar spine. Electronically signed by: Gilmore Molt MD 09/07/2024 10:50 PM EDT RP Workstation: [id number]   CT Cervical Spine Wo Contrast Result Date:  09/07/2024 EXAM: CT HEAD AND CERVICAL, THORACIC, AND LUMBAR SPINE 09/07/2024 10:36:15 PM TECHNIQUE: CT of the head and cervical, thoracic, and lumbar spine was performed without the administration of intravenous contrast. Multiplanar reformatted images are provided for review. Automated exposure control, iterative reconstruction, and/or weight based adjustment of the mA/kV was utilized to reduce the radiation dose to as low as reasonably achievable. COMPARISON: CT chest Apr 22, 2014 CLINICAL HISTORY: Head trauma, minor (Age >= 65y). Table formatting from the original note was not included. FINDINGS: CT HEAD BRAIN AND VENTRICLES: No acute intracranial hemorrhage. No mass effect or midline shift. No abnormal extra-axial fluid collection. No evidence of acute infarct. No hydrocephalus. ORBITS: No acute abnormality. SINUSES AND MASTOIDS: No acute abnormality. SOFT TISSUES AND SKULL: No acute skull fracture. No acute soft tissue abnormality. CT CERVICAL, THORACIC, LUMBAR SPINE BONES AND ALIGNMENT: No acute fracture or traumatic malalignment. L4-L5 PLIF. DEGENERATIVE CHANGES: No significant degenerative changes. SOFT TISSUES: No prevertebral soft tissue swelling. Emphysema. Aortic atherosclerosis. IMPRESSION: 1. No acute intracranial abnormality. 2. No acute fracture or traumatic malalignment of the cervical, thoracic, or lumbar spine. Electronically signed by: Gilmore Molt MD 09/07/2024 10:50 PM EDT RP Workstation: [id number]   CT Lumbar Spine Wo Contrast Result Date: 09/07/2024 EXAM: CT HEAD AND CERVICAL, THORACIC, AND LUMBAR SPINE 09/07/2024 10:36:15 PM TECHNIQUE: CT of the head and cervical, thoracic, and lumbar spine was performed without the administration of intravenous contrast. Multiplanar reformatted images are provided for review. Automated exposure control, iterative reconstruction, and/or weight based adjustment of the mA/kV was utilized to reduce the radiation dose to as low as reasonably achievable.  COMPARISON: CT chest Apr 22, 2014 CLINICAL HISTORY: Head trauma, minor (Age >= 65y). Table formatting from the original note was not included. FINDINGS: CT HEAD BRAIN AND VENTRICLES: No acute intracranial hemorrhage. No mass effect or midline shift. No abnormal extra-axial fluid collection. No evidence of acute infarct. No hydrocephalus. ORBITS: No acute abnormality. SINUSES AND MASTOIDS: No acute abnormality. SOFT TISSUES AND SKULL: No acute skull fracture. No acute soft tissue abnormality. CT CERVICAL, THORACIC, LUMBAR SPINE BONES AND ALIGNMENT: No acute fracture or traumatic malalignment. L4-L5 PLIF. DEGENERATIVE CHANGES: No significant degenerative changes. SOFT TISSUES: No prevertebral soft tissue swelling. Emphysema. Aortic atherosclerosis. IMPRESSION: 1. No acute intracranial abnormality. 2. No acute fracture or traumatic malalignment of the cervical, thoracic, or lumbar spine. Electronically signed by: Gilmore Molt MD 09/07/2024 10:50 PM EDT RP Workstation: [id number]   CT Thoracic Spine Wo Contrast Result Date: 09/07/2024 EXAM: CT HEAD AND CERVICAL, THORACIC, AND LUMBAR SPINE 09/07/2024 10:36:15 PM TECHNIQUE: CT of the head and cervical, thoracic, and lumbar spine was performed without the administration of intravenous contrast. Multiplanar reformatted images are provided for review. Automated exposure control, iterative reconstruction, and/or weight based adjustment of the mA/kV was utilized to reduce the radiation dose to as low as reasonably achievable. COMPARISON: CT chest Apr 22, 2014 CLINICAL HISTORY: Head trauma, minor (Age >= 65y). Table formatting from the original note was not included. FINDINGS: CT HEAD BRAIN AND VENTRICLES: No acute intracranial hemorrhage. No mass effect or midline shift. No abnormal extra-axial fluid collection. No evidence of acute infarct. No hydrocephalus. ORBITS: No acute abnormality. SINUSES AND MASTOIDS: No acute abnormality. SOFT TISSUES AND SKULL: No acute skull  fracture. No acute soft tissue abnormality. CT CERVICAL, THORACIC, LUMBAR SPINE  BONES AND ALIGNMENT: No acute fracture or traumatic malalignment. L4-L5 PLIF. DEGENERATIVE CHANGES: No significant degenerative changes. SOFT TISSUES: No prevertebral soft tissue swelling. Emphysema. Aortic atherosclerosis. IMPRESSION: 1. No acute intracranial abnormality. 2. No acute fracture or traumatic malalignment of the cervical, thoracic, or lumbar spine. Electronically signed by: Gilmore Molt MD 09/07/2024 10:50 PM EDT RP Workstation: [id number]   DG Femur Min 2 Views Left Result Date: 09/07/2024 CLINICAL DATA:  Unwitnessed fall with deformity to the left hip. EXAM: LEFT FEMUR 2 VIEWS; PELVIS - 1-2 VIEW COMPARISON:  None Available. FINDINGS: There is an acute comminuted fracture of the inter trochanteric left proximal femur with mild varus angulation and impaction of the fracture fragments. No dislocation at the hip joint. Degenerative changes in the lower lumbar spine and in both hips. Pelvis appears intact. SI joints and symphysis pubis are not displaced. Postoperative changes in the lower lumbar spine. Vascular calcifications. Midshaft and distal femur appear intact. IMPRESSION: Acute comminuted fracture of the inter trochanteric region of the left proximal femur with mild varus angulation and impaction. Electronically Signed   By: Elsie Gravely M.D.   On: 09/07/2024 20:27   DG Pelvis 1-2 Views Result Date: 09/07/2024 CLINICAL DATA:  Unwitnessed fall with deformity to the left hip. EXAM: LEFT FEMUR 2 VIEWS; PELVIS - 1-2 VIEW COMPARISON:  None Available. FINDINGS: There is an acute comminuted fracture of the inter trochanteric left proximal femur with mild varus angulation and impaction of the fracture fragments. No dislocation at the hip joint. Degenerative changes in the lower lumbar spine and in both hips. Pelvis appears intact. SI joints and symphysis pubis are not displaced. Postoperative changes in the lower  lumbar spine. Vascular calcifications. Midshaft and distal femur appear intact. IMPRESSION: Acute comminuted fracture of the inter trochanteric region of the left proximal femur with mild varus angulation and impaction. Electronically Signed   By: Elsie Gravely M.D.   On: 09/07/2024 20:27     Medical Consultants:   None.   Subjective:    Doris Wyatt pain controlled has not had a bowel movement.  Objective:    Vitals:   09/08/24 1819 09/08/24 2015 09/09/24 0139 09/09/24 0555  BP: (!) 150/76 129/64 (!) 133/55 124/60  Pulse: 77 73 71 63  Resp: 18 15 16 16   Temp: 99.3 F (37.4 C) (!) 97.5 F (36.4 C) 98 F (36.7 C) (!) 97.5 F (36.4 C)  TempSrc: Oral Axillary Oral Axillary  SpO2: 91% 99% 99% 99%  Weight:      Height:       SpO2: 99 % O2 Flow Rate (L/min): 2 L/min   Intake/Output Summary (Last 24 hours) at 09/09/2024 0904 Last data filed at 09/09/2024 0600 Gross per 24 hour  Intake 1804.67 ml  Output 835 ml  Net 969.67 ml   Filed Weights   09/08/24 0632  Weight: 44.3 kg    Exam: General exam: In no acute distress. Respiratory system: Good air movement and clear to auscultation. Cardiovascular system: S1 & S2 heard, RRR. No JVD. Gastrointestinal system: Abdomen is nondistended, soft and nontender.  Extremities: No pedal edema. Skin: No rashes, lesions or ulcers Psychiatry: No Judgment and insight of medical condition.   Data Reviewed:    Labs: Basic Metabolic Panel: Recent Labs  Lab 09/07/24 1937 09/08/24 0043 09/08/24 1709  NA 141 140  --   K 3.8 4.5  --   CL 100 101  --   CO2 28 29  --   GLUCOSE 271* 379*  --  BUN 18 19  --   CREATININE 0.54 0.57 0.60  CALCIUM  9.4 8.9  --    GFR Estimated Creatinine Clearance: 39.9 mL/min (by C-G formula based on SCr of 0.6 mg/dL). Liver Function Tests: No results for input(s): AST, ALT, ALKPHOS, BILITOT, PROT, ALBUMIN in the last 168 hours. No results for input(s): LIPASE,  AMYLASE in the last 168 hours. No results for input(s): AMMONIA in the last 168 hours. Coagulation profile Recent Labs  Lab 09/08/24 0837  INR 1.1   COVID-19 Labs  No results for input(s): DDIMER, FERRITIN, LDH, CRP in the last 72 hours.  No results found for: SARSCOV2NAA  CBC: Recent Labs  Lab 09/07/24 1937 09/08/24 0043 09/08/24 1709  WBC 9.4 16.5* 16.4*  NEUTROABS 6.4  --   --   HGB 13.4 12.5 11.5*  HCT 42.3 38.5 36.4  MCV 95.9 94.4 96.8  PLT 228 214 231   Cardiac Enzymes: No results for input(s): CKTOTAL, CKMB, CKMBINDEX, TROPONINI in the last 168 hours. BNP (last 3 results) No results for input(s): PROBNP in the last 8760 hours. CBG: Recent Labs  Lab 09/08/24 1118 09/08/24 2017 09/09/24 0030 09/09/24 0414 09/09/24 0758  GLUCAP 173* 257* 251* 191* 191*   D-Dimer: No results for input(s): DDIMER in the last 72 hours. Hgb A1c: No results for input(s): HGBA1C in the last 72 hours. Lipid Profile: No results for input(s): CHOL, HDL, LDLCALC, TRIG, CHOLHDL, LDLDIRECT in the last 72 hours. Thyroid  function studies: No results for input(s): TSH, T4TOTAL, T3FREE, THYROIDAB in the last 72 hours.  Invalid input(s): FREET3 Anemia work up: No results for input(s): VITAMINB12, FOLATE, FERRITIN, TIBC, IRON, RETICCTPCT in the last 72 hours. Sepsis Labs: Recent Labs  Lab 09/07/24 1937 09/08/24 0043 09/08/24 1709  WBC 9.4 16.5* 16.4*   Microbiology Recent Results (from the past 240 hours)  Surgical pcr screen     Status: None   Collection Time: 09/08/24  3:29 AM   Specimen: Nasal Mucosa; Nasal Swab  Result Value Ref Range Status   MRSA, PCR NEGATIVE NEGATIVE Final   Staphylococcus aureus NEGATIVE NEGATIVE Final    Comment: (NOTE) The Xpert SA Assay (FDA approved for NASAL specimens in patients 64 years of age and older), is one component of a comprehensive surveillance program. It is not intended  to diagnose infection nor to guide or monitor treatment. Performed at [facility], [address] [place], [place] [address]      Medications:    acetaminophen   1,000 mg Oral Q6H   atorvastatin   40 mg Oral Daily   divalproex   125 mg Oral BID   docusate sodium   100 mg Oral BID   donepezil   23 mg Oral QHS   enoxaparin (LOVENOX) injection  30 mg Subcutaneous Q24H   feeding supplement (GLUCERNA SHAKE)  237 mL Oral BID BM   insulin  aspart  0-9 Units Subcutaneous Q4H   irbesartan   75 mg Oral Daily   melatonin  3 mg Oral QHS   memantine   10 mg Oral BID   multivitamin with minerals  1 tablet Oral Daily   polyethylene glycol  17 g Oral BID   Continuous Infusions:  sodium chloride  40 mL/hr at 09/08/24 1853    ceFAZolin  (ANCEF ) IV 2 g (09/09/24 0500)      LOS: 2 days   Erle Odell Castor  [facility]  09/09/2024, 9:04 AM

## 2024-09-09 NOTE — Evaluation (Signed)
 Physical Therapy Evaluation Patient Details Name: Doris Wyatt MRN: [id number] DOB: [birth year] Today's Date: 09/09/2024  History of Present Illness  Pt s/p fall with L hip fx and now s/p intrameullary rod insertion.  Pt with hx of COPD, MDD, Alzheimers, DM, back surgery, and osteoporosis  Clinical Impression  Pt admitted as above and presenting with functional mobility limitations 2* generalized weakness, decreased L LE strength/ROM, balance deficits, post op pain, and dementia related cognitive deficits.  This date, pt requires increased time and significant assist of two for all basic mobility tasks and patient will benefit from continued inpatient follow up therapy, <3 hours/day to maximize IND and safety prior to return home with assist of dtr.        If plan is discharge home, recommend the following: Two people to help with walking and/or transfers;Two people to help with bathing/dressing/bathroom;Assist for transportation;Help with stairs or ramp for entrance;Assistance with cooking/housework   Can travel by private vehicle   No    Equipment Recommendations Other (comment) (TBD)  Recommendations for Other Services       Functional Status Assessment Patient has had a recent decline in their functional status and demonstrates the ability to make significant improvements in function in a reasonable and predictable amount of time.     Precautions / Restrictions Precautions Precautions: Fall Restrictions Weight Bearing Restrictions Per Provider Order: Yes LLE Weight Bearing Per Provider Order: Weight bearing as tolerated      Mobility  Bed Mobility Overal bed mobility: Needs Assistance Bed Mobility: Supine to Sit, Sit to Supine     Supine to sit: Max assist, Total assist, +2 for physical assistance, +2 for safety/equipment Sit to supine: Max assist, Total assist, +2 for physical assistance, +2 for safety/equipment   General bed mobility comments: INcreased time,  extensive use of bed rails and bed mat    Transfers Overall transfer level: Needs assistance Equipment used: Rolling walker (2 wheels) Transfers: Sit to/from Stand, Bed to chair/wheelchair/BSC Sit to Stand: Mod assist, +2 physical assistance, +2 safety/equipment, From elevated surface   Step pivot transfers: Mod assist, +2 physical assistance, +2 safety/equipment       General transfer comment: Significant assist to bring wt up and fwd and to balance in standing with RW; Step pvt with RW bed to [facility]    Ambulation/Gait               General Gait Details: Step pvt to [facility] only  Stairs            Wheelchair Mobility     Tilt Bed    Modified Rankin (Stroke Patients Only)       Balance Overall balance assessment: Needs assistance Sitting-balance support: No upper extremity supported, Feet supported Sitting balance-Leahy Scale: Fair     Standing balance support: Bilateral upper extremity supported Standing balance-Leahy Scale: Poor                               Pertinent Vitals/Pain Pain Assessment Pain Assessment: Faces Faces Pain Scale: Hurts whole lot Pain Location: L LE Pain Descriptors / Indicators: Grimacing, Guarding, Moaning Pain Intervention(s): Limited activity within patient's tolerance, Monitored during session, Premedicated before session    Home Living Family/patient expects to be discharged to:: Private residence Living Arrangements: Children Available Help at Discharge: Available 24 hours/day Type of Home: House Home Access: Stairs to enter   [employer] of Steps: 1 small threshold step  Home Equipment: Agricultural consultant (2 wheels)      Prior Function Prior Level of Function : Independent/Modified Independent             Mobility Comments: Pt was IND ambulator at home - per grandaughter they would bring a walker along in case she needed it       Extremity/Trunk Assessment   Upper Extremity  Assessment Upper Extremity Assessment: Generalized weakness    Lower Extremity Assessment Lower Extremity Assessment: Generalized weakness;LLE deficits/detail LLE: Unable to fully assess due to pain    Cervical / Trunk Assessment Cervical / Trunk Assessment: Kyphotic  Communication   Communication Communication: No apparent difficulties    Cognition Arousal: Alert Behavior During Therapy: Anxious, Flat affect   PT - Cognitive impairments: History of cognitive impairments                       PT - Cognition Comments: Dementia - limited awareness of situation Following commands: Impaired Following commands impaired: Follows one step commands inconsistently     Cueing Cueing Techniques: Verbal cues, Gestural cues     General Comments      Exercises     Assessment/Plan    PT Assessment Patient needs continued PT services  PT Problem List Decreased strength;Decreased range of motion;Decreased activity tolerance;Decreased balance;Decreased mobility;Decreased knowledge of use of DME;Pain       PT Treatment Interventions DME instruction;Gait training;Stair training;Functional mobility training;Therapeutic activities;Therapeutic exercise;Patient/family education    PT Goals (Current goals can be found in the Care Plan section)  Acute Rehab PT Goals Patient Stated Goal: Less pain PT Goal Formulation: Patient unable to participate in goal setting Time For Goal Achievement: 09/23/24 Potential to Achieve Goals: Fair    Frequency Min 5X/week     Co-evaluation               AM-PAC PT 6 Clicks Mobility  Outcome Measure Help needed turning from your back to your side while in a flat bed without using bedrails?: Total Help needed moving from lying on your back to sitting on the side of a flat bed without using bedrails?: Total Help needed moving to and from a bed to a chair (including a wheelchair)?: Total Help needed standing up from a chair using your  arms (e.g., wheelchair or bedside chair)?: Total Help needed to walk in hospital room?: Total Help needed climbing 3-5 steps with a railing? : Total 6 Click Score: 6    End of Session Equipment Utilized During Treatment: Gait belt Activity Tolerance: Patient limited by fatigue;Patient limited by pain Patient left: Other (comment) [facility]) Nurse Communication: Mobility status PT Visit Diagnosis: Difficulty in walking, not elsewhere classified (R26.2);Muscle weakness (generalized) (M62.81);History of falling (Z91.81);Pain Pain - Right/Left: Left Pain - part of body: Hip;Leg    Time: 8572-8557 PT Time Calculation (min) (ACUTE ONLY): 15 min   Charges:   PT Evaluation $PT Eval Moderate Complexity: 1 Mod   PT General Charges $$ ACUTE PT VISIT: 1 Visit         [facility] PT Acute Rehabilitation Services Office [phone]   Yudit Modesitt 09/09/2024, 3:50 PM

## 2024-09-09 NOTE — Progress Notes (Signed)
 Physical Therapy Treatment Patient Details Name: Doris Wyatt MRN: [id number] DOB: Aug 24, 1946 Today's Date: 09/09/2024   History of Present Illness Pt s/p fall with L hip fx and now s/p intrameullary rod insertion.  Pt with hx of COPD, MDD, Alzheimers, DM, back surgery, and osteoporosis    PT Comments  Pt continues limited by pain and limited insight into situation.  Pt requires significant assist of 2 for safe performance of all basic mobility tasks and Patient will benefit from continued inpatient follow up therapy, <3 hours/day.    If plan is discharge home, recommend the following: Two people to help with walking and/or transfers;Two people to help with bathing/dressing/bathroom;Assist for transportation;Help with stairs or ramp for entrance;Assistance with cooking/housework   Can travel by private vehicle     No  Equipment Recommendations  Other (comment)    Recommendations for Other Services       Precautions / Restrictions Precautions Precautions: Fall Restrictions Weight Bearing Restrictions Per Provider Order: Yes LLE Weight Bearing Per Provider Order: Weight bearing as tolerated     Mobility  Bed Mobility Overal bed mobility: Needs Assistance Bed Mobility: Sit to Supine     Supine to sit: Max assist, Total assist, +2 for physical assistance, +2 for safety/equipment Sit to supine: Max assist, Total assist, +2 for physical assistance, +2 for safety/equipment   General bed mobility comments: INcreased time, extensive use of bed rails and bed pad    Transfers Overall transfer level: Needs assistance Equipment used: Rolling walker (2 wheels) Transfers: Sit to/from Stand, Bed to chair/wheelchair/BSC Sit to Stand: Mod assist, +2 physical assistance, +2 safety/equipment, From elevated surface   Step pivot transfers: Mod assist, +2 physical assistance, +2 safety/equipment       General transfer comment: Significant assist to bring wt up and fwd and to  balance in standing with RW; Step pvt with RW BSC to bedside    Ambulation/Gait               General Gait Details: Step pvt to beside only   Stairs             Wheelchair Mobility     Tilt Bed    Modified Rankin (Stroke Patients Only)       Balance Overall balance assessment: Needs assistance Sitting-balance support: No upper extremity supported, Feet supported Sitting balance-Leahy Scale: Fair     Standing balance support: Bilateral upper extremity supported Standing balance-Leahy Scale: Poor                              Communication Communication Communication: No apparent difficulties  Cognition Arousal: Alert Behavior During Therapy: Anxious, Flat affect   PT - Cognitive impairments: History of cognitive impairments                       PT - Cognition Comments: Dementia - limited awareness of situation Following commands: Impaired Following commands impaired: Follows one step commands inconsistently    Cueing Cueing Techniques: Verbal cues, Gestural cues  Exercises      General Comments        Pertinent Vitals/Pain Pain Assessment Pain Assessment: Faces Faces Pain Scale: Hurts whole lot Pain Location: L LE Pain Descriptors / Indicators: Grimacing, Guarding, Moaning Pain Intervention(s): Limited activity within patient's tolerance, Monitored during session, Premedicated before session    Home Living Family/patient expects to be discharged to:: Private residence Living Arrangements: Children Available Help  at Discharge: Available 24 hours/day Type of Home: House Home Access: Stairs to enter   [employer] of Steps: 1 small threshold step     Home Equipment: Agricultural consultant (2 wheels)      Prior Function            PT Goals (current goals can now be found in the care plan section) Acute Rehab PT Goals Patient Stated Goal: Less pain PT Goal Formulation: Patient unable to participate in goal  setting Time For Goal Achievement: 09/23/24 Potential to Achieve Goals: Fair Progress towards PT goals: Progressing toward goals    Frequency    Min 5X/week      PT Plan      Co-evaluation              AM-PAC PT 6 Clicks Mobility   Outcome Measure  Help needed turning from your back to your side while in a flat bed without using bedrails?: Total Help needed moving from lying on your back to sitting on the side of a flat bed without using bedrails?: Total Help needed moving to and from a bed to a chair (including a wheelchair)?: Total Help needed standing up from a chair using your arms (e.g., wheelchair or bedside chair)?: Total Help needed to walk in hospital room?: Total Help needed climbing 3-5 steps with a railing? : Total 6 Click Score: 6    End of Session Equipment Utilized During Treatment: Gait belt Activity Tolerance: Patient limited by fatigue;Patient limited by pain Patient left: in bed;with nursing/sitter in room;with family/visitor present Nurse Communication: Mobility status PT Visit Diagnosis: Difficulty in walking, not elsewhere classified (R26.2);Muscle weakness (generalized) (M62.81);History of falling (Z91.81);Pain Pain - Right/Left: Left Pain - part of body: Hip;Leg     Time: 8548-8497 PT Time Calculation (min) (ACUTE ONLY): 11 min  Charges:    $Therapeutic Activity: 8-22 mins PT General Charges $$ ACUTE PT VISIT: 1 Visit                     [facility] PT Acute Rehabilitation Services Office [phone]    Doris Wyatt 09/09/2024, 4:01 PM

## 2024-09-10 DIAGNOSIS — S72142A Displaced intertrochanteric fracture of left femur, initial encounter for closed fracture: Secondary | ICD-10-CM | POA: Diagnosis not present

## 2024-09-10 LAB — HEMOGLOBIN A1C
Hgb A1c MFr Bld: 9 % — ABNORMAL HIGH (ref 4.8–5.6)
Mean Plasma Glucose: 211.6 mg/dL

## 2024-09-10 LAB — GLUCOSE, CAPILLARY
Glucose-Capillary: 188 mg/dL — ABNORMAL HIGH (ref 70–99)
Glucose-Capillary: 191 mg/dL — ABNORMAL HIGH (ref 70–99)
Glucose-Capillary: 206 mg/dL — ABNORMAL HIGH (ref 70–99)
Glucose-Capillary: 216 mg/dL — ABNORMAL HIGH (ref 70–99)
Glucose-Capillary: 225 mg/dL — ABNORMAL HIGH (ref 70–99)
Glucose-Capillary: 248 mg/dL — ABNORMAL HIGH (ref 70–99)

## 2024-09-10 LAB — CBC
HCT: 28.5 % — ABNORMAL LOW (ref 36.0–46.0)
Hemoglobin: 8.9 g/dL — ABNORMAL LOW (ref 12.0–15.0)
MCH: 30.5 pg (ref 26.0–34.0)
MCHC: 31.2 g/dL (ref 30.0–36.0)
MCV: 97.6 fL (ref 80.0–100.0)
Platelets: 175 K/uL (ref 150–400)
RBC: 2.92 MIL/uL — ABNORMAL LOW (ref 3.87–5.11)
RDW: 13.1 % (ref 11.5–15.5)
WBC: 12.3 K/uL — ABNORMAL HIGH (ref 4.0–10.5)
nRBC: 0 % (ref 0.0–0.2)

## 2024-09-10 MED ORDER — INSULIN ASPART 100 UNIT/ML IJ SOLN
0.0000 [IU] | Freq: Three times a day (TID) | INTRAMUSCULAR | Status: DC
  Administered 2024-09-10 – 2024-09-11 (×4): 5 [IU] via SUBCUTANEOUS
  Administered 2024-09-11 – 2024-09-12 (×2): 8 [IU] via SUBCUTANEOUS
  Administered 2024-09-12 (×2): 5 [IU] via SUBCUTANEOUS
  Administered 2024-09-13 (×2): 8 [IU] via SUBCUTANEOUS
  Administered 2024-09-13: 3 [IU] via SUBCUTANEOUS
  Administered 2024-09-14 (×2): 8 [IU] via SUBCUTANEOUS

## 2024-09-10 MED ORDER — PROCHLORPERAZINE EDISYLATE 10 MG/2ML IJ SOLN
10.0000 mg | INTRAMUSCULAR | Status: DC | PRN
  Administered 2024-09-10: 10 mg via INTRAVENOUS
  Filled 2024-09-10: qty 2

## 2024-09-10 MED ORDER — INSULIN ASPART 100 UNIT/ML IJ SOLN
0.0000 [IU] | Freq: Every day | INTRAMUSCULAR | Status: DC
  Administered 2024-09-10: 2 [IU] via SUBCUTANEOUS
  Administered 2024-09-11: 3 [IU] via SUBCUTANEOUS
  Administered 2024-09-12 – 2024-09-13 (×2): 2 [IU] via SUBCUTANEOUS

## 2024-09-10 MED ORDER — MELATONIN 3 MG PO TABS
6.0000 mg | ORAL_TABLET | Freq: Every day | ORAL | Status: DC
  Administered 2024-09-10 – 2024-09-13 (×4): 6 mg via ORAL
  Filled 2024-09-10 (×4): qty 2

## 2024-09-10 MED ORDER — INSULIN ASPART 100 UNIT/ML IJ SOLN
3.0000 [IU] | Freq: Three times a day (TID) | INTRAMUSCULAR | Status: DC
  Administered 2024-09-10 – 2024-09-13 (×8): 3 [IU] via SUBCUTANEOUS

## 2024-09-10 NOTE — Plan of Care (Signed)
   Problem: Health Behavior/Discharge Planning: Goal: Ability to manage health-related needs will improve Outcome: Progressing   Problem: Pain Managment: Goal: General experience of comfort will improve and/or be controlled Outcome: Progressing   Problem: Safety: Goal: Ability to remain free from injury will improve Outcome: Progressing   Problem: Skin Integrity: Goal: Risk for impaired skin integrity will decrease Outcome: Progressing

## 2024-09-10 NOTE — Progress Notes (Signed)
 [facility] PROGRESS NOTE    Progress Note  Doris Wyatt  FMW:[id number] DOB: 1945/12/23 DOA: 09/07/2024 PCP: [name], MD     Brief Narrative:   Doris Wyatt is an 78 y.o. female past medical history significant for dementia, COPD diabetes mellitus type 2 depression anxiety comes into the ED for left hip pain after a unwitnessed fall, did not hit her head.  Imaging showed an acute left intertrochanteric proximal fracture   Assessment/Plan:   Closed comminuted intertrochanteric fracture of proximal femur, left, initial encounter [facility]) Orthopedic surgery was consulted, t he is status post intramedullary nailing of the femur. PT OT eval is pending. There is been a drop in hemoglobin from 12->9, likely postop. Family would like for her to go to a rehab facility temporarily   Hypertension associated with diabetes (HCC) Blood pressure well-controlled current regimen.  COPD (chronic obstructive pulmonary disease) Continue inhalers.  Hyperlipidemia associated with type 2 diabetes mellitus (HCC) Continue statins.  Type 2 diabetes mellitus (HCC) Continue sliding scale insulin .  Major neurocognitive disorder due to Alzheimer's disease Continue current medications Aricept  Namenda  Depakote . High risk of delirium and aspiration.  Blood loss anemia postop: Likely due to postop asymptomatic currently 9.   DVT prophylaxis: lovenox Family Communication: Daughter Status is: Inpatient Remains inpatient appropriate because: Acute left hip fracture    Code Status:     Code Status Orders  (From admission, onward)           Start     Ordered   09/07/24 2344  Full code  Continuous       Question:  By:  Answer:  Consent: discussion documented in EHR   09/07/24 2344           Code Status History     Date Active Date Inactive Code Status Order ID Comments User Context   10/31/2017 1517 11/04/2017 1557 Full Code [id number]  Alm Maxwell LABOR, MD  Inpatient         IV Access:   Peripheral IV   Procedures and diagnostic studies:   ECHOCARDIOGRAM COMPLETE Result Date: 09/08/2024    ECHOCARDIOGRAM REPORT   Patient Name:   Doris Wyatt Date of Exam: 09/08/2024 Medical Rec #:  [id number]           Height:       65.0 in Accession #:    [id number]          Weight:       97.7 lb Date of Birth:  [birth year]           BSA:          1.459 m Patient Age:    77 years            BP:           132/56 mmHg Patient Gender: F                   HR:           89 bpm. Exam Location:  Inpatient Procedure: 2D Echo, Cardiac Doppler and Color Doppler (Both Spectral and Color            Flow Doppler were utilized during procedure). Indications:    Dyspnea  History:        Patient has no prior history of Echocardiogram examinations.                 COPD; Risk Factors:Hypertension and Diabetes.  Sonographer:  Philomena Daring Referring Phys: [id number] Earnie Bechard FELIZ ORTIZ IMPRESSIONS  1. Left ventricular ejection fraction, by estimation, is 60 to 65%. The left ventricle has normal function. The left ventricle has no regional wall motion abnormalities. Left ventricular diastolic parameters were normal.  2. Right ventricular systolic function is normal. The right ventricular size is normal. There is normal pulmonary artery systolic pressure.  3. The mitral valve is grossly normal. Trivial mitral valve regurgitation. No evidence of mitral stenosis.  4. The aortic valve was not well visualized. There is mild calcification of the aortic valve. There is mild thickening of the aortic valve. Aortic valve regurgitation is not visualized. No aortic stenosis is present.  5. The inferior vena cava is normal in size with greater than 50% respiratory variability, suggesting right atrial pressure of 3 mmHg. Comparison(s): No prior Echocardiogram. Conclusion(s)/Recommendation(s): Normal biventricular function without evidence of hemodynamically significant valvular heart disease. FINDINGS   Left Ventricle: Left ventricular ejection fraction, by estimation, is 60 to 65%. The left ventricle has normal function. The left ventricle has no regional wall motion abnormalities. The left ventricular internal cavity size was normal in size. There is  no left ventricular hypertrophy. Left ventricular diastolic parameters were normal. Right Ventricle: The right ventricular size is normal. No increase in right ventricular wall thickness. Right ventricular systolic function is normal. There is normal pulmonary artery systolic pressure. The tricuspid regurgitant velocity is 2.46 m/s, and  with an assumed right atrial pressure of 3 mmHg, the estimated right ventricular systolic pressure is 27.2 mmHg. Left Atrium: Left atrial size was normal in size. Right Atrium: Right atrial size was normal in size. Pericardium: There is no evidence of pericardial effusion. Mitral Valve: The mitral valve is grossly normal. There is mild thickening of the mitral valve leaflet(s). Mild to moderate mitral annular calcification. Trivial mitral valve regurgitation. No evidence of mitral valve stenosis. Tricuspid Valve: The tricuspid valve is normal in structure. Tricuspid valve regurgitation is trivial. No evidence of tricuspid stenosis. Aortic Valve: The aortic valve was not well visualized. There is mild calcification of the aortic valve. There is mild thickening of the aortic valve. Aortic valve regurgitation is not visualized. No aortic stenosis is present. Pulmonic Valve: The pulmonic valve was not well visualized. Pulmonic valve regurgitation is not visualized. No evidence of pulmonic stenosis. Aorta: The aortic root is normal in size and structure. Venous: The inferior vena cava is normal in size with greater than 50% respiratory variability, suggesting right atrial pressure of 3 mmHg. IAS/Shunts: The atrial septum is grossly normal.  LEFT VENTRICLE PLAX 2D LVIDd:         3.80 cm   Diastology LVIDs:         2.60 cm   LV e' medial:     8.05 cm/s LV PW:         0.90 cm   LV E/e' medial:  10.4 LV IVS:        0.90 cm   LV e' lateral:   8.81 cm/s LVOT diam:     2.00 cm   LV E/e' lateral: 9.5 LV SV:         56 LV SV Index:   39 LVOT Area:     3.14 cm  RIGHT VENTRICLE             IVC RV S prime:     10.90 cm/s  IVC diam: 1.60 cm TAPSE (M-mode): 2.3 cm LEFT ATRIUM  Index        RIGHT ATRIUM           Index LA Vol (A2C): 32.1 ml 22.01 ml/m  RA Area:     10.60 cm LA Vol (A4C): 18.9 ml 12.96 ml/m  RA Volume:   23.10 ml  15.84 ml/m  AORTIC VALVE LVOT Vmax:   77.10 cm/s LVOT Vmean:  51.400 cm/s LVOT VTI:    0.179 m  AORTA Ao Root diam: 2.40 cm MITRAL VALVE               TRICUSPID VALVE MV Area (PHT): 3.38 cm    TR Peak grad:   24.2 mmHg MV E velocity: 84.00 cm/s  TR Vmax:        246.00 cm/s MV A velocity: 71.60 cm/s MV E/A ratio:  1.17        SHUNTS                            Systemic VTI:  0.18 m                            Systemic Diam: 2.00 cm Doris Bruckner MD Electronically signed by Doris Bruckner MD Signature Date/Time: 09/08/2024/6:27:09 PM    Final    DG FEMUR MIN 2 VIEWS LEFT Result Date: 09/08/2024 CLINICAL DATA:  Elective surgery. EXAM: LEFT FEMUR 2 VIEWS COMPARISON:  Preoperative imaging. FINDINGS: Ten fluoroscopic spot views of the left femur submitted from the operating room. Femoral intramedullary nail with trans trochanteric and distal locking screw fixation traverse proximal femur fracture. Fluoroscopy time 31 seconds. Dose 3.9139 mGy. IMPRESSION: Intraoperative fluoroscopy during left femur ORIF. Electronically Signed   By: Andrea Gasman M.D.   On: 09/08/2024 17:23   DG C-Arm 1-60 Min-No Report Result Date: 09/08/2024 Fluoroscopy was utilized by the requesting physician.  No radiographic interpretation.     Medical Consultants:   None.   Subjective:    Doris Wyatt Staff pain not controlled  Objective:    Vitals:   09/09/24 1203 09/09/24 1458 09/09/24 2315 09/10/24 0611  BP:  (!)  125/56 118/64 (!) 138/47  Pulse:  81 70 89  Resp:  14 16 16   Temp: 97.6 F (36.4 C) (!) 97.5 F (36.4 C) (!) 97.5 F (36.4 C) 98 F (36.7 C)  TempSrc: Oral Oral    SpO2:  100% 100% 96%  Weight:      Height:       SpO2: 96 % O2 Flow Rate (L/min): 2 L/min   Intake/Output Summary (Last 24 hours) at 09/10/2024 0743 Last data filed at 09/10/2024 0612 Gross per 24 hour  Intake 1434.67 ml  Output 650 ml  Net 784.67 ml   Filed Weights   09/08/24 9367  Weight: 44.3 kg    Exam: General exam: In no acute distress. Respiratory system: Good air movement and clear to auscultation. Cardiovascular system: S1 & S2 heard, RRR. No JVD. Gastrointestinal system: Abdomen is nondistended, soft and nontender.  Extremities: No pedal edema. Skin: No rashes, lesions or ulcers Psychiatry: Judgement and insight appear normal. Mood & affect appropriate. Data Reviewed:    Labs: Basic Metabolic Panel: Recent Labs  Lab 09/07/24 1937 09/08/24 0043 09/08/24 1709 09/09/24 0936  NA 141 140  --  139  K 3.8 4.5  --  4.9  CL 100 101  --  101  CO2 28 29  --  29  GLUCOSE 271* 379*  --  191*  BUN 18 19  --  25*  CREATININE 0.54 0.57 0.60 0.70  CALCIUM  9.4 8.9  --  9.0   GFR Estimated Creatinine Clearance: 39.9 mL/min (by C-G formula based on SCr of 0.7 mg/dL). Liver Function Tests: No results for input(s): AST, ALT, ALKPHOS, BILITOT, PROT, ALBUMIN in the last 168 hours. No results for input(s): LIPASE, AMYLASE in the last 168 hours. No results for input(s): AMMONIA in the last 168 hours. Coagulation profile Recent Labs  Lab 09/08/24 0837  INR 1.1   COVID-19 Labs  No results for input(s): DDIMER, FERRITIN, LDH, CRP in the last 72 hours.  No results found for: SARSCOV2NAA  CBC: Recent Labs  Lab 09/07/24 1937 09/08/24 0043 09/08/24 1709 09/09/24 0936 09/10/24 0326  WBC 9.4 16.5* 16.4* 11.7* 12.3*  NEUTROABS 6.4  --   --   --   --   HGB 13.4 12.5  11.5* 9.5* 8.9*  HCT 42.3 38.5 36.4 31.0* 28.5*  MCV 95.9 94.4 96.8 99.0 97.6  PLT 228 214 231 175 175   Cardiac Enzymes: No results for input(s): CKTOTAL, CKMB, CKMBINDEX, TROPONINI in the last 168 hours. BNP (last 3 results) No results for input(s): PROBNP in the last 8760 hours. CBG: Recent Labs  Lab 09/09/24 1647 09/09/24 2036 09/10/24 0002 09/10/24 0408 09/10/24 0728  GLUCAP 272* 290* 191* 188* 216*   D-Dimer: No results for input(s): DDIMER in the last 72 hours. Hgb A1c: No results for input(s): HGBA1C in the last 72 hours. Lipid Profile: No results for input(s): CHOL, HDL, LDLCALC, TRIG, CHOLHDL, LDLDIRECT in the last 72 hours. Thyroid  function studies: No results for input(s): TSH, T4TOTAL, T3FREE, THYROIDAB in the last 72 hours.  Invalid input(s): FREET3 Anemia work up: No results for input(s): VITAMINB12, FOLATE, FERRITIN, TIBC, IRON, RETICCTPCT in the last 72 hours. Sepsis Labs: Recent Labs  Lab 09/08/24 0043 09/08/24 1709 09/09/24 0936 09/10/24 0326  WBC 16.5* 16.4* 11.7* 12.3*   Microbiology Recent Results (from the past 240 hours)  Surgical pcr screen     Status: None   Collection Time: 09/08/24  3:29 AM   Specimen: Nasal Mucosa; Nasal Swab  Result Value Ref Range Status   MRSA, PCR NEGATIVE NEGATIVE Final   Staphylococcus aureus NEGATIVE NEGATIVE Final    Comment: (NOTE) The Xpert SA Assay (FDA approved for NASAL specimens in patients 10 years of age and older), is one component of a comprehensive surveillance program. It is not intended to diagnose infection nor to guide or monitor treatment. Performed at [facility], [address] [place], [place] [address]      Medications:    atorvastatin   40 mg Oral Daily   divalproex   125 mg Oral BID   docusate sodium   100 mg Oral BID   donepezil   23 mg Oral QHS   enoxaparin (LOVENOX) injection  30 mg Subcutaneous Q24H   feeding  supplement (GLUCERNA SHAKE)  237 mL Oral BID BM   insulin  aspart  0-9 Units Subcutaneous Q4H   irbesartan   75 mg Oral Daily   melatonin  3 mg Oral QHS   memantine   10 mg Oral BID   multivitamin with minerals  1 tablet Oral Daily   polyethylene glycol  17 g Oral BID   Continuous Infusions:      LOS: 3 days   Erle Odell Castor  [facility]  09/10/2024, 7:43 AM

## 2024-09-10 NOTE — TOC PASRR Note (Signed)
 [facility] IP TOC PASRR NOTE  30 Day PASRR Note   Patient Details  Name: Doris Wyatt Date of Birth: 06/04/1946   Transition of Care [facility]) CM/SW Contact:    Sheri ONEIDA Sharps, LCSW Phone Number: 09/10/2024, 1:47 PM  To Whom It May Concern:  Please be advised that this patient will require a short-term nursing home stay - anticipated 30 days or less for rehabilitation and strengthening.   The plan is for return home.

## 2024-09-10 NOTE — Progress Notes (Signed)
 Physical Therapy Treatment Patient Details Name: Doris Wyatt MRN: [id number] DOB: Jul 24, 1946 Today's Date: 09/10/2024   History of Present Illness Pt s/p fall with L hip fx and now s/p intrameullary rod insertion.  Pt with hx of COPD, MDD, Alzheimers, DM, back surgery, and osteoporosis    PT Comments  POD # 2 Cognition Comments: sleepy/groggy responds to name and pain briefly.  Eyes shut most of session.  Plus resistant/fearful/confused.  Granddaughters present during session attempting to offer comfort. Assisted OOB was very difficult.  General bed mobility comments: all bed mobility was Total Assist + 2 with slow/careful movement using bed pad to complete as well as full support L LE.  Assisted to EOB, Pt grimacing with pain/fear/anxiety.  Once seated upright EOB, Pt was able to static sit x 3 min at Contact Guard Assist.  Pt unbale to move L LE in proper 90/90 position.  Also present with increased RR.  Remained on 3 lts.  Back to bed was more difficult using bed pad to offer 100% support.  Pt placed in RIGHT fetal position with multiple pillows for comfort. General transfer comment: Pt resistant with increased fear/anxiety EOB, so used Bear Hug to transfer from elevated bed to [facility] with a second assist in back to complete 1/4 pivot.  Repeat VC's in attempt to get Pt to stand, weight bear and pivot.  Pt offered no assist present with some resistance and max fear.  Pt sat on BSC x 6 min.  Trial removed oxygen and sats decreased to low 70s.  Reapplied 4 lts for recovery back to 96%.  Pt remained confused.  Alertness did not increase with activity.  Eyes shut most of session.  Pt mostly grimacing with pain.  Pt assisted back to bed same tech Bera Hug and + 2 Total Assist to scoot to back of bed and using bed pad pivot to supine with full support to upper and lower body.  Assisted with rolling to her right, and positioned with multiple pillows in RIGHT fetal position.  Take a break from supine.  Returned to 3 lts nasal and applied purewick. Prior Pt was living home with family assist for ADL's/meals/meds.  Pt was amb and tripped over a small step from the garage.   LPT has rec Pt will need ST Rehab at SNF to address mobility and functional decline prior to safely returning home.    If plan is discharge home, recommend the following: Two people to help with walking and/or transfers;Two people to help with bathing/dressing/bathroom;Assist for transportation;Help with stairs or ramp for entrance;Assistance with cooking/housework   Can travel by private vehicle     No  Equipment Recommendations       Recommendations for Other Services       Precautions / Restrictions Precautions Precautions: Fall Precaution/Restrictions Comments: Hx Alzheimers, COPD Restrictions Weight Bearing Restrictions Per Provider Order: No LLE Weight Bearing Per Provider Order: Weight bearing as tolerated     Mobility  Bed Mobility Overal bed mobility: Needs Assistance Bed Mobility: Rolling, Supine to Sit, Sit to Supine Rolling: Total assist, +2 for physical assistance, +2 for safety/equipment   Supine to sit: Total assist, +2 for physical assistance, +2 for safety/equipment Sit to supine: Total assist, +2 for physical assistance, +2 for safety/equipment   General bed mobility comments: all bed mobility was Total Assist + 2 with slow/careful movement using bed pad to complete as well as full support L LE.  Assisted to EOB, Pt grimacing with pain/fear/anxiety.  Once seated upright EOB, Pt was able to static sit x 3 min at Contact Guard Assist.  Pt unbale to move L LE in proper 90/90 position.  Also present with increased RR.  Remained on 3 lts.  Back to bed was more difficult using bed pad to offer 100% support.  Pt placed in RIGHT fetal position with multiple pillows for comfort.    Transfers Overall transfer level: Needs assistance Equipment used: None Transfers: Bed to chair/wheelchair/BSC    Stand pivot transfers: Total assist, +2 physical assistance, +2 safety/equipment, From elevated surface         General transfer comment: Pt resistant with increased fear/anxiety EOB, so used Bear Hug to transfer from elevated bed to [facility] with a second assist in back to complete 1/4 pivot.  Repeat VC's in attempt to get Pt to stand, weight bear and pivot.  Pt offered no assist present with some resistance and max fear.  Pt sat on BSC x 6 min.  Trial removed oxygen and sats decreased to low 70s.  Reapplied 4 lts for recovery back to 96%.  Pt remained confused.  Alertness did not increase with activity.  Eyes shut most of session.  Pt mostly grimacing with pain.  Pt assisted back to bed same tech Bera Hug and + 2 Total Assist to scoot to back of bed and using bed pad pivot to supine with full support to upper and lower body.  Assisted with rolling to her right, and positioned with multiple pillows in RIGHT fetal position.  Take a break from supine. Returned to 3 lts nasal and applied purewick.    Ambulation/Gait                   Stairs             Wheelchair Mobility     Tilt Bed    Modified Rankin (Stroke Patients Only)       Balance                                            Communication    Cognition Arousal: Alert, Lethargic Behavior During Therapy: Anxious, Flat affect   PT - Cognitive impairments: History of cognitive impairments                       PT - Cognition Comments: sleepy/groggy responds to name and pain.  Plus resistant/fearful/confused.  Granddaughters present during session attempting to offer comfort. Following commands: Impaired      Cueing Cueing Techniques: Verbal cues, Gestural cues  Exercises      General Comments        Pertinent Vitals/Pain Pain Assessment Pain Assessment: Faces Faces Pain Scale: Hurts whole lot Pain Location: LLE with movement Pain Descriptors / Indicators: Grimacing,  Guarding, Moaning Pain Intervention(s): Monitored during session, Premedicated before session, Repositioned    Home Living                          Prior Function            PT Goals (current goals can now be found in the care plan section) Progress towards PT goals: Progressing toward goals    Frequency    Min 5X/week      PT Plan      Co-evaluation  AM-PAC PT 6 Clicks Mobility   Outcome Measure  Help needed turning from your back to your side while in a flat bed without using bedrails?: Total Help needed moving from lying on your back to sitting on the side of a flat bed without using bedrails?: Total Help needed moving to and from a bed to a chair (including a wheelchair)?: Total Help needed standing up from a chair using your arms (e.g., wheelchair or bedside chair)?: Total Help needed to walk in hospital room?: Total Help needed climbing 3-5 steps with a railing? : Total 6 Click Score: 6    End of Session Equipment Utilized During Treatment: Gait belt Activity Tolerance: Patient limited by fatigue;Patient limited by lethargy;Patient limited by pain;Other (comment) (cognition) Patient left: in bed;with nursing/sitter in room;with family/visitor present Nurse Communication: Mobility status PT Visit Diagnosis: Difficulty in walking, not elsewhere classified (R26.2);Muscle weakness (generalized) (M62.81);History of falling (Z91.81);Pain Pain - Right/Left: Left Pain - part of body: Hip;Leg     Time: 8461-8397 PT Time Calculation (min) (ACUTE ONLY): 24 min  Charges:    $Therapeutic Activity: 23-37 mins PT General Charges $$ ACUTE PT VISIT: 1 Visit                     Katheryn Leap  PTA Acute  Rehabilitation Services Office M-F          [phone]

## 2024-09-10 NOTE — Progress Notes (Signed)
 Orthopaedic Progress Note  S: Patient is resting comfortably in bed.  No acute distress  O:  Vitals:   09/09/24 2315 09/10/24 0611  BP: 118/64 (!) 138/47  Pulse: 70 89  Resp: 16 16  Temp: (!) 97.5 F (36.4 C) 98 F (36.7 C)  SpO2: 100% 96%    Clean dressing on her left hip.  Grossly neurologically intact    Labs:  Results for orders placed or performed during the hospital encounter of 09/07/24 (from the past 24 hours)  Glucose, capillary     Status: Abnormal   Collection Time: 09/09/24  7:58 AM  Result Value Ref Range   Glucose-Capillary 191 (H) 70 - 99 mg/dL  Basic metabolic panel with GFR     Status: Abnormal   Collection Time: 09/09/24  9:36 AM  Result Value Ref Range   Sodium 139 135 - 145 mmol/L   Potassium 4.9 3.5 - 5.1 mmol/L   Chloride 101 98 - 111 mmol/L   CO2 29 22 - 32 mmol/L   Glucose, Bld 191 (H) 70 - 99 mg/dL   BUN 25 (H) 8 - 23 mg/dL   Creatinine, Ser 9.29 0.44 - 1.00 mg/dL   Calcium  9.0 8.9 - 10.3 mg/dL   GFR, Estimated >39 >39 mL/min   Anion gap 9 5 - 15  CBC     Status: Abnormal   Collection Time: 09/09/24  9:36 AM  Result Value Ref Range   WBC 11.7 (H) 4.0 - 10.5 K/uL   RBC 3.13 (L) 3.87 - 5.11 MIL/uL   Hemoglobin 9.5 (L) 12.0 - 15.0 g/dL   HCT 68.9 (L) 63.9 - 53.9 %   MCV 99.0 80.0 - 100.0 fL   MCH 30.4 26.0 - 34.0 pg   MCHC 30.6 30.0 - 36.0 g/dL   RDW 86.7 88.4 - 84.4 %   Platelets 175 150 - 400 K/uL   nRBC 0.0 0.0 - 0.2 %  Glucose, capillary     Status: Abnormal   Collection Time: 09/09/24 11:53 AM  Result Value Ref Range   Glucose-Capillary 290 (H) 70 - 99 mg/dL  Glucose, capillary     Status: Abnormal   Collection Time: 09/09/24  4:47 PM  Result Value Ref Range   Glucose-Capillary 272 (H) 70 - 99 mg/dL  Glucose, capillary     Status: Abnormal   Collection Time: 09/09/24  8:36 PM  Result Value Ref Range   Glucose-Capillary 290 (H) 70 - 99 mg/dL  Glucose, capillary     Status: Abnormal   Collection Time: 09/10/24 12:02 AM  Result  Value Ref Range   Glucose-Capillary 191 (H) 70 - 99 mg/dL  CBC     Status: Abnormal   Collection Time: 09/10/24  3:26 AM  Result Value Ref Range   WBC 12.3 (H) 4.0 - 10.5 K/uL   RBC 2.92 (L) 3.87 - 5.11 MIL/uL   Hemoglobin 8.9 (L) 12.0 - 15.0 g/dL   HCT 71.4 (L) 63.9 - 53.9 %   MCV 97.6 80.0 - 100.0 fL   MCH 30.5 26.0 - 34.0 pg   MCHC 31.2 30.0 - 36.0 g/dL   RDW 86.8 88.4 - 84.4 %   Platelets 175 150 - 400 K/uL   nRBC 0.0 0.0 - 0.2 %  Glucose, capillary     Status: Abnormal   Collection Time: 09/10/24  4:08 AM  Result Value Ref Range   Glucose-Capillary 188 (H) 70 - 99 mg/dL    Assessment: Postop day 2 status post  IM rod left hip  Patient is doing well.  Her labs are not yet back from today but the H&H was stable yesterday.  She continue to work with physical therapy.  She may weightbearing as tolerated.  Continue Lovenox for DVT prophylaxis.  She be discharged to rehab when appropriate with medicine  Injuries: Left intertrochanteric hip fracture  Weightbearing: Weightbearing as tolerated  Insicional and dressing care: Dressing changes as needed   Pain management: Continue current pain regimen  VTE prophylaxis: Lovenox    Dispo: To rehab when okay with medicine  Follow - up plan: 2 weeks   Cordella Rhein, MD, MS [facility] [phone]

## 2024-09-10 NOTE — TOC Initial Note (Signed)
 Transition of Care [facility]) - Initial/Assessment Note    Patient Details  Name: Doris Wyatt MRN: [id number] Date of Birth: [birth year]  Transition of [facility]) CM/SW Contact:    Sheri ONEIDA Sharps, LCSW Phone Number: 09/10/2024, 11:59 AM  Clinical Narrative:                 Pt from home w/ children. Pt recommended for SNF. CSW attempted to contact pt family to discuss recommendation; no answer CSW left vm. PASRR pending, need to upload docs to NCMust once PASRR note is signed by MD. Pt continues medical workup.  Expected Discharge Plan: Skilled Nursing Facility Barriers to Discharge: Continued Medical Work up   Patient Goals and CMS Choice Patient states their goals for this hospitalization and ongoing recovery are:: return home   Choice offered to / list presented to : Adult Children  ownership interest in [facility].provided to:: Adult Children    Expected Discharge Plan and Services In-house Referral: NA Discharge Planning Services: NA Post Acute Care Choice: Skilled Nursing Facility Living arrangements for the past 2 months: Single Family Home                 DME Arranged: N/A DME Agency: NA       HH Arranged: NA HH Agency: NA        Prior Living Arrangements/Services Living arrangements for the past 2 months: Single Family Home Lives with:: Adult Children Patient language and need for interpreter reviewed:: Yes Do you feel safe going back to the place where you live?: Yes      Need for Family Participation in Patient Care: Yes (Comment) Care giver support system in place?: Yes (comment)   Criminal Activity/Legal Involvement Pertinent to Current Situation/Hospitalization: No - Comment as needed  Activities of Daily Living   ADL Screening (condition at time of admission) Independently performs ADLs?: No Does the patient have a NEW difficulty with bathing/dressing/toileting/self-feeding that is expected to last >3 days?: Yes (Initiates  electronic notice to provider for possible OT consult) Does the patient have a NEW difficulty with getting in/out of bed, walking, or climbing stairs that is expected to last >3 days?: Yes (Initiates electronic notice to provider for possible PT consult) Does the patient have a NEW difficulty with communication that is expected to last >3 days?: Yes (Initiates electronic notice to provider for possible SLP consult) Is the patient deaf or have difficulty hearing?: No Does the patient have difficulty seeing, even when wearing glasses/contacts?: Yes Does the patient have difficulty concentrating, remembering, or making decisions?: Yes  Permission Sought/Granted                  Emotional Assessment Appearance:: Appears stated age     Orientation: : Oriented to Self Alcohol / Substance Use: Not Applicable Psych Involvement: No (comment)  Admission diagnosis:  Fall in home, initial encounter [W19.XXXA, Y92.009] Closed comminuted intertrochanteric fracture of proximal femur, left, initial encounter (HCC) [S72.142A] Displaced intertrochanteric fracture of left femur, initial encounter for closed fracture [facility]) [S72.142A] Patient Active Problem List   Diagnosis Date Noted   Protein-calorie malnutrition, severe 09/08/2024   Closed comminuted intertrochanteric fracture of proximal femur, left, initial encounter (HCC) 09/07/2024   Generalized anxiety disorder 07/01/2021   Osteoporosis 07/01/2021   Major neurocognitive disorder due to Alzheimer's disease 07/01/2021   Diverticulitis of large intestine with perforation without abscess or bleeding    Ureteral stone with hydronephrosis 10/31/2017   Hypertension associated with diabetes (HCC)  COPD (chronic obstructive pulmonary disease)    Hyperlipidemia associated with type 2 diabetes mellitus (HCC)    Type 2 diabetes mellitus (HCC)    PCP:  [name], MD Pharmacy:   [id number] [address], [place] - [address] [place] [address] Phone: [phone] Fax: [phone]  [facility] [address] [place], [place] - [address] [place] [address] Phone: [phone] Fax: [phone]  [id number] - [place], [place] - [address] [place] [address] Phone: [phone] Fax: [phone]     Social Drivers of Health (SDOH) Social History: SDOH Screenings   Food Insecurity: No Food Insecurity (09/08/2024)  Housing: Low Risk  (09/08/2024)  Transportation Needs: No Transportation Needs (09/08/2024)  Utilities: Not At Risk (09/08/2024)  Depression (PHQ2-9): Low Risk  (11/16/2019)  Social Connections: Unknown (09/08/2024)  Tobacco Use: Medium Risk (09/08/2024)   SDOH Interventions:     Readmission Risk Interventions    09/09/2024   12:13 PM  Readmission Risk Prevention Plan  Post Dischage Appt Complete  Medication Screening Complete  Transportation Screening Complete

## 2024-09-10 NOTE — Plan of Care (Signed)

## 2024-09-10 NOTE — Plan of Care (Signed)
 Problem: Education: Goal: Ability to describe self-care measures that may prevent or decrease complications (Diabetes Survival Skills Education) will improve 09/10/2024 1904 by Teresa Jon SAILOR, LPN Outcome: Progressing 09/10/2024 1904 by Teresa Jon SAILOR, LPN Outcome: Progressing Goal: Individualized Educational Video(s) 09/10/2024 1904 by Teresa Jon SAILOR, LPN Outcome: Progressing 09/10/2024 1904 by Teresa Jon SAILOR, LPN Outcome: Progressing   Problem: Coping: Goal: Ability to adjust to condition or change in health will improve 09/10/2024 1904 by Teresa Jon SAILOR, LPN Outcome: Progressing 09/10/2024 1904 by Teresa Jon SAILOR, LPN Outcome: Progressing   Problem: Fluid Volume: Goal: Ability to maintain a balanced intake and output will improve 09/10/2024 1904 by Teresa Jon SAILOR, LPN Outcome: Progressing 09/10/2024 1904 by Teresa Jon SAILOR, LPN Outcome: Progressing   Problem: Health Behavior/Discharge Planning: Goal: Ability to identify and utilize available resources and services will improve 09/10/2024 1904 by Teresa Jon SAILOR, LPN Outcome: Progressing 09/10/2024 1904 by Teresa Jon SAILOR, LPN Outcome: Progressing Goal: Ability to manage health-related needs will improve 09/10/2024 1904 by Teresa Jon SAILOR, LPN Outcome: Progressing 09/10/2024 1904 by Teresa Jon SAILOR, LPN Outcome: Progressing   Problem: Metabolic: Goal: Ability to maintain appropriate glucose levels will improve 09/10/2024 1904 by Teresa Jon SAILOR, LPN Outcome: Progressing 09/10/2024 1904 by Teresa Jon SAILOR, LPN Outcome: Progressing   Problem: Nutritional: Goal: Maintenance of adequate nutrition will improve 09/10/2024 1904 by Teresa Jon SAILOR, LPN Outcome: Progressing 09/10/2024 1904 by Teresa Jon SAILOR, LPN Outcome: Progressing Goal: Progress toward achieving an optimal weight will improve 09/10/2024 1904 by Teresa Jon SAILOR, LPN Outcome: Progressing 09/10/2024 1904 by Teresa Jon SAILOR, LPN Outcome:  Progressing   Problem: Skin Integrity: Goal: Risk for impaired skin integrity will decrease 09/10/2024 1904 by Teresa Jon SAILOR, LPN Outcome: Progressing 09/10/2024 1904 by Teresa Jon SAILOR, LPN Outcome: Progressing   Problem: Tissue Perfusion: Goal: Adequacy of tissue perfusion will improve 09/10/2024 1904 by Teresa Jon SAILOR, LPN Outcome: Progressing 09/10/2024 1904 by Teresa Jon SAILOR, LPN Outcome: Progressing   Problem: Education: Goal: Knowledge of General Education information will improve Description: Including pain rating scale, medication(s)/side effects and non-pharmacologic comfort measures 09/10/2024 1904 by Teresa Jon SAILOR, LPN Outcome: Progressing 09/10/2024 1904 by Teresa Jon SAILOR, LPN Outcome: Progressing   Problem: Health Behavior/Discharge Planning: Goal: Ability to manage health-related needs will improve 09/10/2024 1904 by Teresa Jon SAILOR, LPN Outcome: Progressing 09/10/2024 1904 by Teresa Jon SAILOR, LPN Outcome: Progressing   Problem: Clinical Measurements: Goal: Ability to maintain clinical measurements within normal limits will improve 09/10/2024 1904 by Teresa Jon SAILOR, LPN Outcome: Progressing 09/10/2024 1904 by Teresa Jon SAILOR, LPN Outcome: Progressing Goal: Will remain free from infection 09/10/2024 1904 by Teresa Jon SAILOR, LPN Outcome: Progressing 09/10/2024 1904 by Teresa Jon SAILOR, LPN Outcome: Progressing Goal: Diagnostic test results will improve 09/10/2024 1904 by Teresa Jon SAILOR, LPN Outcome: Progressing 09/10/2024 1904 by Teresa Jon SAILOR, LPN Outcome: Progressing Goal: Respiratory complications will improve 09/10/2024 1904 by Teresa Jon SAILOR, LPN Outcome: Progressing 09/10/2024 1904 by Teresa Jon SAILOR, LPN Outcome: Progressing Goal: Cardiovascular complication will be avoided 09/10/2024 1904 by Teresa Jon SAILOR, LPN Outcome: Progressing 09/10/2024 1904 by Teresa Jon SAILOR, LPN Outcome: Progressing   Problem: Activity: Goal: Risk  for activity intolerance will decrease 09/10/2024 1904 by Teresa Jon SAILOR, LPN Outcome: Progressing 09/10/2024 1904 by Teresa Jon SAILOR, LPN Outcome: Progressing   Problem: Nutrition: Goal: Adequate nutrition will be maintained 09/10/2024 1904 by Teresa Jon SAILOR, LPN Outcome: Progressing 09/10/2024 1904 by Teresa Jon SAILOR, LPN Outcome: Progressing  Problem: Coping: Goal: Level of anxiety will decrease 09/10/2024 1904 by Teresa Jon SAILOR, LPN Outcome: Progressing 09/10/2024 1904 by Teresa Jon SAILOR, LPN Outcome: Progressing   Problem: Elimination: Goal: Will not experience complications related to bowel motility 09/10/2024 1904 by Teresa Jon SAILOR, LPN Outcome: Progressing 09/10/2024 1904 by Teresa Jon SAILOR, LPN Outcome: Progressing Goal: Will not experience complications related to urinary retention 09/10/2024 1904 by Teresa Jon SAILOR, LPN Outcome: Progressing 09/10/2024 1904 by Teresa Jon SAILOR, LPN Outcome: Progressing   Problem: Pain Managment: Goal: General experience of comfort will improve and/or be controlled 09/10/2024 1904 by Teresa Jon SAILOR, LPN Outcome: Progressing 09/10/2024 1904 by Teresa Jon SAILOR, LPN Outcome: Progressing   Problem: Safety: Goal: Ability to remain free from injury will improve 09/10/2024 1904 by Teresa Jon SAILOR, LPN Outcome: Progressing 09/10/2024 1904 by Teresa Jon SAILOR, LPN Outcome: Progressing   Problem: Skin Integrity: Goal: Risk for impaired skin integrity will decrease 09/10/2024 1904 by Teresa Jon SAILOR, LPN Outcome: Progressing 09/10/2024 1904 by Teresa Jon SAILOR, LPN Outcome: Progressing

## 2024-09-11 ENCOUNTER — Encounter (HOSPITAL_COMMUNITY): Payer: Self-pay

## 2024-09-11 DIAGNOSIS — S72142A Displaced intertrochanteric fracture of left femur, initial encounter for closed fracture: Secondary | ICD-10-CM | POA: Diagnosis not present

## 2024-09-11 LAB — GLUCOSE, CAPILLARY
Glucose-Capillary: 138 mg/dL — ABNORMAL HIGH (ref 70–99)
Glucose-Capillary: 225 mg/dL — ABNORMAL HIGH (ref 70–99)
Glucose-Capillary: 235 mg/dL — ABNORMAL HIGH (ref 70–99)
Glucose-Capillary: 240 mg/dL — ABNORMAL HIGH (ref 70–99)
Glucose-Capillary: 250 mg/dL — ABNORMAL HIGH (ref 70–99)
Glucose-Capillary: 250 mg/dL — ABNORMAL HIGH (ref 70–99)
Glucose-Capillary: 252 mg/dL — ABNORMAL HIGH (ref 70–99)
Glucose-Capillary: 254 mg/dL — ABNORMAL HIGH (ref 70–99)

## 2024-09-11 MED ORDER — IRBESARTAN 150 MG PO TABS
150.0000 mg | ORAL_TABLET | Freq: Every day | ORAL | Status: DC
  Administered 2024-09-11 – 2024-09-12 (×2): 150 mg via ORAL
  Filled 2024-09-11 (×2): qty 1

## 2024-09-11 NOTE — TOC Progression Note (Signed)
 Transition of Care [facility]) - Progression Note    Patient Details  Name: Doris Wyatt MRN: [id number] Date of Birth: Mar 05, 1946  Transition of Care [facility]) CM/SW Contact  NORMAN ASPEN, LCSW Phone Number: 09/11/2024, 3:17 PM  Clinical Narrative:     Met with pt and son, Doris Wyatt, today to confirm plan for SNF rehab.  Pt not oriented and lying in bed with mittens.  Son states family in agreement with plan and no facility preferences.  Bed search begun.  Expected Discharge Plan: Skilled Nursing Facility Barriers to Discharge: Continued Medical Work up               Expected Discharge Plan and Services In-house Referral: NA Discharge Planning Services: NA Post Acute Care Choice: Skilled Nursing Facility Living arrangements for the past 2 months: Single Family Home                 DME Arranged: N/A DME Agency: NA       HH Arranged: NA HH Agency: NA         Social Drivers of Health (SDOH) Interventions SDOH Screenings   Food Insecurity: No Food Insecurity (09/08/2024)  Housing: Low Risk  (09/08/2024)  Transportation Needs: No Transportation Needs (09/08/2024)  Utilities: Not At Risk (09/08/2024)  Depression (PHQ2-9): Low Risk  (11/16/2019)  Social Connections: Unknown (09/08/2024)  Tobacco Use: Medium Risk (09/08/2024)    Readmission Risk Interventions    09/09/2024   12:13 PM  Readmission Risk Prevention Plan  Post Dischage Appt Complete  Medication Screening Complete  Transportation Screening Complete

## 2024-09-11 NOTE — NC FL2 (Cosign Needed Addendum)
 [place]  MEDICAID FL2 LEVEL OF CARE FORM     IDENTIFICATION  Patient Name: Doris Wyatt Birthdate: 1946-02-04 Sex: female Admission Date (Current Location): 09/07/2024  [facility] and [place] Number:  Producer, television/film/video and Address:  [facility],  [address] [place], [place] [address]      Provider Number: [id number]  Attending Physician Name and Address:  Odell Castor, [place], MD  Relative Name and Phone Number:  son, Senita Corredor @ [phone]    Current Level of Care: Hospital Recommended Level of Care: Skilled Nursing Facility Prior Approval Number:    Date Approved/Denied:   PASRR Number: [id number]  Discharge Plan: SNF    Current Diagnoses: Patient Active Problem List   Diagnosis Date Noted   Protein-calorie malnutrition, severe 09/08/2024   Closed comminuted intertrochanteric fracture of proximal femur, left, initial encounter (HCC) 09/07/2024   Generalized anxiety disorder 07/01/2021   Osteoporosis 07/01/2021   Major neurocognitive disorder due to Alzheimer's disease 07/01/2021   Diverticulitis of large intestine with perforation without abscess or bleeding    Ureteral stone with hydronephrosis 10/31/2017   Hypertension associated with diabetes (HCC)    COPD (chronic obstructive pulmonary disease)    Hyperlipidemia associated with type 2 diabetes mellitus (HCC)    Type 2 diabetes mellitus (HCC)     Orientation RESPIRATION BLADDER Height & Weight     Self  O2 Continent, External catheter (currently with purewick) Weight: 97 lb 10.6 oz (44.3 kg) Height:  5' 5 (165.1 cm) (per granddaughter)  BEHAVIORAL SYMPTOMS/MOOD NEUROLOGICAL BOWEL NUTRITION STATUS      Continent Diet (regular)  AMBULATORY STATUS COMMUNICATION OF NEEDS Skin   Extensive Assist Verbally Other (Comment) (surgical incision only)                       Personal Care Assistance Level of Assistance  Bathing, Feeding, Dressing Bathing Assistance: Limited  assistance Feeding assistance: Limited assistance Dressing Assistance: Limited assistance     Functional Limitations Info  Sight, Hearing, Speech Sight Info: Adequate Hearing Info: Adequate Speech Info: Adequate    SPECIAL CARE FACTORS FREQUENCY  PT (By licensed PT), OT (By licensed OT)     PT Frequency: 5x/wk OT Frequency: 5x/wk            Contractures Contractures Info: Not present    Additional Factors Info  Code Status, Allergies, Psychotropic Code Status Info: Full Allergies Info: Morphine  Sulfate, Oxycodone -acetaminophen , Sertraline Hcl Psychotropic Info: see MAR         Current Medications (09/11/2024):  This is the current hospital active medication list Current Facility-Administered Medications  Medication Dose Route Frequency Provider Last Rate Last Admin   acetaminophen  (TYLENOL ) tablet 325-650 mg  325-650 mg Oral Q6H PRN Reyne Cordella SQUIBB, MD       albuterol  (PROVENTIL ) (2.5 MG/3ML) 0.083% nebulizer solution 2.5 mg  2.5 mg Nebulization Q6H PRN Patel, Vishal R, MD       atorvastatin  (LIPITOR) tablet 40 mg  40 mg Oral Daily Patel, Vishal R, MD   40 mg at 09/11/24 1021   bisacodyl (DULCOLAX) suppository 10 mg  10 mg Rectal Daily PRN Gebauer, Gregory P, MD       divalproex  (DEPAKOTE ) DR tablet 125 mg  125 mg Oral BID Patel, Vishal R, MD   125 mg at 09/11/24 1100   docusate sodium  (COLACE) capsule 100 mg  100 mg Oral BID Reyne Cordella SQUIBB, MD   100 mg at 09/11/24 1022   donepezil  (ARICEPT )  tablet 23 mg  23 mg Oral QHS Patel, Vishal R, MD   23 mg at 09/10/24 2217   enoxaparin (LOVENOX) injection 30 mg  30 mg Subcutaneous Q24H Reyne Cordella SQUIBB, MD   30 mg at 09/11/24 0846   feeding supplement (GLUCERNA SHAKE) (GLUCERNA SHAKE) liquid 237 mL  237 mL Oral BID BM Odell Celinda Balo, MD   237 mL at 09/11/24 1413   HYDROcodone -acetaminophen  (NORCO/VICODIN) 5-325 MG per tablet 1 tablet  1 tablet Oral Q6H PRN Reyne Cordella SQUIBB, MD   1 tablet at 09/11/24 0949    HYDROmorphone (DILAUDID) injection 0.5-1 mg  0.5-1 mg Intravenous Q4H PRN Reyne Cordella SQUIBB, MD   0.5 mg at 09/11/24 1423   insulin  aspart (novoLOG ) injection 0-15 Units  0-15 Units Subcutaneous TID WC Odell Celinda Balo, MD   5 Units at 09/11/24 1308   insulin  aspart (novoLOG ) injection 0-5 Units  0-5 Units Subcutaneous QHS Odell Celinda Balo, MD   2 Units at 09/10/24 2300   insulin  aspart (novoLOG ) injection 3 Units  3 Units Subcutaneous TID WC Odell Celinda Balo, MD   3 Units at 09/11/24 1307   irbesartan  (AVAPRO ) tablet 150 mg  150 mg Oral Daily Odell Celinda Balo, MD   150 mg at 09/11/24 1021   melatonin tablet 6 mg  6 mg Oral QHS Odell Celinda Balo, MD   6 mg at 09/10/24 2218   memantine  (NAMENDA ) tablet 10 mg  10 mg Oral BID Patel, Vishal R, MD   10 mg at 09/11/24 1021   multivitamin with minerals tablet 1 tablet  1 tablet Oral Daily Odell Celinda Balo, MD   1 tablet at 09/11/24 1022   ondansetron  (ZOFRAN ) tablet 4 mg  4 mg Oral Q6H PRN Reyne Cordella SQUIBB, MD       Or   ondansetron  (ZOFRAN ) injection 4 mg  4 mg Intravenous Q6H PRN Reyne Cordella SQUIBB, MD   4 mg at 09/11/24 0331   prochlorperazine (COMPAZINE) injection 10 mg  10 mg Intravenous Q4H PRN Odell Celinda Balo, MD   10 mg at 09/10/24 1108   senna-docusate (Senokot-S) tablet 1 tablet  1 tablet Oral QHS PRN Patel, Vishal R, MD       sodium phosphate  (FLEET) enema 1 enema  1 enema Rectal Once PRN Reyne Cordella SQUIBB, MD         Discharge Medications: Please see discharge summary for a list of discharge medications.  Relevant Imaging Results:  Relevant Lab Results:   Additional Information SS# [id number]  NORMAN ASPEN, LCSW

## 2024-09-11 NOTE — Progress Notes (Signed)
 SLP Cancellation Note  Patient Details Name: MILANY GECK MRN: [id number] DOB: 12-07-1945   Cancelled treatment:       Reason Eval/Treat Not Completed: SLP screened, no needs identified, will sign off. SLP spoke with patient's son and daughter briefly in room. They both indicated that patient's cognition is currently impacted by sedating medications but they suspect she is at her baseline. CT head was negative for acute intracranial abnormality.  SLP to s/o at this time.   Norleen IVAR Blase, MA, CCC-SLP Speech Therapy

## 2024-09-11 NOTE — Progress Notes (Addendum)
 [facility] PROGRESS NOTE    Progress Note  Doris Wyatt  FMW:[id number] DOB: December 26, 1945 DOA: 09/07/2024 PCP: [name], MD     Brief Narrative:   Doris Wyatt is an 77 y.o. female past medical history significant for dementia, COPD diabetes mellitus type 2 depression anxiety comes into the ED for left hip pain after a unwitnessed fall, did not hit her head.  Imaging showed an acute left intertrochanteric proximal fracture. Assessment/Plan:   Closed comminuted intertrochanteric fracture of proximal femur, left, initial encounter [facility]) Orthopedic surgery was consulted, status post intramedullary nailing of the femur. PT OT eval, will need skilled nursing facility, we are currently awaiting [facility]. There is been a drop in hemoglobin from 12->9, likely postop. Anticoagulation and narcotics per orthopedic surgery.   Hypertension associated with diabetes (HCC) Blood pressure well-controlled current regimen.  COPD (chronic obstructive pulmonary disease) Continue inhalers.  Hyperlipidemia associated with type 2 diabetes mellitus (HCC) Continue statins.  Type 2 diabetes mellitus (HCC) Continue sliding scale insulin .  Major neurocognitive disorder due to Alzheimer's disease Continue current medications Aricept  Namenda  Depakote . High risk of delirium and aspiration.  Blood loss anemia postop: Likely due to postop asymptomatic currently 9.  Severe Malnutrition related to chronic illness : Proabaly secondarly to COPD; dementia.    DVT prophylaxis: lovenox Family Communication: Daughter Status is: Inpatient Remains inpatient appropriate because: Acute left hip fracture    Code Status:     Code Status Orders  (From admission, onward)           Start     Ordered   09/07/24 2344  Full code  Continuous       Question:  By:  Answer:  Consent: discussion documented in EHR   09/07/24 2344           Code Status History     Date Active Date  Inactive Code Status Order ID Comments User Context   10/31/2017 1517 11/04/2017 1557 Full Code [id number]  Alm Maxwell LABOR, MD Inpatient         IV Access:   Peripheral IV   Procedures and diagnostic studies:   No results found.    Medical Consultants:   None.   Subjective:    Doris Wyatt pain is controlled.  Objective:    Vitals:   09/10/24 0611 09/10/24 1655 09/10/24 2159 09/11/24 0515  BP: (!) 138/47 (!) 145/54 (!) 152/62 (!) 171/57  Pulse: 89 92 88 79  Resp: 16 16 18 18   Temp: 98 F (36.7 C) 98.2 F (36.8 C) 98.4 F (36.9 C) 97.8 F (36.6 C)  TempSrc:   Oral Oral  SpO2: 96% 100% 100% 99%  Weight:      Height:       SpO2: 99 % O2 Flow Rate (L/min): 3 L/min   Intake/Output Summary (Last 24 hours) at 09/11/2024 9187 Last data filed at 09/11/2024 0700 Gross per 24 hour  Intake 380 ml  Output 0 ml  Net 380 ml   Filed Weights   09/08/24 9367  Weight: 44.3 kg    Exam: General exam: In no acute distress. Respiratory system: Good air movement and clear to auscultation. Cardiovascular system: S1 & S2 heard, RRR. No JVD. Gastrointestinal system: Abdomen is nondistended, soft and nontender.  Extremities: No pedal edema. Skin: No rashes, lesions or ulcers Psychiatry: No judgment or insight of medical condition. Data Reviewed:    Labs: Basic Metabolic Panel: Recent Labs  Lab 09/07/24 1937 09/08/24 0043 09/08/24 1709  09/09/24 0936  NA 141 140  --  139  K 3.8 4.5  --  4.9  CL 100 101  --  101  CO2 28 29  --  29  GLUCOSE 271* 379*  --  191*  BUN 18 19  --  25*  CREATININE 0.54 0.57 0.60 0.70  CALCIUM  9.4 8.9  --  9.0   GFR Estimated Creatinine Clearance: 39.9 mL/min (by C-G formula based on SCr of 0.7 mg/dL). Liver Function Tests: No results for input(s): AST, ALT, ALKPHOS, BILITOT, PROT, ALBUMIN in the last 168 hours. No results for input(s): LIPASE, AMYLASE in the last 168 hours. No results for input(s):  AMMONIA in the last 168 hours. Coagulation profile Recent Labs  Lab 09/08/24 0837  INR 1.1   COVID-19 Labs  No results for input(s): DDIMER, FERRITIN, LDH, CRP in the last 72 hours.  No results found for: SARSCOV2NAA  CBC: Recent Labs  Lab 09/07/24 1937 09/08/24 0043 09/08/24 1709 09/09/24 0936 09/10/24 0326  WBC 9.4 16.5* 16.4* 11.7* 12.3*  NEUTROABS 6.4  --   --   --   --   HGB 13.4 12.5 11.5* 9.5* 8.9*  HCT 42.3 38.5 36.4 31.0* 28.5*  MCV 95.9 94.4 96.8 99.0 97.6  PLT 228 214 231 175 175   Cardiac Enzymes: No results for input(s): CKTOTAL, CKMB, CKMBINDEX, TROPONINI in the last 168 hours. BNP (last 3 results) No results for input(s): PROBNP in the last 8760 hours. CBG: Recent Labs  Lab 09/10/24 1654 09/10/24 2031 09/11/24 0002 09/11/24 0413 09/11/24 0714  GLUCAP 206* 225* 225* 235* 254*   D-Dimer: No results for input(s): DDIMER in the last 72 hours. Hgb A1c: Recent Labs    09/10/24 0858  HGBA1C 9.0*   Lipid Profile: No results for input(s): CHOL, HDL, LDLCALC, TRIG, CHOLHDL, LDLDIRECT in the last 72 hours. Thyroid  function studies: No results for input(s): TSH, T4TOTAL, T3FREE, THYROIDAB in the last 72 hours.  Invalid input(s): FREET3 Anemia work up: No results for input(s): VITAMINB12, FOLATE, FERRITIN, TIBC, IRON, RETICCTPCT in the last 72 hours. Sepsis Labs: Recent Labs  Lab 09/08/24 0043 09/08/24 1709 09/09/24 0936 09/10/24 0326  WBC 16.5* 16.4* 11.7* 12.3*   Microbiology Recent Results (from the past 240 hours)  Surgical pcr screen     Status: None   Collection Time: 09/08/24  3:29 AM   Specimen: Nasal Mucosa; Nasal Swab  Result Value Ref Range Status   MRSA, PCR NEGATIVE NEGATIVE Final   Staphylococcus aureus NEGATIVE NEGATIVE Final    Comment: (NOTE) The Xpert SA Assay (FDA approved for NASAL specimens in patients 44 years of age and older), is one component of a  comprehensive surveillance program. It is not intended to diagnose infection nor to guide or monitor treatment. Performed at [facility], [address] [place], [place] [address]      Medications:    atorvastatin   40 mg Oral Daily   divalproex   125 mg Oral BID   docusate sodium   100 mg Oral BID   donepezil   23 mg Oral QHS   enoxaparin (LOVENOX) injection  30 mg Subcutaneous Q24H   feeding supplement (GLUCERNA SHAKE)  237 mL Oral BID BM   insulin  aspart  0-15 Units Subcutaneous TID WC   insulin  aspart  0-5 Units Subcutaneous QHS   insulin  aspart  3 Units Subcutaneous TID WC   irbesartan   75 mg Oral Daily   melatonin  6 mg Oral QHS   memantine   10 mg Oral BID   multivitamin with minerals  1 tablet Oral Daily   Continuous Infusions:      LOS: 4 days   Doris Wyatt  [facility]  09/11/2024, 8:12 AM

## 2024-09-12 ENCOUNTER — Inpatient Hospital Stay (HOSPITAL_COMMUNITY)

## 2024-09-12 DIAGNOSIS — M25572 Pain in left ankle and joints of left foot: Secondary | ICD-10-CM | POA: Diagnosis not present

## 2024-09-12 DIAGNOSIS — S72142A Displaced intertrochanteric fracture of left femur, initial encounter for closed fracture: Secondary | ICD-10-CM | POA: Diagnosis not present

## 2024-09-12 DIAGNOSIS — M79672 Pain in left foot: Secondary | ICD-10-CM | POA: Diagnosis not present

## 2024-09-12 DIAGNOSIS — S9002XA Contusion of left ankle, initial encounter: Secondary | ICD-10-CM | POA: Diagnosis not present

## 2024-09-12 LAB — TYPE AND SCREEN
ABO/RH(D): A NEG
Antibody Screen: NEGATIVE
Unit division: 0
Unit division: 0

## 2024-09-12 LAB — BPAM RBC
Blood Product Expiration Date: 202511012359
Blood Product Expiration Date: 202511022359
Unit Type and Rh: 600
Unit Type and Rh: 600

## 2024-09-12 LAB — GLUCOSE, CAPILLARY
Glucose-Capillary: 262 mg/dL — ABNORMAL HIGH (ref 70–99)
Glucose-Capillary: 244 mg/dL — ABNORMAL HIGH (ref 70–99)
Glucose-Capillary: 230 mg/dL — ABNORMAL HIGH (ref 70–99)
Glucose-Capillary: 265 mg/dL — ABNORMAL HIGH (ref 70–99)
Glucose-Capillary: 221 mg/dL — ABNORMAL HIGH (ref 70–99)

## 2024-09-12 MED ORDER — IRBESARTAN 150 MG PO TABS
300.0000 mg | ORAL_TABLET | Freq: Every day | ORAL | Status: DC
  Administered 2024-09-13 – 2024-09-14 (×2): 300 mg via ORAL
  Filled 2024-09-12 (×2): qty 2

## 2024-09-12 MED ORDER — ENOXAPARIN SODIUM 30 MG/0.3ML IJ SOSY: 30.0000 mg | PREFILLED_SYRINGE | INTRAMUSCULAR | 0 refills | Status: AC

## 2024-09-12 MED ORDER — FERROUS SULFATE 325 (65 FE) MG PO TBEC: 325.0000 mg | DELAYED_RELEASE_TABLET | Freq: Two times a day (BID) | ORAL | Status: DC

## 2024-09-12 MED ORDER — AMLODIPINE BESYLATE 5 MG PO TABS
5.0000 mg | ORAL_TABLET | Freq: Every day | ORAL | Status: DC
Start: 2024-09-12 — End: 2024-09-14
  Administered 2024-09-12 – 2024-09-14 (×3): 5 mg via ORAL
  Filled 2024-09-12 (×3): qty 1

## 2024-09-12 MED ORDER — ENOXAPARIN SODIUM 30 MG/0.3ML IJ SOSY: 30.0000 mg | PREFILLED_SYRINGE | INTRAMUSCULAR | Status: DC

## 2024-09-12 MED ORDER — HYDROCODONE-ACETAMINOPHEN 5-325 MG PO TABS: 1.0000 | ORAL_TABLET | Freq: Four times a day (QID) | ORAL | 0 refills | Status: DC | PRN

## 2024-09-12 NOTE — TOC Progression Note (Signed)
 Transition of Care [facility]) - Progression Note    Patient Details  Name: Doris Wyatt MRN: [id number] Date of Birth: [birth year]  [facility]) CM/SW Contact  NORMAN ASPEN, LCSW Phone Number: 09/12/2024, 3:31 PM  Clinical Narrative:     Have provided pt/ family with SNF bed offers and awaiting choice/ then will secure insurance auth.  Expected Discharge Plan: Skilled Nursing Facility Barriers to Discharge: Continued Medical Work up               Expected Discharge Plan and Services In-house Referral: NA Discharge Planning Services: NA Post Acute Care Choice: Skilled Nursing Facility Living arrangements for the past 2 months: Single Family Home Expected Discharge Date: 09/12/24               DME Arranged: N/A DME Agency: NA       HH Arranged: NA HH Agency: NA         Social Drivers of Health (SDOH) Interventions SDOH Screenings   Food Insecurity: No Food Insecurity (09/08/2024)  Housing: Low Risk  (09/08/2024)  Transportation Needs: No Transportation Needs (09/08/2024)  Utilities: Not At Risk (09/08/2024)  Depression (PHQ2-9): Low Risk  (11/16/2019)  Social Connections: Unknown (09/08/2024)  Tobacco Use: Medium Risk (09/08/2024)    Readmission Risk Interventions    09/09/2024   12:13 PM  Readmission Risk Prevention Plan  Post Dischage Appt Complete  Medication Screening Complete  Transportation Screening Complete

## 2024-09-12 NOTE — Discharge Summary (Addendum)
 Physician Discharge Summary  Doris Wyatt FMW:[id number] DOB: 1946-03-26 DOA: 09/07/2024  PCP: [name], MD  Admit date: 09/07/2024 Discharge date: 09/14/2024  Admitted From: Home Disposition:  SNF  Recommendations for Outpatient Follow-up:  Follow up with PCP in 1-2 weeks Please obtain BMP/CBC in one week   Home Health:No Equipment/Devices:None  Discharge Condition:Stable CODE STATUS:Full Diet recommendation: Heart Healthy  Brief/Interim Summary: 78 y.o. female past medical history significant for dementia, COPD diabetes mellitus type 2 depression anxiety comes into the ED for left hip pain after a unwitnessed fall, did not hit her head.  Imaging showed an acute left intertrochanteric proximal fracture.   Discharge Diagnoses:   Close comminuted intertrochanteric fracture of the proximal femur: Orthopedic surgery was consulted she status post ORIF on 09/11/2024. PT evaluated the patient recommended skilled nursing facility. Hemoglobin has remained relatively stable. Ortho recommended Lovenox for DVT prophylaxis for 30 days.  Essential hypertension: She was restarted on her home dose of ARB blood pressure slightly elevated due to pain.  COPD: Continue inhalers.  Hyperlipidemia with diabetes mellitus type 2: Continue statins.  Diabetes mellitus type 2: Continue metformin at home no changes made to her medic condition.  Major neurocognitive disorder due to Alzheimer's: No change made to her medication continue Aricept  Namenda  and Depakote .  Mild blood loss anemia postop: Her hemoglobin has remained stable at 9.  Postsurgical. On admission was 12 postsurgery remained stable like around 9.  Severe Malnutrition related to chronic illness (COPD; dementia)   Addendum on 09/14/2024 Patient was supposed to be discharged on 09/12/2024 to SNF but is currently waiting for SNF. She is currently medically stable for discharge. Patient seen and examined at bedside  again today.    Discharge Instructions  Discharge Instructions     Diet - low sodium heart healthy   Complete by: As directed    Increase activity slowly   Complete by: As directed    No wound care   Complete by: As directed       Allergies as of 09/12/2024       Reactions   Morphine  Sulfate Other (See Comments)   Oxycodone -acetaminophen  Other (See Comments)   Sertraline Hcl Other (See Comments)        Medication List     TAKE these medications    acetaminophen  325 MG tablet Commonly known as: TYLENOL  Take 2 tablets (650 mg total) by mouth every 6 (six) hours as needed for mild pain (or Fever >/= 101).   albuterol  108 (90 Base) MCG/ACT inhaler Commonly known as: VENTOLIN  HFA Inhale 2 puffs into the lungs every 6 (six) hours as needed for wheezing or shortness of breath.   aspirin EC 81 MG tablet Take 81 mg by mouth every evening. Swallow whole.   atorvastatin  40 MG tablet Commonly known as: LIPITOR Take 40 mg by mouth daily.   divalproex  125 MG DR tablet Commonly known as: DEPAKOTE  Take 1 tablet (125 mg total) by mouth 2 (two) times daily.   donepezil  23 MG Tabs tablet Commonly known as: ARICEPT  TAKE 1 TABLET BY MOUTH EVERY DAY AT NIGHT   glimepiride 4 MG tablet Commonly known as: AMARYL Take 4 mg by mouth daily with breakfast.   HYDROcodone -acetaminophen  5-325 MG tablet Commonly known as: NORCO/VICODIN Take 1 tablet by mouth every 6 (six) hours as needed for moderate pain (pain score 4-6) (moderate pain).   memantine  10 MG tablet Commonly known as: NAMENDA  TAKE 1 TABLET BY MOUTH TWICE A DAY   metFORMIN  500 MG 24 hr tablet Commonly known as: GLUCOPHAGE-XR Take 500 mg by mouth every evening.   valsartan  80 MG tablet Commonly known as: DIOVAN  Take 80 mg by mouth daily.        Allergies  Allergen Reactions   Morphine  Sulfate Other (See Comments)   Oxycodone -Acetaminophen  Other (See Comments)   Sertraline Hcl Other (See Comments)     Consultations: Orthopedic surgery   Procedures/Studies: ECHOCARDIOGRAM COMPLETE Result Date: 09/08/2024    ECHOCARDIOGRAM REPORT   Patient Name:   [name] Date of Exam: 09/08/2024 Medical Rec #:  [id number]           Height:       65.0 in Accession #:    [id number]          Weight:       97.7 lb Date of Birth:  Jan 24, 1946           BSA:          1.459 m Patient Age:    77 years            BP:           132/56 mmHg Patient Gender: F                   HR:           89 bpm. Exam Location:  Inpatient Procedure: 2D Echo, Cardiac Doppler and Color Doppler (Both Spectral and Color            Flow Doppler were utilized during procedure). Indications:    Dyspnea  History:        Patient has no prior history of Echocardiogram examinations.                 COPD; Risk Factors:Hypertension and Diabetes.  Sonographer:    Philomena Daring Referring Phys: [id number] Samya Siciliano FELIZ ORTIZ IMPRESSIONS  1. Left ventricular ejection fraction, by estimation, is 60 to 65%. The left ventricle has normal function. The left ventricle has no regional wall motion abnormalities. Left ventricular diastolic parameters were normal.  2. Right ventricular systolic function is normal. The right ventricular size is normal. There is normal pulmonary artery systolic pressure.  3. The mitral valve is grossly normal. Trivial mitral valve regurgitation. No evidence of mitral stenosis.  4. The aortic valve was not well visualized. There is mild calcification of the aortic valve. There is mild thickening of the aortic valve. Aortic valve regurgitation is not visualized. No aortic stenosis is present.  5. The inferior vena cava is normal in size with greater than 50% respiratory variability, suggesting right atrial pressure of 3 mmHg. Comparison(s): No prior Echocardiogram. Conclusion(s)/Recommendation(s): Normal biventricular function without evidence of hemodynamically significant valvular heart disease. FINDINGS  Left Ventricle: Left  ventricular ejection fraction, by estimation, is 60 to 65%. The left ventricle has normal function. The left ventricle has no regional wall motion abnormalities. The left ventricular internal cavity size was normal in size. There is  no left ventricular hypertrophy. Left ventricular diastolic parameters were normal. Right Ventricle: The right ventricular size is normal. No increase in right ventricular wall thickness. Right ventricular systolic function is normal. There is normal pulmonary artery systolic pressure. The tricuspid regurgitant velocity is 2.46 m/s, and  with an assumed right atrial pressure of 3 mmHg, the estimated right ventricular systolic pressure is 27.2 mmHg. Left Atrium: Left atrial size was normal in size. Right Atrium: Right atrial size was normal in size. Pericardium: There  is no evidence of pericardial effusion. Mitral Valve: The mitral valve is grossly normal. There is mild thickening of the mitral valve leaflet(s). Mild to moderate mitral annular calcification. Trivial mitral valve regurgitation. No evidence of mitral valve stenosis. Tricuspid Valve: The tricuspid valve is normal in structure. Tricuspid valve regurgitation is trivial. No evidence of tricuspid stenosis. Aortic Valve: The aortic valve was not well visualized. There is mild calcification of the aortic valve. There is mild thickening of the aortic valve. Aortic valve regurgitation is not visualized. No aortic stenosis is present. Pulmonic Valve: The pulmonic valve was not well visualized. Pulmonic valve regurgitation is not visualized. No evidence of pulmonic stenosis. Aorta: The aortic root is normal in size and structure. Venous: The inferior vena cava is normal in size with greater than 50% respiratory variability, suggesting right atrial pressure of 3 mmHg. IAS/Shunts: The atrial septum is grossly normal.  LEFT VENTRICLE PLAX 2D LVIDd:         3.80 cm   Diastology LVIDs:         2.60 cm   LV e' medial:    8.05 cm/s LV PW:          0.90 cm   LV E/e' medial:  10.4 LV IVS:        0.90 cm   LV e' lateral:   8.81 cm/s LVOT diam:     2.00 cm   LV E/e' lateral: 9.5 LV SV:         56 LV SV Index:   39 LVOT Area:     3.14 cm  RIGHT VENTRICLE             IVC RV S prime:     10.90 cm/s  IVC diam: 1.60 cm TAPSE (M-mode): 2.3 cm LEFT ATRIUM           Index        RIGHT ATRIUM           Index LA Vol (A2C): 32.1 ml 22.01 ml/m  RA Area:     10.60 cm LA Vol (A4C): 18.9 ml 12.96 ml/m  RA Volume:   23.10 ml  15.84 ml/m  AORTIC VALVE LVOT Vmax:   77.10 cm/s LVOT Vmean:  51.400 cm/s LVOT VTI:    0.179 m  AORTA Ao Root diam: 2.40 cm MITRAL VALVE               TRICUSPID VALVE MV Area (PHT): 3.38 cm    TR Peak grad:   24.2 mmHg MV E velocity: 84.00 cm/s  TR Vmax:        246.00 cm/s MV A velocity: 71.60 cm/s MV E/A ratio:  1.17        SHUNTS                            Systemic VTI:  0.18 m                            Systemic Diam: 2.00 cm Shelda Bruckner MD Electronically signed by Shelda Bruckner MD Signature Date/Time: 09/08/2024/6:27:09 PM    Final    DG FEMUR MIN 2 VIEWS LEFT Result Date: 09/08/2024 CLINICAL DATA:  Elective surgery. EXAM: LEFT FEMUR 2 VIEWS COMPARISON:  Preoperative imaging. FINDINGS: Ten fluoroscopic spot views of the left femur submitted from the operating room. Femoral intramedullary nail with trans trochanteric and distal locking screw fixation traverse  proximal femur fracture. Fluoroscopy time 31 seconds. Dose 3.9139 mGy. IMPRESSION: Intraoperative fluoroscopy during left femur ORIF. Electronically Signed   By: Andrea Gasman M.D.   On: 09/08/2024 17:23   DG C-Arm 1-60 Min-No Report Result Date: 09/08/2024 Fluoroscopy was utilized by the requesting physician.  No radiographic interpretation.   Chest Portable 1 View Result Date: 09/08/2024 EXAM: 1 VIEW XRAY OF THE CHEST 09/07/2024 11:57:00 PM COMPARISON: None available. CLINICAL HISTORY: Preop examination. Pre-op exam FINDINGS: LUNGS AND PLEURA: The lungs  are symmetrically hyperinflated in keeping with changes of underlying COPD. HEART AND MEDIASTINUM: No acute abnormality of the cardiac and mediastinal silhouettes. BONES AND SOFT TISSUES: Healed right rib fractures. No acute osseous abnormality. IMPRESSION: 1. Symmetrically hyperinflated lungs, consistent with underlying COPD. 2. Healed right rib fractures. Electronically signed by: Dorethia Molt MD 09/08/2024 12:00 AM EDT RP Workstation: [id number]   CT Head Wo Contrast Result Date: 09/07/2024 EXAM: CT HEAD AND CERVICAL, THORACIC, AND LUMBAR SPINE 09/07/2024 10:36:15 PM TECHNIQUE: CT of the head and cervical, thoracic, and lumbar spine was performed without the administration of intravenous contrast. Multiplanar reformatted images are provided for review. Automated exposure control, iterative reconstruction, and/or weight based adjustment of the mA/kV was utilized to reduce the radiation dose to as low as reasonably achievable. COMPARISON: CT chest Apr 22, 2014 CLINICAL HISTORY: Head trauma, minor (Age >= 65y). Table formatting from the original note was not included. FINDINGS: CT HEAD BRAIN AND VENTRICLES: No acute intracranial hemorrhage. No mass effect or midline shift. No abnormal extra-axial fluid collection. No evidence of acute infarct. No hydrocephalus. ORBITS: No acute abnormality. SINUSES AND MASTOIDS: No acute abnormality. SOFT TISSUES AND SKULL: No acute skull fracture. No acute soft tissue abnormality. CT CERVICAL, THORACIC, LUMBAR SPINE BONES AND ALIGNMENT: No acute fracture or traumatic malalignment. L4-L5 PLIF. DEGENERATIVE CHANGES: No significant degenerative changes. SOFT TISSUES: No prevertebral soft tissue swelling. Emphysema. Aortic atherosclerosis. IMPRESSION: 1. No acute intracranial abnormality. 2. No acute fracture or traumatic malalignment of the cervical, thoracic, or lumbar spine. Electronically signed by: Gilmore Molt MD 09/07/2024 10:50 PM EDT RP Workstation: [id number]   CT  Cervical Spine Wo Contrast Result Date: 09/07/2024 EXAM: CT HEAD AND CERVICAL, THORACIC, AND LUMBAR SPINE 09/07/2024 10:36:15 PM TECHNIQUE: CT of the head and cervical, thoracic, and lumbar spine was performed without the administration of intravenous contrast. Multiplanar reformatted images are provided for review. Automated exposure control, iterative reconstruction, and/or weight based adjustment of the mA/kV was utilized to reduce the radiation dose to as low as reasonably achievable. COMPARISON: CT chest Apr 22, 2014 CLINICAL HISTORY: Head trauma, minor (Age >= 65y). Table formatting from the original note was not included. FINDINGS: CT HEAD BRAIN AND VENTRICLES: No acute intracranial hemorrhage. No mass effect or midline shift. No abnormal extra-axial fluid collection. No evidence of acute infarct. No hydrocephalus. ORBITS: No acute abnormality. SINUSES AND MASTOIDS: No acute abnormality. SOFT TISSUES AND SKULL: No acute skull fracture. No acute soft tissue abnormality. CT CERVICAL, THORACIC, LUMBAR SPINE BONES AND ALIGNMENT: No acute fracture or traumatic malalignment. L4-L5 PLIF. DEGENERATIVE CHANGES: No significant degenerative changes. SOFT TISSUES: No prevertebral soft tissue swelling. Emphysema. Aortic atherosclerosis. IMPRESSION: 1. No acute intracranial abnormality. 2. No acute fracture or traumatic malalignment of the cervical, thoracic, or lumbar spine. Electronically signed by: Gilmore Molt MD 09/07/2024 10:50 PM EDT RP Workstation: [id number]   CT Lumbar Spine Wo Contrast Result Date: 09/07/2024 EXAM: CT HEAD AND CERVICAL, THORACIC, AND LUMBAR SPINE 09/07/2024 10:36:15 PM TECHNIQUE: CT of the head and  cervical, thoracic, and lumbar spine was performed without the administration of intravenous contrast. Multiplanar reformatted images are provided for review. Automated exposure control, iterative reconstruction, and/or weight based adjustment of the mA/kV was utilized to reduce the radiation  dose to as low as reasonably achievable. COMPARISON: CT chest Apr 22, 2014 CLINICAL HISTORY: Head trauma, minor (Age >= 65y). Table formatting from the original note was not included. FINDINGS: CT HEAD BRAIN AND VENTRICLES: No acute intracranial hemorrhage. No mass effect or midline shift. No abnormal extra-axial fluid collection. No evidence of acute infarct. No hydrocephalus. ORBITS: No acute abnormality. SINUSES AND MASTOIDS: No acute abnormality. SOFT TISSUES AND SKULL: No acute skull fracture. No acute soft tissue abnormality. CT CERVICAL, THORACIC, LUMBAR SPINE BONES AND ALIGNMENT: No acute fracture or traumatic malalignment. L4-L5 PLIF. DEGENERATIVE CHANGES: No significant degenerative changes. SOFT TISSUES: No prevertebral soft tissue swelling. Emphysema. Aortic atherosclerosis. IMPRESSION: 1. No acute intracranial abnormality. 2. No acute fracture or traumatic malalignment of the cervical, thoracic, or lumbar spine. Electronically signed by: Gilmore Molt MD 09/07/2024 10:50 PM EDT RP Workstation: [id number]   CT Thoracic Spine Wo Contrast Result Date: 09/07/2024 EXAM: CT HEAD AND CERVICAL, THORACIC, AND LUMBAR SPINE 09/07/2024 10:36:15 PM TECHNIQUE: CT of the head and cervical, thoracic, and lumbar spine was performed without the administration of intravenous contrast. Multiplanar reformatted images are provided for review. Automated exposure control, iterative reconstruction, and/or weight based adjustment of the mA/kV was utilized to reduce the radiation dose to as low as reasonably achievable. COMPARISON: CT chest Apr 22, 2014 CLINICAL HISTORY: Head trauma, minor (Age >= 65y). Table formatting from the original note was not included. FINDINGS: CT HEAD BRAIN AND VENTRICLES: No acute intracranial hemorrhage. No mass effect or midline shift. No abnormal extra-axial fluid collection. No evidence of acute infarct. No hydrocephalus. ORBITS: No acute abnormality. SINUSES AND MASTOIDS: No acute  abnormality. SOFT TISSUES AND SKULL: No acute skull fracture. No acute soft tissue abnormality. CT CERVICAL, THORACIC, LUMBAR SPINE BONES AND ALIGNMENT: No acute fracture or traumatic malalignment. L4-L5 PLIF. DEGENERATIVE CHANGES: No significant degenerative changes. SOFT TISSUES: No prevertebral soft tissue swelling. Emphysema. Aortic atherosclerosis. IMPRESSION: 1. No acute intracranial abnormality. 2. No acute fracture or traumatic malalignment of the cervical, thoracic, or lumbar spine. Electronically signed by: Gilmore Molt MD 09/07/2024 10:50 PM EDT RP Workstation: [id number]   DG Femur Min 2 Views Left Result Date: 09/07/2024 CLINICAL DATA:  Unwitnessed fall with deformity to the left hip. EXAM: LEFT FEMUR 2 VIEWS; PELVIS - 1-2 VIEW COMPARISON:  None Available. FINDINGS: There is an acute comminuted fracture of the inter trochanteric left proximal femur with mild varus angulation and impaction of the fracture fragments. No dislocation at the hip joint. Degenerative changes in the lower lumbar spine and in both hips. Pelvis appears intact. SI joints and symphysis pubis are not displaced. Postoperative changes in the lower lumbar spine. Vascular calcifications. Midshaft and distal femur appear intact. IMPRESSION: Acute comminuted fracture of the inter trochanteric region of the left proximal femur with mild varus angulation and impaction. Electronically Signed   By: Elsie Gravely M.D.   On: 09/07/2024 20:27   DG Pelvis 1-2 Views Result Date: 09/07/2024 CLINICAL DATA:  Unwitnessed fall with deformity to the left hip. EXAM: LEFT FEMUR 2 VIEWS; PELVIS - 1-2 VIEW COMPARISON:  None Available. FINDINGS: There is an acute comminuted fracture of the inter trochanteric left proximal femur with mild varus angulation and impaction of the fracture fragments. No dislocation at the hip joint. Degenerative changes in  the lower lumbar spine and in both hips. Pelvis appears intact. SI joints and symphysis pubis are  not displaced. Postoperative changes in the lower lumbar spine. Vascular calcifications. Midshaft and distal femur appear intact. IMPRESSION: Acute comminuted fracture of the inter trochanteric region of the left proximal femur with mild varus angulation and impaction. Electronically Signed   By: Elsie Gravely M.D.   On: 09/07/2024 20:27   (Echo, Carotid, EGD, Colonoscopy, ERCP)    Subjective: No complaints  Discharge Exam: Vitals:   09/11/24 2221 09/12/24 0605  BP: (!) 165/67 (!) 176/74  Pulse: 86 84  Resp: 15 14  Temp: 98.1 F (36.7 C) 97.7 F (36.5 C)  SpO2: 99% 100%   Vitals:   09/11/24 0945 09/11/24 1305 09/11/24 2221 09/12/24 0605  BP: (!) 107/50 (!) 136/49 (!) 165/67 (!) 176/74  Pulse: 81 87 86 84  Resp: 18 14 15 14   Temp:  98.1 F (36.7 C) 98.1 F (36.7 C) 97.7 F (36.5 C)  TempSrc:  Oral Oral Oral  SpO2: 100% 98% 99% 100%  Weight:      Height:        General: Pt is alert, awake, not in acute distress Cardiovascular: RRR, S1/S2 +, no rubs, no gallops Respiratory: CTA bilaterally, no wheezing, no rhonchi Abdominal: Soft, NT, ND, bowel sounds + Extremities: no edema, no cyanosis    The results of significant diagnostics from this hospitalization (including imaging, microbiology, ancillary and laboratory) are listed below for reference.     Microbiology: Recent Results (from the past 240 hours)  Surgical pcr screen     Status: None   Collection Time: 09/08/24  3:29 AM   Specimen: Nasal Mucosa; Nasal Swab  Result Value Ref Range Status   MRSA, PCR NEGATIVE NEGATIVE Final   Staphylococcus aureus NEGATIVE NEGATIVE Final    Comment: (NOTE) The Xpert SA Assay (FDA approved for NASAL specimens in patients 22 years of age and older), is one component of a comprehensive surveillance program. It is not intended to diagnose infection nor to guide or monitor treatment. Performed at [facility], [address], [place], [place] [address]       Labs: BNP (last 3 results) No results for input(s): BNP in the last 8760 hours. Basic Metabolic Panel: Recent Labs  Lab 09/07/24 1937 09/08/24 0043 09/08/24 1709 09/09/24 0936  NA 141 140  --  139  K 3.8 4.5  --  4.9  CL 100 101  --  101  CO2 28 29  --  29  GLUCOSE 271* 379*  --  191*  BUN 18 19  --  25*  CREATININE 0.54 0.57 0.60 0.70  CALCIUM  9.4 8.9  --  9.0   Liver Function Tests: No results for input(s): AST, ALT, ALKPHOS, BILITOT, PROT, ALBUMIN in the last 168 hours. No results for input(s): LIPASE, AMYLASE in the last 168 hours. No results for input(s): AMMONIA in the last 168 hours. CBC: Recent Labs  Lab 09/07/24 1937 09/08/24 0043 09/08/24 1709 09/09/24 0936 09/10/24 0326  WBC 9.4 16.5* 16.4* 11.7* 12.3*  NEUTROABS 6.4  --   --   --   --   HGB 13.4 12.5 11.5* 9.5* 8.9*  HCT 42.3 38.5 36.4 31.0* 28.5*  MCV 95.9 94.4 96.8 99.0 97.6  PLT 228 214 231 175 175   Cardiac Enzymes: No results for input(s): CKTOTAL, CKMB, CKMBINDEX, TROPONINI in the last 168 hours. BNP: Invalid input(s): POCBNP CBG: Recent Labs  Lab 09/11/24 1537  09/11/24 1931 09/11/24 2351 09/12/24 0349 09/12/24 0817  GLUCAP 250* 252* 240* 262* 244*   D-Dimer No results for input(s): DDIMER in the last 72 hours. Hgb A1c Recent Labs    09/10/24 0858  HGBA1C 9.0*   Lipid Profile No results for input(s): CHOL, HDL, LDLCALC, TRIG, CHOLHDL, LDLDIRECT in the last 72 hours. Thyroid  function studies No results for input(s): TSH, T4TOTAL, T3FREE, THYROIDAB in the last 72 hours.  Invalid input(s): FREET3 Anemia work up No results for input(s): VITAMINB12, FOLATE, FERRITIN, TIBC, IRON, RETICCTPCT in the last 72 hours. Urinalysis    Component Value Date/Time   COLORURINE YELLOW 11/10/2017 1509   APPEARANCEUR HAZY (A) 11/10/2017 1509   LABSPEC 1.014 11/10/2017 1509   PHURINE 6.0 11/10/2017 1509   GLUCOSEU 50 (A)  11/10/2017 1509   HGBUR LARGE (A) 11/10/2017 1509   BILIRUBINUR NEGATIVE 11/10/2017 1509   KETONESUR 5 (A) 11/10/2017 1509   PROTEINUR 30 (A) 11/10/2017 1509   UROBILINOGEN 0.2 01/12/2009 0757   NITRITE NEGATIVE 11/10/2017 1509   LEUKOCYTESUR SMALL (A) 11/10/2017 1509   Sepsis Labs Recent Labs  Lab 09/08/24 0043 09/08/24 1709 09/09/24 0936 09/10/24 0326  WBC 16.5* 16.4* 11.7* 12.3*   Microbiology Recent Results (from the past 240 hours)  Surgical pcr screen     Status: None   Collection Time: 09/08/24  3:29 AM   Specimen: Nasal Mucosa; Nasal Swab  Result Value Ref Range Status   MRSA, PCR NEGATIVE NEGATIVE Final   Staphylococcus aureus NEGATIVE NEGATIVE Final    Comment: (NOTE) The Xpert SA Assay (FDA approved for NASAL specimens in patients 63 years of age and older), is one component of a comprehensive surveillance program. It is not intended to diagnose infection nor to guide or monitor treatment. Performed at [facility], [address], [place], [place] [address]      Time coordinating discharge: Over 35 minutes  SIGNED:   Erle Odell Castor, MD  [facility] 09/12/2024, 9:16 AM Pager   If 7PM-7AM, please contact night-coverage [email or web address] Password [email or web address]

## 2024-09-12 NOTE — Plan of Care (Signed)
  Problem: Skin Integrity: Goal: Risk for impaired skin integrity will decrease Outcome: Progressing   Problem: Activity: Goal: Risk for activity intolerance will decrease Outcome: Progressing   Problem: Nutrition: Goal: Adequate nutrition will be maintained Outcome: Progressing   Problem: Pain Managment: Goal: General experience of comfort will improve and/or be controlled Outcome: Progressing   Problem: Safety: Goal: Ability to remain free from injury will improve Outcome: Progressing

## 2024-09-12 NOTE — Plan of Care (Signed)
  Problem: Tissue Perfusion: Goal: Adequacy of tissue perfusion will improve Outcome: Progressing   Problem: Clinical Measurements: Goal: Will remain free from infection Outcome: Progressing Goal: Respiratory complications will improve Outcome: Progressing Goal: Cardiovascular complication will be avoided Outcome: Progressing   Problem: Coping: Goal: Level of anxiety will decrease Outcome: Progressing   Problem: Pain Managment: Goal: General experience of comfort will improve and/or be controlled Outcome: Progressing   Problem: Safety: Goal: Ability to remain free from injury will improve Outcome: Progressing

## 2024-09-12 NOTE — Progress Notes (Signed)
 PHYSICAL THERAPY  Patient Details Name: Doris Wyatt MRN: [id number] DOB: 05/13/1946   Cancelled Treatment:     Per Nursing, Pt remains confused.  Pt has been evaluated with rec for SNF.  Rehab Team to continue to follow and attempt to see another day as schedule permits. Katheryn Leap  PTA Acute  Rehabilitation Services Office M-F          [phone]

## 2024-09-13 LAB — GLUCOSE, CAPILLARY
Glucose-Capillary: 282 mg/dL — ABNORMAL HIGH (ref 70–99)
Glucose-Capillary: 194 mg/dL — ABNORMAL HIGH (ref 70–99)
Glucose-Capillary: 289 mg/dL — ABNORMAL HIGH (ref 70–99)
Glucose-Capillary: 204 mg/dL — ABNORMAL HIGH (ref 70–99)

## 2024-09-13 MED ORDER — SODIUM CHLORIDE 0.9 % IV BOLUS
500.0000 mL | Freq: Once | INTRAVENOUS | Status: AC
  Administered 2024-09-13: 500 mL via INTRAVENOUS

## 2024-09-13 MED ORDER — ORAL CARE MOUTH RINSE: 15.0000 mL | OROMUCOSAL | Status: DC | PRN

## 2024-09-13 MED ORDER — HYDROCODONE-ACETAMINOPHEN 5-325 MG PO TABS
1.0000 | ORAL_TABLET | Freq: Once | ORAL | Status: AC
  Administered 2024-09-13: 1 via ORAL
  Filled 2024-09-13: qty 1

## 2024-09-13 NOTE — Care Management Important Message (Signed)
 Important Message  Patient Details IM Letter given. Name: Doris Wyatt MRN: [id number] Date of Birth: 1946-07-08   Important Message Given:  Yes - Medicare IM     Melba Ates 09/13/2024, 10:37 AM

## 2024-09-13 NOTE — Plan of Care (Signed)

## 2024-09-13 NOTE — Progress Notes (Signed)
 Physical Therapy Treatment Patient Details Name: Doris Wyatt MRN: [id number] DOB: 04/23/1946 Today's Date: 09/13/2024   History of Present Illness Pt admitted 09/07/24 s/p fall with L hip fx and now s/p intrameullary rod insertion 09/08/24.  Pt with hx of COPD, MDD, Alzheimers, DM, back surgery, and osteoporosis    PT Comments  Pt with improved transfers but then ultimately limited by orthostatic hypotension with syncopal symptoms.  Still requiring assist of 2 for transfers but improved effort from pt and stood briefly until syncopal symptoms.  Pt does yell with movement but daughter and therapist encouraging pt to work on mobility. Daughter reports pt did much better moving compared to last session.  Cont POC.  Patient will benefit from continued inpatient follow up therapy, <3 hours/day     If plan is discharge home, recommend the following: Two people to help with walking and/or transfers;Two people to help with bathing/dressing/bathroom;Assist for transportation;Help with stairs or ramp for entrance;Assistance with cooking/housework   Can travel by Doctor, hospital cushion (measurements PT);Wheelchair (measurements PT)    Recommendations for Other Services       Precautions / Restrictions Precautions Precautions: Fall Restrictions LLE Weight Bearing Per Provider Order: Weight bearing as tolerated     Mobility  Bed Mobility Overal bed mobility: Needs Assistance Bed Mobility: Rolling, Supine to Sit, Sit to Supine Rolling: +2 for physical assistance, Mod assist   Supine to sit: Mod assist, +2 for physical assistance Sit to supine: Total assist, +2 for physical assistance (due to syncope)   General bed mobility comments: Pt requiring increased time and cues for all.  She also would scream stop but educated on need to move and daughter also encouraged pt to continue.  Improved effort with transition to EOB but due to syncope  total A back to bed.    Transfers Overall transfer level: Needs assistance Equipment used: Rolling walker (2 wheels) Transfers: Sit to/from Stand Sit to Stand: Mod assist, +2 physical assistance           General transfer comment: Pt with increased pain and fear with attempt to stand requiring increased cues and time.  Required assist at buttock to stand straight and assist to get hands on walker handles.  Once using handles pt was able to support self with weight on arms and shifted right. Pt stood briefly and then with buckling and had to return to sitting due to syncope.    Ambulation/Gait               General Gait Details: unable due to syncope   Stairs             Wheelchair Mobility     Tilt Bed    Modified Rankin (Stroke Patients Only)       Balance Overall balance assessment: Needs assistance Sitting-balance support: Bilateral upper extremity supported, Feet supported Sitting balance-Leahy Scale: Poor Sitting balance - Comments: needs UE support and shifts R to offload   Standing balance support: Bilateral upper extremity supported, During functional activity, Reliant on assistive device for balance Standing balance-Leahy Scale: Poor Standing balance comment: reliant on RW and once upright min A (mod A x 2 to get there)                            Communication    Cognition Arousal: Alert Behavior During Therapy: Anxious   PT -  Cognitive impairments: History of cognitive impairments                       PT - Cognition Comments: Alert today (avoiding narcotic per family and RN).  Oriented to self.  Daughter present.  Pt often yelling stop , your hurting me.  Therapist and daughter attempting to comfort and encourage pt.        Cueing    Exercises General Exercises - Lower Extremity Ankle Circles/Pumps: AAROM, Left, 10 reps, Limitations Ankle Circles/Pumps Limitations: gentle ankle ROM Heel Slides: 10 reps, Supine,  AAROM, Left    General Comments General comments (skin integrity, edema, etc.):   Pt sat EOB and then stood.  In standing pt began buckling at  knees and slumping over.  Returned to sitting , less responsive, eyes open but looking up, and tense.  Returned to supine and began to arouse.  Tech retrieved dynamap and initial bp was 126/111 but arm tense, once relaxed was 142/62.  Once pt improved attempted sitting again to assess BP.  Took transfer slow and cues for deep breaths/relaxation.  Upon sitting pt reports nauseated again, while check BP pt reports feeling faint.  BP was 97/76 so pt returned supine and repositioned.  BP up to 119/57 in supine.  HR was stable throguhout.    Pt had urinated during transfers.  Required assist for rolling and cleaning.    Additionally, dtr reports pt now complaining of L ankle pain, noted xray negative.  Pain was lateral ankle and foot - suspect sprain      Pertinent Vitals/Pain Pain Assessment Pain Assessment: Faces Faces Pain Scale: Hurts whole lot Pain Location: LLE with movement Pain Descriptors / Indicators: Grimacing, Guarding, Moaning (pt yelling) Pain Intervention(s): Limited activity within patient's tolerance, Monitored during session, Premedicated before session, Repositioned (Daughter present and reports pt will yell, she encouraged pt to participate and that she would be painful.  Pt did have medication prior to eval but noted that trying to avoid narcotics for mental status and alertness)    Home Living                          Prior Function            PT Goals (current goals can now be found in the care plan section) Progress towards PT goals: Progressing toward goals    Frequency    Min 3X/week      PT Plan      Co-evaluation              AM-PAC PT 6 Clicks Mobility   Outcome Measure  Help needed turning from your back to your side while in a flat bed without using bedrails?: A Lot Help needed moving  from lying on your back to sitting on the side of a flat bed without using bedrails?: Total Help needed moving to and from a bed to a chair (including a wheelchair)?: Total Help needed standing up from a chair using your arms (e.g., wheelchair or bedside chair)?: Total Help needed to walk in hospital room?: Total Help needed climbing 3-5 steps with a railing? : Total 6 Click Score: 7    End of Session Equipment Utilized During Treatment: Gait belt Activity Tolerance: Treatment limited secondary to medical complications (Comment) Patient left: in bed;with nursing/sitter in room;with family/visitor present;with bed alarm set Nurse Communication: Mobility status;Other (comment) (RN present due to orthostatic hypotension) PT  Visit Diagnosis: Difficulty in walking, not elsewhere classified (R26.2);Muscle weakness (generalized) (M62.81);History of falling (Z91.81);Pain Pain - Right/Left: Left Pain - part of body: Hip;Leg     Time: 1230-1310 PT Time Calculation (min) (ACUTE ONLY): 40 min  Charges:    $Therapeutic Activity: 38-52 mins PT General Charges $$ ACUTE PT VISIT: 1 Visit                     Benjiman, PT Acute Rehab Services [facility] [phone]    Benjiman VEAR Mulberry 09/13/2024, 2:07 PM

## 2024-09-13 NOTE — Progress Notes (Signed)
 Patient was supposed to be discharged to SNF on 09/12/2024 but is currently awaiting SNF placement.  Patient is currently medically stable for discharge.  Patient seen and examined at bedside today by myself and plan of care discussed with her and daughter at bedside.  Please refer to the full discharge summary done by Dr. Odell Castor on 09/12/2024 for full details.

## 2024-09-13 NOTE — TOC Progression Note (Signed)
 Transition of Care [facility]) - Progression Note    Patient Details  Name: Doris Wyatt MRN: [id number] Date of Birth: 1946/02/01  [facility] CM/SW Contact  NORMAN ASPEN, LCSW Phone Number: 09/13/2024, 3:08 PM  Clinical Narrative:     Pt/ family have accepted SNF bed at [facility] and can admit pt tomorrow if medically cleared.  Have received insurance authorization.  MD aware.  Expected Discharge Plan: Skilled Nursing Facility Barriers to Discharge: Continued Medical Work up               Expected Discharge Plan and Services In-house Referral: NA Discharge Planning Services: NA Post Acute Care Choice: Skilled Nursing Facility Living arrangements for the past 2 months: Single Family Home Expected Discharge Date: 09/14/24               DME Arranged: N/A DME Agency: NA       HH Arranged: NA HH Agency: NA         Social Drivers of Health (SDOH) Interventions SDOH Screenings   Food Insecurity: No Food Insecurity (09/08/2024)  Housing: Low Risk  (09/08/2024)  Transportation Needs: No Transportation Needs (09/08/2024)  Utilities: Not At Risk (09/08/2024)  Depression (PHQ2-9): Low Risk  (11/16/2019)  Social Connections: Unknown (09/08/2024)  Tobacco Use: Medium Risk (09/08/2024)    Readmission Risk Interventions    09/09/2024   12:13 PM  Readmission Risk Prevention Plan  Post Dischage Appt Complete  Medication Screening Complete  Transportation Screening Complete

## 2024-09-14 DIAGNOSIS — Z885 Allergy status to narcotic agent status: Secondary | ICD-10-CM | POA: Diagnosis not present

## 2024-09-14 DIAGNOSIS — K5900 Constipation, unspecified: Secondary | ICD-10-CM | POA: Diagnosis not present

## 2024-09-14 DIAGNOSIS — Z7189 Other specified counseling: Secondary | ICD-10-CM | POA: Diagnosis not present

## 2024-09-14 DIAGNOSIS — S72142A Displaced intertrochanteric fracture of left femur, initial encounter for closed fracture: Secondary | ICD-10-CM | POA: Diagnosis not present

## 2024-09-14 DIAGNOSIS — E43 Unspecified severe protein-calorie malnutrition: Secondary | ICD-10-CM | POA: Diagnosis present

## 2024-09-14 DIAGNOSIS — S72342D Displaced spiral fracture of shaft of left femur, subsequent encounter for closed fracture with routine healing: Secondary | ICD-10-CM | POA: Diagnosis not present

## 2024-09-14 DIAGNOSIS — S7292XA Unspecified fracture of left femur, initial encounter for closed fracture: Secondary | ICD-10-CM | POA: Diagnosis not present

## 2024-09-14 DIAGNOSIS — E119 Type 2 diabetes mellitus without complications: Secondary | ICD-10-CM | POA: Diagnosis not present

## 2024-09-14 DIAGNOSIS — R2681 Unsteadiness on feet: Secondary | ICD-10-CM | POA: Diagnosis not present

## 2024-09-14 DIAGNOSIS — E11649 Type 2 diabetes mellitus with hypoglycemia without coma: Secondary | ICD-10-CM | POA: Diagnosis present

## 2024-09-14 DIAGNOSIS — S72392A Other fracture of shaft of left femur, initial encounter for closed fracture: Secondary | ICD-10-CM | POA: Diagnosis not present

## 2024-09-14 DIAGNOSIS — Z681 Body mass index (BMI) 19 or less, adult: Secondary | ICD-10-CM | POA: Diagnosis not present

## 2024-09-14 DIAGNOSIS — E785 Hyperlipidemia, unspecified: Secondary | ICD-10-CM | POA: Diagnosis not present

## 2024-09-14 DIAGNOSIS — E7849 Other hyperlipidemia: Secondary | ICD-10-CM | POA: Diagnosis present

## 2024-09-14 DIAGNOSIS — Z7401 Bed confinement status: Secondary | ICD-10-CM | POA: Diagnosis not present

## 2024-09-14 DIAGNOSIS — S2231XA Fracture of one rib, right side, initial encounter for closed fracture: Secondary | ICD-10-CM | POA: Diagnosis not present

## 2024-09-14 DIAGNOSIS — S72302A Unspecified fracture of shaft of left femur, initial encounter for closed fracture: Secondary | ICD-10-CM | POA: Diagnosis present

## 2024-09-14 DIAGNOSIS — Z7982 Long term (current) use of aspirin: Secondary | ICD-10-CM | POA: Diagnosis not present

## 2024-09-14 DIAGNOSIS — R41841 Cognitive communication deficit: Secondary | ICD-10-CM | POA: Diagnosis not present

## 2024-09-14 DIAGNOSIS — Z01810 Encounter for preprocedural cardiovascular examination: Secondary | ICD-10-CM | POA: Diagnosis not present

## 2024-09-14 DIAGNOSIS — E7800 Pure hypercholesterolemia, unspecified: Secondary | ICD-10-CM | POA: Diagnosis present

## 2024-09-14 DIAGNOSIS — W19XXXA Unspecified fall, initial encounter: Secondary | ICD-10-CM | POA: Diagnosis present

## 2024-09-14 DIAGNOSIS — M9702XA Periprosthetic fracture around internal prosthetic left hip joint, initial encounter: Secondary | ICD-10-CM | POA: Diagnosis present

## 2024-09-14 DIAGNOSIS — M80052A Age-related osteoporosis with current pathological fracture, left femur, initial encounter for fracture: Secondary | ICD-10-CM | POA: Diagnosis present

## 2024-09-14 DIAGNOSIS — Z79899 Other long term (current) drug therapy: Secondary | ICD-10-CM | POA: Diagnosis not present

## 2024-09-14 DIAGNOSIS — E1169 Type 2 diabetes mellitus with other specified complication: Secondary | ICD-10-CM | POA: Diagnosis present

## 2024-09-14 DIAGNOSIS — M25552 Pain in left hip: Secondary | ICD-10-CM | POA: Diagnosis not present

## 2024-09-14 DIAGNOSIS — Z794 Long term (current) use of insulin: Secondary | ICD-10-CM | POA: Diagnosis not present

## 2024-09-14 DIAGNOSIS — N390 Urinary tract infection, site not specified: Secondary | ICD-10-CM | POA: Diagnosis not present

## 2024-09-14 DIAGNOSIS — J410 Simple chronic bronchitis: Secondary | ICD-10-CM | POA: Diagnosis not present

## 2024-09-14 DIAGNOSIS — I10 Essential (primary) hypertension: Secondary | ICD-10-CM | POA: Diagnosis not present

## 2024-09-14 DIAGNOSIS — Z7984 Long term (current) use of oral hypoglycemic drugs: Secondary | ICD-10-CM | POA: Diagnosis not present

## 2024-09-14 DIAGNOSIS — Z888 Allergy status to other drugs, medicaments and biological substances status: Secondary | ICD-10-CM | POA: Diagnosis not present

## 2024-09-14 DIAGNOSIS — F411 Generalized anxiety disorder: Secondary | ICD-10-CM | POA: Diagnosis present

## 2024-09-14 DIAGNOSIS — R109 Unspecified abdominal pain: Secondary | ICD-10-CM | POA: Diagnosis not present

## 2024-09-14 DIAGNOSIS — I152 Hypertension secondary to endocrine disorders: Secondary | ICD-10-CM | POA: Diagnosis present

## 2024-09-14 DIAGNOSIS — D62 Acute posthemorrhagic anemia: Secondary | ICD-10-CM | POA: Diagnosis not present

## 2024-09-14 DIAGNOSIS — J439 Emphysema, unspecified: Secondary | ICD-10-CM | POA: Diagnosis not present

## 2024-09-14 DIAGNOSIS — F0280 Dementia in other diseases classified elsewhere without behavioral disturbance: Secondary | ICD-10-CM | POA: Diagnosis not present

## 2024-09-14 DIAGNOSIS — Z87891 Personal history of nicotine dependence: Secondary | ICD-10-CM | POA: Diagnosis not present

## 2024-09-14 DIAGNOSIS — S72002A Fracture of unspecified part of neck of left femur, initial encounter for closed fracture: Secondary | ICD-10-CM | POA: Diagnosis not present

## 2024-09-14 DIAGNOSIS — M1990 Unspecified osteoarthritis, unspecified site: Secondary | ICD-10-CM | POA: Diagnosis present

## 2024-09-14 DIAGNOSIS — R630 Anorexia: Secondary | ICD-10-CM | POA: Diagnosis not present

## 2024-09-14 DIAGNOSIS — G309 Alzheimer's disease, unspecified: Secondary | ICD-10-CM | POA: Diagnosis present

## 2024-09-14 DIAGNOSIS — S72342A Displaced spiral fracture of shaft of left femur, initial encounter for closed fracture: Secondary | ICD-10-CM | POA: Diagnosis not present

## 2024-09-14 DIAGNOSIS — B961 Klebsiella pneumoniae [K. pneumoniae] as the cause of diseases classified elsewhere: Secondary | ICD-10-CM | POA: Diagnosis not present

## 2024-09-14 DIAGNOSIS — E1159 Type 2 diabetes mellitus with other circulatory complications: Secondary | ICD-10-CM | POA: Diagnosis present

## 2024-09-14 DIAGNOSIS — I700 Atherosclerosis of aorta: Secondary | ICD-10-CM | POA: Diagnosis not present

## 2024-09-14 DIAGNOSIS — S72142D Displaced intertrochanteric fracture of left femur, subsequent encounter for closed fracture with routine healing: Secondary | ICD-10-CM | POA: Diagnosis not present

## 2024-09-14 DIAGNOSIS — Z751 Person awaiting admission to adequate facility elsewhere: Secondary | ICD-10-CM | POA: Diagnosis not present

## 2024-09-14 DIAGNOSIS — E1165 Type 2 diabetes mellitus with hyperglycemia: Secondary | ICD-10-CM | POA: Diagnosis present

## 2024-09-14 DIAGNOSIS — M6259 Muscle wasting and atrophy, not elsewhere classified, multiple sites: Secondary | ICD-10-CM | POA: Diagnosis not present

## 2024-09-14 DIAGNOSIS — J449 Chronic obstructive pulmonary disease, unspecified: Secondary | ICD-10-CM | POA: Diagnosis present

## 2024-09-14 LAB — GLUCOSE, CAPILLARY
Glucose-Capillary: 256 mg/dL — ABNORMAL HIGH (ref 70–99)
Glucose-Capillary: 253 mg/dL — ABNORMAL HIGH (ref 70–99)

## 2024-09-14 NOTE — TOC Transition Note (Signed)
 Transition of Care [facility]) - Discharge Note   Patient Details  Name: Doris Wyatt MRN: [id number] Date of Birth: March 15, 1946  [facility] CM/SW Contact:  NORMAN ASPEN, LCSW Phone Number: 09/14/2024, 12:14 PM   Clinical Narrative:    Pt medically cleared for dc today to [facility] and have received insurance authorization.  [facility] to be called at 1pm (per facility request).  RN to call report to [phone].   No further IP CM needs.   Final next level of care: Skilled Nursing Facility Barriers to Discharge: Barriers Resolved   Patient Goals and CMS Choice Patient states their goals for this hospitalization and ongoing recovery are:: return home   Choice offered to / list presented to : Adult Children [place] ownership interest in [facility].provided to:: Adult Children    Discharge Placement              Patient chooses bed at: [facility] Patient to be transferred to facility by: PTAR Name of family member notified: daughter Patient and family notified of of transfer: 09/14/24  Discharge Plan and Services Additional resources added to the After Visit Summary for   In-house Referral: NA Discharge Planning Services: NA Post Acute Care Choice: Skilled Nursing Facility          DME Arranged: N/A DME Agency: NA       HH Arranged: NA HH Agency: NA        Social Drivers of Health (SDOH) Interventions SDOH Screenings   Food Insecurity: No Food Insecurity (09/08/2024)  Housing: Low Risk  (09/08/2024)  Transportation Needs: No Transportation Needs (09/08/2024)  Utilities: Not At Risk (09/08/2024)  Depression (PHQ2-9): Low Risk  (11/16/2019)  Social Connections: Unknown (09/08/2024)  Tobacco Use: Medium Risk (09/08/2024)     Readmission Risk Interventions    09/09/2024   12:13 PM  Readmission Risk Prevention Plan  Post Dischage Appt Complete  Medication Screening Complete  Transportation Screening Complete

## 2024-09-14 NOTE — Progress Notes (Signed)
 Discharge summary  Discharge date: 09/14/2024 Discharge disposition: SNF  Patient was supposed to be discharged on 09/12/2024 to SNF but is currently waiting for SNF care.  She is currently medically stable for discharge.  Patient seen and examined at bedside again today.  Please refer to the full discharge summary done by Dr. Hezzie Castor on 09/12/2024 for full details.

## 2024-09-18 DIAGNOSIS — M6281 Muscle weakness (generalized): Secondary | ICD-10-CM | POA: Diagnosis not present

## 2024-09-18 DIAGNOSIS — R2681 Unsteadiness on feet: Secondary | ICD-10-CM | POA: Diagnosis not present

## 2024-09-18 DIAGNOSIS — M810 Age-related osteoporosis without current pathological fracture: Secondary | ICD-10-CM | POA: Diagnosis not present

## 2024-09-18 DIAGNOSIS — E43 Unspecified severe protein-calorie malnutrition: Secondary | ICD-10-CM | POA: Diagnosis not present

## 2024-09-18 DIAGNOSIS — R109 Unspecified abdominal pain: Secondary | ICD-10-CM | POA: Diagnosis not present

## 2024-09-18 DIAGNOSIS — S72142D Displaced intertrochanteric fracture of left femur, subsequent encounter for closed fracture with routine healing: Secondary | ICD-10-CM | POA: Diagnosis not present

## 2024-09-18 DIAGNOSIS — F0280 Dementia in other diseases classified elsewhere without behavioral disturbance: Secondary | ICD-10-CM | POA: Diagnosis not present

## 2024-09-18 DIAGNOSIS — G309 Alzheimer's disease, unspecified: Secondary | ICD-10-CM | POA: Diagnosis not present

## 2024-09-18 DIAGNOSIS — I152 Hypertension secondary to endocrine disorders: Secondary | ICD-10-CM | POA: Diagnosis not present

## 2024-09-18 DIAGNOSIS — J449 Chronic obstructive pulmonary disease, unspecified: Secondary | ICD-10-CM | POA: Diagnosis not present

## 2024-09-18 DIAGNOSIS — F411 Generalized anxiety disorder: Secondary | ICD-10-CM | POA: Diagnosis not present

## 2024-09-20 DIAGNOSIS — G309 Alzheimer's disease, unspecified: Secondary | ICD-10-CM | POA: Diagnosis not present

## 2024-09-20 DIAGNOSIS — M6281 Muscle weakness (generalized): Secondary | ICD-10-CM | POA: Diagnosis not present

## 2024-09-20 DIAGNOSIS — S72142D Displaced intertrochanteric fracture of left femur, subsequent encounter for closed fracture with routine healing: Secondary | ICD-10-CM | POA: Diagnosis not present

## 2024-09-20 DIAGNOSIS — R2681 Unsteadiness on feet: Secondary | ICD-10-CM | POA: Diagnosis not present

## 2024-09-20 DIAGNOSIS — R109 Unspecified abdominal pain: Secondary | ICD-10-CM | POA: Diagnosis not present

## 2024-09-25 DIAGNOSIS — R109 Unspecified abdominal pain: Secondary | ICD-10-CM | POA: Diagnosis not present

## 2024-09-25 DIAGNOSIS — F411 Generalized anxiety disorder: Secondary | ICD-10-CM | POA: Diagnosis not present

## 2024-09-25 DIAGNOSIS — S72142D Displaced intertrochanteric fracture of left femur, subsequent encounter for closed fracture with routine healing: Secondary | ICD-10-CM | POA: Diagnosis not present

## 2024-09-25 DIAGNOSIS — K5900 Constipation, unspecified: Secondary | ICD-10-CM | POA: Diagnosis not present

## 2024-09-25 DIAGNOSIS — Z7189 Other specified counseling: Secondary | ICD-10-CM | POA: Diagnosis not present

## 2024-09-25 DIAGNOSIS — D75839 Thrombocytosis, unspecified: Secondary | ICD-10-CM | POA: Diagnosis not present

## 2024-09-25 DIAGNOSIS — J449 Chronic obstructive pulmonary disease, unspecified: Secondary | ICD-10-CM | POA: Diagnosis not present

## 2024-09-25 DIAGNOSIS — D62 Acute posthemorrhagic anemia: Secondary | ICD-10-CM | POA: Diagnosis not present

## 2024-09-25 DIAGNOSIS — R630 Anorexia: Secondary | ICD-10-CM | POA: Diagnosis not present

## 2024-09-26 DIAGNOSIS — M898X5 Other specified disorders of bone, thigh: Secondary | ICD-10-CM | POA: Diagnosis not present

## 2024-09-27 DIAGNOSIS — M6281 Muscle weakness (generalized): Secondary | ICD-10-CM | POA: Diagnosis not present

## 2024-09-27 DIAGNOSIS — Z79899 Other long term (current) drug therapy: Secondary | ICD-10-CM | POA: Diagnosis not present

## 2024-09-27 DIAGNOSIS — D62 Acute posthemorrhagic anemia: Secondary | ICD-10-CM | POA: Diagnosis not present

## 2024-09-27 DIAGNOSIS — Z7901 Long term (current) use of anticoagulants: Secondary | ICD-10-CM | POA: Diagnosis not present

## 2024-09-27 DIAGNOSIS — R109 Unspecified abdominal pain: Secondary | ICD-10-CM | POA: Diagnosis not present

## 2024-09-27 DIAGNOSIS — G309 Alzheimer's disease, unspecified: Secondary | ICD-10-CM | POA: Diagnosis not present

## 2024-09-27 DIAGNOSIS — Z7984 Long term (current) use of oral hypoglycemic drugs: Secondary | ICD-10-CM | POA: Diagnosis not present

## 2024-09-27 DIAGNOSIS — W19XXXA Unspecified fall, initial encounter: Secondary | ICD-10-CM | POA: Diagnosis not present

## 2024-09-27 DIAGNOSIS — S72142D Displaced intertrochanteric fracture of left femur, subsequent encounter for closed fracture with routine healing: Secondary | ICD-10-CM | POA: Diagnosis not present

## 2024-09-27 DIAGNOSIS — R2681 Unsteadiness on feet: Secondary | ICD-10-CM | POA: Diagnosis not present

## 2024-09-27 DIAGNOSIS — K5900 Constipation, unspecified: Secondary | ICD-10-CM | POA: Diagnosis not present

## 2024-09-27 DIAGNOSIS — R630 Anorexia: Secondary | ICD-10-CM | POA: Diagnosis not present

## 2024-09-27 DIAGNOSIS — E1165 Type 2 diabetes mellitus with hyperglycemia: Secondary | ICD-10-CM | POA: Diagnosis not present

## 2024-09-28 ENCOUNTER — Emergency Department (HOSPITAL_COMMUNITY)

## 2024-09-28 ENCOUNTER — Inpatient Hospital Stay (HOSPITAL_COMMUNITY)
Admission: EM | Admit: 2024-09-28 | Discharge: 2024-10-13 | DRG: 480 | Disposition: A | Discharge status: Skilled Nursing Facility | Source: Skilled Nursing Facility | Attending: Internal Medicine | Admitting: Internal Medicine

## 2024-09-28 ENCOUNTER — Inpatient Hospital Stay (HOSPITAL_COMMUNITY)

## 2024-09-28 ENCOUNTER — Encounter (HOSPITAL_COMMUNITY): Payer: Self-pay

## 2024-09-28 ENCOUNTER — Other Ambulatory Visit: Payer: Self-pay

## 2024-09-28 DIAGNOSIS — Z981 Arthrodesis status: Secondary | ICD-10-CM | POA: Diagnosis not present

## 2024-09-28 DIAGNOSIS — B961 Klebsiella pneumoniae [K. pneumoniae] as the cause of diseases classified elsewhere: Secondary | ICD-10-CM | POA: Diagnosis not present

## 2024-09-28 DIAGNOSIS — S72142A Displaced intertrochanteric fracture of left femur, initial encounter for closed fracture: Secondary | ICD-10-CM | POA: Diagnosis not present

## 2024-09-28 DIAGNOSIS — M810 Age-related osteoporosis without current pathological fracture: Secondary | ICD-10-CM | POA: Diagnosis present

## 2024-09-28 DIAGNOSIS — E1169 Type 2 diabetes mellitus with other specified complication: Secondary | ICD-10-CM | POA: Diagnosis present

## 2024-09-28 DIAGNOSIS — E119 Type 2 diabetes mellitus without complications: Secondary | ICD-10-CM | POA: Diagnosis not present

## 2024-09-28 DIAGNOSIS — Z751 Person awaiting admission to adequate facility elsewhere: Secondary | ICD-10-CM

## 2024-09-28 DIAGNOSIS — Z01810 Encounter for preprocedural cardiovascular examination: Secondary | ICD-10-CM | POA: Diagnosis not present

## 2024-09-28 DIAGNOSIS — E1159 Type 2 diabetes mellitus with other circulatory complications: Secondary | ICD-10-CM | POA: Diagnosis present

## 2024-09-28 DIAGNOSIS — Z885 Allergy status to narcotic agent status: Secondary | ICD-10-CM | POA: Diagnosis not present

## 2024-09-28 DIAGNOSIS — Z681 Body mass index (BMI) 19 or less, adult: Secondary | ICD-10-CM | POA: Diagnosis not present

## 2024-09-28 DIAGNOSIS — Z888 Allergy status to other drugs, medicaments and biological substances status: Secondary | ICD-10-CM | POA: Diagnosis not present

## 2024-09-28 DIAGNOSIS — Z7984 Long term (current) use of oral hypoglycemic drugs: Secondary | ICD-10-CM | POA: Diagnosis not present

## 2024-09-28 DIAGNOSIS — S72302A Unspecified fracture of shaft of left femur, initial encounter for closed fracture: Secondary | ICD-10-CM | POA: Diagnosis present

## 2024-09-28 DIAGNOSIS — M25552 Pain in left hip: Secondary | ICD-10-CM | POA: Diagnosis not present

## 2024-09-28 DIAGNOSIS — N390 Urinary tract infection, site not specified: Secondary | ICD-10-CM | POA: Diagnosis not present

## 2024-09-28 DIAGNOSIS — G309 Alzheimer's disease, unspecified: Secondary | ICD-10-CM | POA: Diagnosis present

## 2024-09-28 DIAGNOSIS — E7800 Pure hypercholesterolemia, unspecified: Secondary | ICD-10-CM | POA: Diagnosis present

## 2024-09-28 DIAGNOSIS — Z794 Long term (current) use of insulin: Secondary | ICD-10-CM | POA: Diagnosis not present

## 2024-09-28 DIAGNOSIS — Z7982 Long term (current) use of aspirin: Secondary | ICD-10-CM

## 2024-09-28 DIAGNOSIS — M6281 Muscle weakness (generalized): Secondary | ICD-10-CM | POA: Diagnosis not present

## 2024-09-28 DIAGNOSIS — S72302D Unspecified fracture of shaft of left femur, subsequent encounter for closed fracture with routine healing: Secondary | ICD-10-CM | POA: Diagnosis not present

## 2024-09-28 DIAGNOSIS — F0280 Dementia in other diseases classified elsewhere without behavioral disturbance: Secondary | ICD-10-CM | POA: Diagnosis present

## 2024-09-28 DIAGNOSIS — Z741 Need for assistance with personal care: Secondary | ICD-10-CM | POA: Diagnosis not present

## 2024-09-28 DIAGNOSIS — Z87891 Personal history of nicotine dependence: Secondary | ICD-10-CM

## 2024-09-28 DIAGNOSIS — F411 Generalized anxiety disorder: Secondary | ICD-10-CM | POA: Diagnosis present

## 2024-09-28 DIAGNOSIS — I700 Atherosclerosis of aorta: Secondary | ICD-10-CM | POA: Diagnosis not present

## 2024-09-28 DIAGNOSIS — I152 Hypertension secondary to endocrine disorders: Secondary | ICD-10-CM | POA: Diagnosis present

## 2024-09-28 DIAGNOSIS — S7292XA Unspecified fracture of left femur, initial encounter for closed fracture: Secondary | ICD-10-CM | POA: Diagnosis not present

## 2024-09-28 DIAGNOSIS — E1165 Type 2 diabetes mellitus with hyperglycemia: Secondary | ICD-10-CM | POA: Diagnosis not present

## 2024-09-28 DIAGNOSIS — Z0189 Encounter for other specified special examinations: Secondary | ICD-10-CM | POA: Diagnosis not present

## 2024-09-28 DIAGNOSIS — W19XXXA Unspecified fall, initial encounter: Principal | ICD-10-CM | POA: Diagnosis present

## 2024-09-28 DIAGNOSIS — R531 Weakness: Secondary | ICD-10-CM | POA: Diagnosis not present

## 2024-09-28 DIAGNOSIS — M9702XA Periprosthetic fracture around internal prosthetic left hip joint, initial encounter: Secondary | ICD-10-CM | POA: Diagnosis present

## 2024-09-28 DIAGNOSIS — J449 Chronic obstructive pulmonary disease, unspecified: Secondary | ICD-10-CM | POA: Diagnosis present

## 2024-09-28 DIAGNOSIS — R2689 Other abnormalities of gait and mobility: Secondary | ICD-10-CM | POA: Diagnosis not present

## 2024-09-28 DIAGNOSIS — S72342D Displaced spiral fracture of shaft of left femur, subsequent encounter for closed fracture with routine healing: Secondary | ICD-10-CM | POA: Diagnosis not present

## 2024-09-28 DIAGNOSIS — S72342A Displaced spiral fracture of shaft of left femur, initial encounter for closed fracture: Secondary | ICD-10-CM | POA: Diagnosis not present

## 2024-09-28 DIAGNOSIS — E11649 Type 2 diabetes mellitus with hypoglycemia without coma: Secondary | ICD-10-CM | POA: Diagnosis present

## 2024-09-28 DIAGNOSIS — S2231XA Fracture of one rib, right side, initial encounter for closed fracture: Secondary | ICD-10-CM | POA: Diagnosis not present

## 2024-09-28 DIAGNOSIS — E7849 Other hyperlipidemia: Secondary | ICD-10-CM | POA: Diagnosis present

## 2024-09-28 DIAGNOSIS — Z79899 Other long term (current) drug therapy: Secondary | ICD-10-CM

## 2024-09-28 DIAGNOSIS — J410 Simple chronic bronchitis: Secondary | ICD-10-CM | POA: Diagnosis not present

## 2024-09-28 DIAGNOSIS — J439 Emphysema, unspecified: Secondary | ICD-10-CM | POA: Diagnosis not present

## 2024-09-28 DIAGNOSIS — R8290 Unspecified abnormal findings in urine: Secondary | ICD-10-CM | POA: Clinically undetermined

## 2024-09-28 DIAGNOSIS — E785 Hyperlipidemia, unspecified: Secondary | ICD-10-CM | POA: Diagnosis not present

## 2024-09-28 DIAGNOSIS — S72002A Fracture of unspecified part of neck of left femur, initial encounter for closed fracture: Secondary | ICD-10-CM | POA: Diagnosis not present

## 2024-09-28 DIAGNOSIS — S72392A Other fracture of shaft of left femur, initial encounter for closed fracture: Secondary | ICD-10-CM | POA: Diagnosis not present

## 2024-09-28 DIAGNOSIS — M1990 Unspecified osteoarthritis, unspecified site: Secondary | ICD-10-CM | POA: Diagnosis present

## 2024-09-28 DIAGNOSIS — M80052A Age-related osteoporosis with current pathological fracture, left femur, initial encounter for fracture: Principal | ICD-10-CM | POA: Diagnosis present

## 2024-09-28 DIAGNOSIS — E43 Unspecified severe protein-calorie malnutrition: Secondary | ICD-10-CM | POA: Diagnosis present

## 2024-09-28 DIAGNOSIS — I10 Essential (primary) hypertension: Secondary | ICD-10-CM | POA: Diagnosis not present

## 2024-09-28 DIAGNOSIS — Z7401 Bed confinement status: Secondary | ICD-10-CM | POA: Diagnosis not present

## 2024-09-28 DIAGNOSIS — R109 Unspecified abdominal pain: Secondary | ICD-10-CM | POA: Diagnosis not present

## 2024-09-28 LAB — CBC WITH DIFFERENTIAL/PLATELET
Abs Immature Granulocytes: 0.06 K/uL (ref 0.00–0.07)
Basophils Absolute: 0 K/uL (ref 0.0–0.1)
Basophils Relative: 0 %
Eosinophils Absolute: 0 K/uL (ref 0.0–0.5)
Eosinophils Relative: 0 %
HCT: 37.7 % (ref 36.0–46.0)
Hemoglobin: 11.3 g/dL — ABNORMAL LOW (ref 12.0–15.0)
Immature Granulocytes: 1 %
Lymphocytes Relative: 6 %
Lymphs Abs: 0.7 K/uL (ref 0.7–4.0)
MCH: 30 pg (ref 26.0–34.0)
MCHC: 30 g/dL (ref 30.0–36.0)
MCV: 100 fL (ref 80.0–100.0)
Monocytes Absolute: 1.2 K/uL — ABNORMAL HIGH (ref 0.1–1.0)
Monocytes Relative: 11 %
Neutro Abs: 8.4 K/uL — ABNORMAL HIGH (ref 1.7–7.7)
Neutrophils Relative %: 82 %
Platelets: 567 K/uL — ABNORMAL HIGH (ref 150–400)
RBC: 3.77 MIL/uL — ABNORMAL LOW (ref 3.87–5.11)
RDW: 16.2 % — ABNORMAL HIGH (ref 11.5–15.5)
WBC: 10.3 K/uL (ref 4.0–10.5)
nRBC: 0 % (ref 0.0–0.2)

## 2024-09-28 LAB — BASIC METABOLIC PANEL WITH GFR
Anion gap: 13 (ref 5–15)
BUN: 20 mg/dL (ref 8–23)
CO2: 25 mmol/L (ref 22–32)
Calcium: 9.1 mg/dL (ref 8.9–10.3)
Chloride: 107 mmol/L (ref 98–111)
Creatinine, Ser: 0.45 mg/dL (ref 0.44–1.00)
GFR, Estimated: >60 mL/min (ref >60)
Glucose, Bld: 267 mg/dL — ABNORMAL HIGH (ref 70–99)
Potassium: 3.9 mmol/L (ref 3.5–5.1)
Sodium: 145 mmol/L (ref 135–145)

## 2024-09-28 LAB — TYPE AND SCREEN
ABO/RH(D): A NEG
Antibody Screen: NEGATIVE

## 2024-09-28 LAB — SURGICAL PCR SCREEN
MRSA, PCR: NEGATIVE
Staphylococcus aureus: NEGATIVE

## 2024-09-28 LAB — GLUCOSE, CAPILLARY
Glucose-Capillary: 138 mg/dL — ABNORMAL HIGH (ref 70–99)
Glucose-Capillary: 235 mg/dL — ABNORMAL HIGH (ref 70–99)

## 2024-09-28 LAB — PROTIME-INR
INR: 1.1 (ref 0.8–1.2)
Prothrombin Time: 14.3 s (ref 11.4–15.2)

## 2024-09-28 MED ORDER — DIVALPROEX SODIUM 125 MG PO DR TAB
125.0000 mg | DELAYED_RELEASE_TABLET | Freq: Two times a day (BID) | ORAL | Status: DC
  Administered 2024-09-28 – 2024-10-13 (×29): 125 mg via ORAL
  Filled 2024-09-28 (×31): qty 1

## 2024-09-28 MED ORDER — INSULIN ASPART 100 UNIT/ML IJ SOLN
0.0000 [IU] | Freq: Three times a day (TID) | INTRAMUSCULAR | Status: DC
  Administered 2024-09-28 – 2024-09-29 (×2): 1 [IU] via SUBCUTANEOUS
  Administered 2024-09-29 – 2024-09-30 (×3): 2 [IU] via SUBCUTANEOUS
  Administered 2024-09-30 – 2024-10-01 (×2): 3 [IU] via SUBCUTANEOUS
  Administered 2024-10-01: 7 [IU] via SUBCUTANEOUS
  Administered 2024-10-01: 2 [IU] via SUBCUTANEOUS
  Administered 2024-10-02: 1 [IU] via SUBCUTANEOUS
  Administered 2024-10-02: 2 [IU] via SUBCUTANEOUS
  Administered 2024-10-02: 3 [IU] via SUBCUTANEOUS
  Administered 2024-10-03: 1 [IU] via SUBCUTANEOUS
  Administered 2024-10-03: 2 [IU] via SUBCUTANEOUS
  Administered 2024-10-03: 1 [IU] via SUBCUTANEOUS
  Administered 2024-10-04: 5 [IU] via SUBCUTANEOUS
  Administered 2024-10-04 (×2): 2 [IU] via SUBCUTANEOUS
  Administered 2024-10-05: 3 [IU] via SUBCUTANEOUS
  Administered 2024-10-05 – 2024-10-06 (×2): 1 [IU] via SUBCUTANEOUS
  Administered 2024-10-07: 3 [IU] via SUBCUTANEOUS
  Administered 2024-10-08: 2 [IU] via SUBCUTANEOUS
  Administered 2024-10-08 – 2024-10-09 (×2): 3 [IU] via SUBCUTANEOUS
  Administered 2024-10-09: 2 [IU] via SUBCUTANEOUS
  Administered 2024-10-09: 5 [IU] via SUBCUTANEOUS
  Administered 2024-10-10 (×2): 3 [IU] via SUBCUTANEOUS
  Administered 2024-10-10: 2 [IU] via SUBCUTANEOUS
  Administered 2024-10-11: 1 [IU] via SUBCUTANEOUS
  Administered 2024-10-11: 2 [IU] via SUBCUTANEOUS
  Administered 2024-10-11: 1 [IU] via SUBCUTANEOUS
  Administered 2024-10-12 (×2): 3 [IU] via SUBCUTANEOUS
  Administered 2024-10-12 – 2024-10-13 (×2): 1 [IU] via SUBCUTANEOUS
  Filled 2024-09-28: qty 2
  Filled 2024-09-28: qty 1
  Filled 2024-09-28: qty 2
  Filled 2024-09-28 (×2): qty 3
  Filled 2024-09-28 (×2): qty 1
  Filled 2024-09-28 (×2): qty 2
  Filled 2024-09-28: qty 3
  Filled 2024-09-28: qty 5
  Filled 2024-09-28: qty 1
  Filled 2024-09-28: qty 2
  Filled 2024-09-28: qty 0.09
  Filled 2024-09-28: qty 2
  Filled 2024-09-28: qty 3
  Filled 2024-09-28: qty 1
  Filled 2024-09-28: qty 5
  Filled 2024-09-28 (×2): qty 1
  Filled 2024-09-28 (×3): qty 3
  Filled 2024-09-28: qty 2
  Filled 2024-09-28: qty 1
  Filled 2024-09-28: qty 3

## 2024-09-28 MED ORDER — METFORMIN HCL ER 500 MG PO TB24: 500.0000 mg | ORAL_TABLET | Freq: Every evening | ORAL | Status: DC

## 2024-09-28 MED ORDER — MIRTAZAPINE 15 MG PO TABS
15.0000 mg | ORAL_TABLET | Freq: Every day | ORAL | Status: DC
  Administered 2024-09-28 – 2024-10-06 (×9): 15 mg via ORAL
  Filled 2024-09-28 (×9): qty 1

## 2024-09-28 MED ORDER — ACETAMINOPHEN 325 MG PO TABS
650.0000 mg | ORAL_TABLET | Freq: Four times a day (QID) | ORAL | Status: DC | PRN
  Administered 2024-09-28 – 2024-10-13 (×6): 650 mg via ORAL
  Filled 2024-09-28 (×6): qty 2

## 2024-09-28 MED ORDER — BISACODYL 5 MG PO TBEC
5.0000 mg | DELAYED_RELEASE_TABLET | Freq: Every day | ORAL | Status: DC
  Administered 2024-09-30 – 2024-10-13 (×8): 5 mg via ORAL
  Filled 2024-09-28 (×10): qty 1

## 2024-09-28 MED ORDER — IRBESARTAN 75 MG PO TABS
75.0000 mg | ORAL_TABLET | Freq: Every day | ORAL | Status: DC
  Administered 2024-09-30 – 2024-10-13 (×14): 75 mg via ORAL
  Filled 2024-09-28 (×14): qty 1

## 2024-09-28 MED ORDER — LIDOCAINE 5 % EX PTCH
1.0000 | MEDICATED_PATCH | CUTANEOUS | Status: DC
  Administered 2024-09-30 – 2024-10-13 (×13): 1 via TRANSDERMAL
  Filled 2024-09-28 (×15): qty 1

## 2024-09-28 MED ORDER — MEMANTINE HCL 10 MG PO TABS
10.0000 mg | ORAL_TABLET | Freq: Two times a day (BID) | ORAL | Status: DC
  Administered 2024-09-28 – 2024-10-13 (×29): 10 mg via ORAL
  Filled 2024-09-28 (×5): qty 1
  Filled 2024-09-28: qty 2
  Filled 2024-09-28 (×10): qty 1
  Filled 2024-09-28: qty 2
  Filled 2024-09-28 (×13): qty 1

## 2024-09-28 MED ORDER — ONDANSETRON HCL 4 MG PO TABS: 4.0000 mg | ORAL_TABLET | Freq: Four times a day (QID) | ORAL | Status: DC | PRN

## 2024-09-28 MED ORDER — TRAMADOL HCL 50 MG PO TABS: 50.0000 mg | ORAL_TABLET | Freq: Three times a day (TID) | ORAL | Status: DC

## 2024-09-28 MED ORDER — LIDOCAINE 4 % EX PTCH: 1.0000 | MEDICATED_PATCH | CUTANEOUS | Status: DC

## 2024-09-28 MED ORDER — GLIMEPIRIDE 4 MG PO TABS: 4.0000 mg | ORAL_TABLET | Freq: Every day | ORAL | Status: DC

## 2024-09-28 MED ORDER — ACETAMINOPHEN 650 MG RE SUPP: 650.0000 mg | Freq: Four times a day (QID) | RECTAL | Status: DC | PRN

## 2024-09-28 MED ORDER — ONDANSETRON HCL 4 MG/2ML IJ SOLN: 4.0000 mg | Freq: Four times a day (QID) | INTRAMUSCULAR | Status: DC | PRN

## 2024-09-28 MED ORDER — ASPIRIN 81 MG PO TBEC
81.0000 mg | DELAYED_RELEASE_TABLET | Freq: Every evening | ORAL | Status: DC
  Administered 2024-09-29 – 2024-10-12 (×14): 81 mg via ORAL
  Filled 2024-09-28 (×14): qty 1

## 2024-09-28 MED ORDER — KETOROLAC TROMETHAMINE 15 MG/ML IJ SOLN
15.0000 mg | Freq: Once | INTRAMUSCULAR | Status: AC
  Administered 2024-09-28: 15 mg via INTRAVENOUS
  Filled 2024-09-28: qty 1

## 2024-09-28 MED ORDER — ATORVASTATIN CALCIUM 40 MG PO TABS
40.0000 mg | ORAL_TABLET | Freq: Every day | ORAL | Status: DC
  Administered 2024-09-30 – 2024-10-13 (×14): 40 mg via ORAL
  Filled 2024-09-28 (×14): qty 1

## 2024-09-28 MED ORDER — MORPHINE SULFATE (PF) 2 MG/ML IV SOLN: 0.5000 mg | INTRAVENOUS | Status: DC | PRN

## 2024-09-28 MED ORDER — INSULIN GLARGINE-YFGN 100 UNIT/ML ~~LOC~~ SOLN
10.0000 [IU] | Freq: Every day | SUBCUTANEOUS | Status: DC
Start: 2024-09-29 — End: 2024-10-04
  Administered 2024-09-30 – 2024-10-04 (×5): 10 [IU] via SUBCUTANEOUS
  Filled 2024-09-28 (×6): qty 0.1

## 2024-09-28 MED ORDER — FERROUS SULFATE 325 (65 FE) MG PO TBEC: 325.0000 mg | DELAYED_RELEASE_TABLET | Freq: Two times a day (BID) | ORAL | Status: DC

## 2024-09-28 MED ORDER — HYDROCODONE-ACETAMINOPHEN 5-325 MG PO TABS
1.0000 | ORAL_TABLET | Freq: Four times a day (QID) | ORAL | Status: DC | PRN
  Administered 2024-09-28 – 2024-10-12 (×25): 1 via ORAL
  Filled 2024-09-28 (×26): qty 1

## 2024-09-28 MED ORDER — DONEPEZIL HCL 23 MG PO TABS: 23.0000 mg | ORAL_TABLET | Freq: Every day | ORAL | Status: DC

## 2024-09-28 NOTE — ED Provider Notes (Signed)
 [place] EMERGENCY DEPARTMENT AT [facility] Provider Note   CSN: [id number] Arrival date & time: 09/28/24  1100     Patient presents with: Doris Wyatt   Doris Wyatt is a 78 y.o. female.   HPI Pt BIB ems for an unwitnessed fall 2 days ago. Pt complains of left hip pain. Hx of dementia and DM. VS Stable     Prior to Admission medications   Medication Sig Start Date End Date Taking? Authorizing Provider  acetaminophen  (TYLENOL ) 325 MG tablet Take 2 tablets (650 mg total) by mouth every 6 (six) hours as needed for mild pain (or Fever >/= 101). Patient not taking: Reported on 07/21/2021 11/04/17   Dolan Mateo Larger, MD  albuterol  (VENTOLIN  HFA) 108 [phone] Base) MCG/ACT inhaler Inhale 2 puffs into the lungs every 6 (six) hours as needed for wheezing or shortness of breath.    [provider]  aspirin EC 81 MG tablet Take 81 mg by mouth every evening. Swallow whole.    [provider]  atorvastatin  (LIPITOR) 40 MG tablet Take 40 mg by mouth daily.    [provider]  divalproex  (DEPAKOTE ) 125 MG DR tablet Take 1 tablet (125 mg total) by mouth 2 (two) times daily. 06/14/24   Wertman, Sara E, PA-C  donepezil  (ARICEPT ) 23 MG TABS tablet TAKE 1 TABLET BY MOUTH EVERY DAY AT NIGHT 08/05/23   Wertman, Sara E, PA-C  enoxaparin (LOVENOX) 30 MG/0.3ML injection Inject 0.3 mLs (30 mg total) into the skin daily. 09/13/24 10/13/24  Odell Celinda Balo, MD  ferrous sulfate 325 (65 FE) MG EC tablet Take 1 tablet (325 mg total) by mouth 2 (two) times daily. 09/12/24 09/12/25  Odell Celinda Balo, MD  glimepiride (AMARYL) 4 MG tablet Take 4 mg by mouth daily with breakfast.    [provider]  HYDROcodone -acetaminophen  (NORCO/VICODIN) 5-325 MG tablet Take 1 tablet by mouth every 6 (six) hours as needed for moderate pain (pain score 4-6) (moderate pain). 09/12/24   Odell Celinda Balo, MD  memantine  (NAMENDA ) 10 MG tablet TAKE 1 TABLET BY MOUTH TWICE A DAY  06/16/24   Dina, Sara E, PA-C  metFORMIN (GLUCOPHAGE-XR) 500 MG 24 hr tablet Take 500 mg by mouth every evening. 05/30/21   [provider]  valsartan  (DIOVAN ) 80 MG tablet Take 80 mg by mouth daily.    [provider]    Allergies: Morphine  sulfate, Oxycodone -acetaminophen , and Sertraline hcl    Review of Systems  Updated Vital Signs BP (!) 152/75 (BP Location: Right Arm)   Pulse 72   Temp 98.1 F (36.7 C) (Oral)   Resp 16   SpO2 94%   Physical Exam Vitals and nursing note reviewed.  Constitutional:      General: She is not in acute distress.    Appearance: She is well-developed.  HENT:     Head: Normocephalic and atraumatic.  Eyes:     Conjunctiva/sclera: Conjunctivae normal.  Cardiovascular:     Rate and Rhythm: Normal rate and regular rhythm.     Pulses: Normal pulses.  Pulmonary:     Effort: Pulmonary effort is normal. No respiratory distress.     Breath sounds: Normal breath sounds. No stridor.  Abdominal:     General: There is no distension.  Musculoskeletal:       Arms:  Skin:    General: Skin is warm and dry.  Neurological:     Mental Status: She is alert.     Cranial Nerves: No  cranial nerve deficit.     Motor: Atrophy present.  Psychiatric:        Cognition and Memory: Cognition is impaired. Memory is impaired.     (all labs ordered are listed, but only abnormal results are displayed) Labs Reviewed  BASIC METABOLIC PANEL WITH GFR - Abnormal; Notable for the following components:      Result Value   Glucose, Bld 267 (*)    All other components within normal limits  CBC WITH DIFFERENTIAL/PLATELET - Abnormal; Notable for the following components:   RBC 3.77 (*)    Hemoglobin 11.3 (*)    RDW 16.2 (*)    Platelets 567 (*)    Neutro Abs 8.4 (*)    Monocytes Absolute 1.2 (*)    All other components within normal limits  PROTIME-INR  TYPE AND SCREEN    EKG: None  Radiology: DG Femur Min 2 Views Left Result Date:  09/28/2024 EXAM: 2 VIEW(S) XRAY OF THE LEFT FEMUR 09/28/2024 12:17:00 PM COMPARISON: Left femur series dated 09/08/2024 and 09/07/2024. CLINICAL HISTORY: fall fall FINDINGS: BONES AND JOINTS: Spiral mildly displaced fracture of the mid femoral shaft. Status post open reduction and internal fixation of a recent intertrochanteric fracture with an orthopedic screw within the femoral head and neck and anchoring rod extending the length of the femoral shaft. No joint dislocation. SOFT TISSUES: There are vascular calcifications present. IMPRESSION: 1. Spiral mildly displaced fracture of the mid femoral shaft, developed since prior studies. 2. Status post open reduction and internal fixation of a recent intertrochanteric fracture with an orthopedic screw within the femoral head and neck and anchoring rod extending the length of the femoral shaft. Electronically signed by: Evalene Coho MD 09/28/2024 12:38 PM EDT RP Workstation: [id number]     Procedures   Medications Ordered in the ED - No data to display                                  Medical Decision Making Elderly female with recent left leg hip surgical repair now presents after a fall 2 days ago with pain reportedly in the leg.  Patient's dementia is limiting, but with recent fall, concern for displaced hardware, patient had x-ray labs evaluation. Cardiac 75 sinus normal pulse ox 95% room air borderline  Amount and/or Complexity of Data Reviewed Independent Historian: EMS External Data Reviewed: notes.    Details: Fall notes from earlier this month reviewed Labs: ordered. Decision-making details documented in ED Course. Radiology: ordered and independent interpretation performed. Decision-making details documented in ED Course. ECG/medicine tests: ordered and independent interpretation performed. Decision-making details documented in ED Course.  Risk Prescription drug management. Decision regarding hospitalization. Diagnosis or  treatment significantly limited by social determinants of health.   Patient found to have a spiral fracture of femoral shaft, new since most recent study.  I have consulted our orthopedic colleagues, her surgical team as the surgery was only 20 days ago.  Labs reassuring, vitals reassuring. This elderly female presenting after a fall, possibly 2 days ago, now with pain in left leg unwillingness to move it in the context of recent orthopedic surgery was found to have new fracture periprosthetic requiring admission, surgical consult.   Final diagnoses:  Fall, initial encounter  Periprosthetic fracture around internal prosthetic left hip joint, initial encounter [facility])    ED Discharge Orders     None          Garrick,  Lamar, MD 09/28/24 1252

## 2024-09-28 NOTE — Consult Note (Signed)
 Orthopedic Consult  Patient ID: Doris Wyatt MRN: [id number] DOB/AGE: 80/12/1945 77 y.o.  Reason for Consult: Left femur fracture Referring Physician: celinda HPI: [name] is an 78 y.o. female T with a history of dementia.  She had fallen a few weeks ago and sustained a left trochanteric hip fracture.  This was affixed with a intramedullary nail.  This is a long nail that was not locked distally.  She is doing quite well making progress at rehab but sustained a fall.  She has had increasing leg pain since then.  She was seen in the emergency room where x-rays were obtained.  A new fracture of the left femur was noted on her consult for management  Past Medical History:  Diagnosis Date   Abdominal pain, acute, left lower quadrant 10/31/2017   Arthritis    COPD (chronic obstructive pulmonary disease)    Diverticulitis of large intestine with perforation without abscess or bleeding    Generalized anxiety disorder    History of kidney stones    Hypercholesterolemia    Hypertension    Major depressive disorder    Major neurocognitive disorder due to Alzheimer's disease 07/01/2021   Osteoporosis    Pneumonia    hx of    Type II diabetes mellitus    Ureteral stone with hydronephrosis 10/31/2017   Urolithiasis     Past Surgical History:  Procedure Laterality Date   APPENDECTOMY     BACK SURGERY     x 2   CYSTOSCOPY W/ URETERAL STENT PLACEMENT Left 10/31/2017   Procedure: CYSTOSCOPY WITH RETROGRADE PYELOGRAM/LEFT URETERAL STENT PLACEMENT;  Surgeon: Watt Rush, MD;  Location: [facility];  Service: Urology;  Laterality: Left;   CYSTOSCOPY/URETEROSCOPY/HOLMIUM LASER/STENT PLACEMENT Left 11/09/2017   Procedure: CYSTOSCOPY LEFT URETEROSCOPY WITH HOLMIUM LASER STENT EXCHANGE, STONE BASKET RETRIVAL, STENT PLACEMENT;  Surgeon: Watt Rush, MD;  Location: [facility];  Service: Urology;  Laterality: Left;   FEMUR IM NAIL Left 09/08/2024   Procedure: INSERTION,  INTRAMEDULLARY ROD, FEMUR;  Surgeon: Reyne Cordella SQUIBB, MD;  Location: [facility];  Service: Orthopedics;  Laterality: Left;   TUBAL LIGATION      Family History  Problem Relation Age of Onset   Memory loss Mother     Social History:  reports that she quit smoking about 21 years ago. Her smoking use included cigarettes. She started smoking about 61 years ago. She has a 120 pack-year smoking history. She has never used smokeless tobacco. She reports current alcohol use of about 7.0 standard drinks of alcohol per week. She reports that she does not use drugs.  Allergies:  Allergies  Allergen Reactions   Morphine  Sulfate Other (See Comments)    Reaction with multiple opioids   Oxycodone -Acetaminophen  Other (See Comments)    Other Reaction(s): Dizziness  Reaction with multiple opioids   Sertraline Hcl Other (See Comments)    Other Reaction(s): Dizziness  Felt funny    Medications: I have reviewed the patient's current medications.  ROS: According to the patient's family review of systems negative except as mentioned above   Exam: Blood pressure (!) 142/78, pulse 67, temperature 98.1 F (36.7 C), temperature source Oral, resp. rate 17, SpO2 96%. General: Well-appearing woman resting in bed Orientation: Alert but does have baseline dementia Mood and Affect: Mood is calm   Injured Extremity (CV, lymph, sensation, reflexes): Healed surgical incisions on the left hip tender to palpation along the left thigh.  Legs are normal in length.  No tense of  the ankle or foot.  Grossly neurologically intact in the feet.  No obvious tendon patient crepitus deformities of the bowel upper extremity.  Examination of her mouth reveals that one of her front teeth is rotated proximately 90 degrees    Medical Decision Making: Data: Imaging: X-rays reveal an intramedullary nail affixing a proximal trochanteric fracture.  There is a new midshaft femur fracture.  Labs: White cell count 10.3, hemoglobin  11.3, hematocrit 37.7   Medical history and chart was reviewed and case discussed with medical provider.  Assessment/Plan: The patient does have a new left midshaft femur fracture.  This is relatively fixed by the intramedullary nail and is in except alignment around the fracture.  That being said, still does require surgery to stabilize.  This will require placement of the distal interlocking screws into the already placed intramedullary nail.  We discussed the risks of surgically bleeding infection injury to nerves and the risk of anesthesia.  I discussed this with the patient's family.  Once the surgery is complete, she can be weightbearing as tolerated.  She would likely need a rehab placement again but would like to go to a different facility.  Will plan for surgery be done tomorrow.  The patient does have a sickly rotated tooth.  Not aware we have dental services available in the hospital will try to find arrangements to be made for this.  New problem w/ workup planned: High complexity diagnosis (Level 5) Surgery w/ risks or Emergency surgery: High complexity Risk (Level 5)  All others are Level 4 with comprehensive musculoskeletal exam.  Cordella Rhein, MD, MS [facility] [phone]

## 2024-09-28 NOTE — Plan of Care (Signed)
   Problem: Education: Goal: Ability to describe self-care measures that may prevent or decrease complications (Diabetes Survival Skills Education) will improve Outcome: Not Progressing Goal: Individualized Educational Video(s) Outcome: Not Progressing

## 2024-09-28 NOTE — ED Triage Notes (Signed)
 Pt BIB ems for an unwitnessed fall 2 days ago. Pt complains of left hip pain. Hx of dementia and DM. VS Stable

## 2024-09-28 NOTE — H&P (Addendum)
 History and Physical    Patient: Doris Wyatt FMW:[id number] DOB: [birth year] DOA: 09/28/2024 DOS: the patient was seen and examined on 09/28/2024 PCP: [name], MD  Patient coming from: SNF  Chief Complaint:  Chief Complaint  Patient presents with   Fall   HPI: Doris Wyatt is a 78 y.o. female with medical history significant of external pain,*Tritus, COPD, diverticulosis,/diverticulitis with perforation, nephrolithiasis, anxiety, depression, hyperlipidemia, hypertension, Alzheimer's disease, osteoporosis, history of pneumonia, type 2 diabetes, ureteral stone with hydronephrosis who underwent a left femur intramedullary nail insertion 3 weeks ago, discharged to SNF, but sent to the emergency department after having an unwitnessed fall 2 days ago and complaining of pain in the left hip area.  Left femur x-ray showing mildly displaced spiral fracture of the mid femoral shaft. He denied fever, chills, rhinorrhea, sore throat, wheezing or hemoptysis.  No chest pain, palpitations, diaphoresis, PND, orthopnea or pitting edema of the lower extremities.  No abdominal pain, nausea, emesis, diarrhea, melena or hematochezia.  No flank pain, dysuria, frequency or hematuria.  No polyuria, polydipsia, polyphagia or blurred vision.   Lab work: CBC showed a white count of 10.3, hemoglobin 11.3 g/dL platelets 432.  Normal PT and INR.  BMP was normal with the exception of a glucose of 267 mg/dL.  Imaging: Left femur x-ray showing a spiral mildly displaced fracture of the mid femoral shaft, developed since prior studies.  Status post open reduction and internal fixation of recent intertrochanteric fracture with orthopedic screws.   ED course: Initial vital signs were temperature 98.1 F, pulse 72, respirations 16, BP 152/75 mmHg and O2 sat 92% on room air.  Review of Systems: As mentioned in the history of present illness. All other systems reviewed and are negative. Past Medical History:   Diagnosis Date   Abdominal pain, acute, left lower quadrant 10/31/2017   Arthritis    COPD (chronic obstructive pulmonary disease)    Diverticulitis of large intestine with perforation without abscess or bleeding    Generalized anxiety disorder    History of kidney stones    Hypercholesterolemia    Hypertension    Major depressive disorder    Major neurocognitive disorder due to Alzheimer's disease 07/01/2021   Osteoporosis    Pneumonia    hx of    Type II diabetes mellitus    Ureteral stone with hydronephrosis 10/31/2017   Urolithiasis    Past Surgical History:  Procedure Laterality Date   APPENDECTOMY     BACK SURGERY     x 2   CYSTOSCOPY W/ URETERAL STENT PLACEMENT Left 10/31/2017   Procedure: CYSTOSCOPY WITH RETROGRADE PYELOGRAM/LEFT URETERAL STENT PLACEMENT;  Surgeon: Watt Rush, MD;  Location: [facility];  Service: Urology;  Laterality: Left;   CYSTOSCOPY/URETEROSCOPY/HOLMIUM LASER/STENT PLACEMENT Left 11/09/2017   Procedure: CYSTOSCOPY LEFT URETEROSCOPY WITH HOLMIUM LASER STENT EXCHANGE, STONE BASKET RETRIVAL, STENT PLACEMENT;  Surgeon: Watt Rush, MD;  Location: [facility];  Service: Urology;  Laterality: Left;   FEMUR IM NAIL Left 09/08/2024   Procedure: INSERTION, INTRAMEDULLARY ROD, FEMUR;  Surgeon: Reyne Cordella SQUIBB, MD;  Location: [facility];  Service: Orthopedics;  Laterality: Left;   TUBAL LIGATION     Social History:  reports that she quit smoking about 21 years ago. Her smoking use included cigarettes. She started smoking about 61 years ago. She has a 120 pack-year smoking history. She has never used smokeless tobacco. She reports current alcohol use of about 7.0 standard drinks of alcohol per week. She reports  that she does not use drugs.  Allergies  Allergen Reactions   Morphine  Sulfate Other (See Comments)   Oxycodone -Acetaminophen  Other (See Comments)   Sertraline Hcl Other (See Comments)    Family History  Problem Relation Age of Onset    Memory loss Mother     Prior to Admission medications   Medication Sig Start Date End Date Taking? Authorizing Provider  acetaminophen  (TYLENOL ) 325 MG tablet Take 2 tablets (650 mg total) by mouth every 6 (six) hours as needed for mild pain (or Fever >/= 101). Patient not taking: Reported on 07/21/2021 11/04/17   Dolan Mateo Larger, MD  albuterol  (VENTOLIN  HFA) 108 [phone] Base) MCG/ACT inhaler Inhale 2 puffs into the lungs every 6 (six) hours as needed for wheezing or shortness of breath.    [provider]  aspirin EC 81 MG tablet Take 81 mg by mouth every evening. Swallow whole.    [provider]  atorvastatin  (LIPITOR) 40 MG tablet Take 40 mg by mouth daily.    [provider]  divalproex  (DEPAKOTE ) 125 MG DR tablet Take 1 tablet (125 mg total) by mouth 2 (two) times daily. 06/14/24   Wertman, Sara E, PA-C  donepezil  (ARICEPT ) 23 MG TABS tablet TAKE 1 TABLET BY MOUTH EVERY DAY AT NIGHT 08/05/23   Wertman, Sara E, PA-C  enoxaparin (LOVENOX) 30 MG/0.3ML injection Inject 0.3 mLs (30 mg total) into the skin daily. 09/13/24 10/13/24  Odell Celinda Balo, MD  ferrous sulfate 325 (65 FE) MG EC tablet Take 1 tablet (325 mg total) by mouth 2 (two) times daily. 09/12/24 09/12/25  Odell Celinda Balo, MD  glimepiride (AMARYL) 4 MG tablet Take 4 mg by mouth daily with breakfast.    [provider]  HYDROcodone -acetaminophen  (NORCO/VICODIN) 5-325 MG tablet Take 1 tablet by mouth every 6 (six) hours as needed for moderate pain (pain score 4-6) (moderate pain). 09/12/24   Odell Celinda Balo, MD  memantine  (NAMENDA ) 10 MG tablet TAKE 1 TABLET BY MOUTH TWICE A DAY 06/16/24   Dina, Sara E, PA-C  metFORMIN (GLUCOPHAGE-XR) 500 MG 24 hr tablet Take 500 mg by mouth every evening. 05/30/21   [provider]  valsartan  (DIOVAN ) 80 MG tablet Take 80 mg by mouth daily.    [provider]    Physical Exam: Vitals:   09/28/24 1106 09/28/24 1109 09/28/24 1112  BP:  (!) 152/75    Pulse: 72    Resp: 16    Temp: 98.1 F (36.7 C)    TempSrc: Oral    SpO2: 92% 94% 94%   Physical Exam Vitals reviewed.  Constitutional:      General: She is awake. She is not in acute distress.    Appearance: She is ill-appearing.  HENT:     Head: Normocephalic.     Nose: No rhinorrhea.     Mouth/Throat:     Mouth: Mucous membranes are moist.  Eyes:     General: No scleral icterus.    Pupils: Pupils are equal, round, and reactive to light.  Neck:     Vascular: No JVD.  Cardiovascular:     Rate and Rhythm: Normal rate and regular rhythm.     Heart sounds: S1 normal and S2 normal.  Pulmonary:     Effort: Pulmonary effort is normal.     Breath sounds: No wheezing, rhonchi or rales.  Abdominal:     General: Abdomen is flat. Bowel sounds are normal. There is no distension.     Palpations:  Abdomen is soft.     Tenderness: There is no abdominal tenderness.  Musculoskeletal:     Cervical back: Neck supple.     Right lower leg: No edema.     Left lower leg: No edema.  Skin:    General: Skin is warm and dry.  Neurological:     General: No focal deficit present.     Mental Status: She is alert. Mental status is at baseline.  Psychiatric:        Mood and Affect: Mood normal.        Behavior: Behavior normal. Behavior is cooperative.     Data Reviewed:  Results are pending, will review when available.  Assessment and Plan: Principal Problem:   Left femoral shaft fracture (HCC) Admit to telemetry/inpatient. Ice area as needed. Buck's traction per protocol. Analgesics as needed. Antiemetics as needed. Consult TOC team. Consult nutritional services. PT/OT evaluation after surgery. Orthopedic surgery evaluation appreciated.  Active Problems:   Hypertension associated with diabetes (HCC) Continue Avapro  75 mg p.o. daily.    COPD (chronic obstructive pulmonary disease) Bronchodilators as needed.    Hyperlipidemia associated with type 2 diabetes  mellitus (HCC) Continue atorvastatin  40 mg p.o. daily.    Type 2 diabetes mellitus (HCC) Carbohydrate modified diet. Continue Lantus 10 units daily. CBG monitoring with RI SS. Check hemoglobin A1c.    Generalized anxiety disorder Continue mirtazapine 15 mg p.o. bedtime.    Osteoporosis Would benefit from calcium , vitamin D and folate supplementation. Follow-up with primary care provider for biphosphonate or other form of treatment.    Major neurocognitive disorder due to Alzheimer's disease Continue memantine  10 mg p.o. twice daily. Continue divalproex  125 mg p.o. twice daily.    Advance Care Planning:   Code Status: Full Code   Consults: Orthopedic surgery Vina, Cordella SQUIBB, MD).  Family Communication:   Severity of Illness: The appropriate patient status for this patient is INPATIENT. Inpatient status is judged to be reasonable and necessary in order to provide the required intensity of service to ensure the patient's safety. The patient's presenting symptoms, physical exam findings, and initial radiographic and laboratory data in the context of their chronic comorbidities is felt to place them at high risk for further clinical deterioration. Furthermore, it is not anticipated that the patient will be medically stable for discharge from the hospital within 2 midnights of admission.   * I certify that at the point of admission it is my clinical judgment that the patient will require inpatient hospital care spanning beyond 2 midnights from the point of admission due to high intensity of service, high risk for further deterioration and high frequency of surveillance required.*  Author: Alm Dorn Castor, MD 09/28/2024 2:36 PM  For on call review [email or web address].   This document was prepared using Dragon voice recognition software and may contain some unintended transcription errors.

## 2024-09-28 NOTE — H&P (View-Only) (Signed)
 Orthopedic Consult  Patient ID: Doris ALONGE MRN: [id number] DOB/AGE: 80/12/1945 77 y.o.  Reason for Consult: Left femur fracture Referring Physician: celinda HPI: Doris Wyatt is an 78 y.o. female T with a history of dementia.  She had fallen a few weeks ago and sustained a left trochanteric hip fracture.  This was affixed with a intramedullary nail.  This is a long nail that was not locked distally.  She is doing quite well making progress at rehab but sustained a fall.  She has had increasing leg pain since then.  She was seen in the emergency room where x-rays were obtained.  A new fracture of the left femur was noted on her consult for management  Past Medical History:  Diagnosis Date   Abdominal pain, acute, left lower quadrant 10/31/2017   Arthritis    COPD (chronic obstructive pulmonary disease)    Diverticulitis of large intestine with perforation without abscess or bleeding    Generalized anxiety disorder    History of kidney stones    Hypercholesterolemia    Hypertension    Major depressive disorder    Major neurocognitive disorder due to Alzheimer's disease 07/01/2021   Osteoporosis    Pneumonia    hx of    Type II diabetes mellitus    Ureteral stone with hydronephrosis 10/31/2017   Urolithiasis     Past Surgical History:  Procedure Laterality Date   APPENDECTOMY     BACK SURGERY     x 2   CYSTOSCOPY W/ URETERAL STENT PLACEMENT Left 10/31/2017   Procedure: CYSTOSCOPY WITH RETROGRADE PYELOGRAM/LEFT URETERAL STENT PLACEMENT;  Surgeon: Watt Rush, MD;  Location: [facility];  Service: Urology;  Laterality: Left;   CYSTOSCOPY/URETEROSCOPY/HOLMIUM LASER/STENT PLACEMENT Left 11/09/2017   Procedure: CYSTOSCOPY LEFT URETEROSCOPY WITH HOLMIUM LASER STENT EXCHANGE, STONE BASKET RETRIVAL, STENT PLACEMENT;  Surgeon: Watt Rush, MD;  Location: [facility];  Service: Urology;  Laterality: Left;   FEMUR IM NAIL Left 09/08/2024   Procedure: INSERTION,  INTRAMEDULLARY ROD, FEMUR;  Surgeon: Reyne Cordella SQUIBB, MD;  Location: [facility];  Service: Orthopedics;  Laterality: Left;   TUBAL LIGATION      Family History  Problem Relation Age of Onset   Memory loss Mother     Social History:  reports that she quit smoking about 21 years ago. Her smoking use included cigarettes. She started smoking about 61 years ago. She has a 120 pack-year smoking history. She has never used smokeless tobacco. She reports current alcohol use of about 7.0 standard drinks of alcohol per week. She reports that she does not use drugs.  Allergies:  Allergies  Allergen Reactions   Morphine  Sulfate Other (See Comments)    Reaction with multiple opioids   Oxycodone -Acetaminophen  Other (See Comments)    Other Reaction(s): Dizziness  Reaction with multiple opioids   Sertraline Hcl Other (See Comments)    Other Reaction(s): Dizziness  Felt funny    Medications: I have reviewed the patient's current medications.  ROS: According to the patient's family review of systems negative except as mentioned above   Exam: Blood pressure (!) 142/78, pulse 67, temperature 98.1 F (36.7 C), temperature source Oral, resp. rate 17, SpO2 96%. General: Well-appearing woman resting in bed Orientation: Alert but does have baseline dementia Mood and Affect: Mood is calm   Injured Extremity (CV, lymph, sensation, reflexes): Healed surgical incisions on the left hip tender to palpation along the left thigh.  Legs are normal in length.  No tense of  the ankle or foot.  Grossly neurologically intact in the feet.  No obvious tendon patient crepitus deformities of the bowel upper extremity.  Examination of her mouth reveals that one of her front teeth is rotated proximately 90 degrees    Medical Decision Making: Data: Imaging: X-rays reveal an intramedullary nail affixing a proximal trochanteric fracture.  There is a new midshaft femur fracture.  Labs: White cell count 10.3, hemoglobin  11.3, hematocrit 37.7   Medical history and chart was reviewed and case discussed with medical provider.  Assessment/Plan: The patient does have a new left midshaft femur fracture.  This is relatively fixed by the intramedullary nail and is in except alignment around the fracture.  That being said, still does require surgery to stabilize.  This will require placement of the distal interlocking screws into the already placed intramedullary nail.  We discussed the risks of surgically bleeding infection injury to nerves and the risk of anesthesia.  I discussed this with the patient's family.  Once the surgery is complete, she can be weightbearing as tolerated.  She would likely need a rehab placement again but would like to go to a different facility.  Will plan for surgery be done tomorrow.  The patient does have a sickly rotated tooth.  Not aware we have dental services available in the hospital will try to find arrangements to be made for this.  New problem w/ workup planned: High complexity diagnosis (Level 5) Surgery w/ risks or Emergency surgery: High complexity Risk (Level 5)  All others are Level 4 with comprehensive musculoskeletal exam.  Cordella Rhein, MD, MS [facility] [phone]

## 2024-09-29 ENCOUNTER — Inpatient Hospital Stay (HOSPITAL_COMMUNITY): Admitting: Certified Registered Nurse Anesthetist

## 2024-09-29 ENCOUNTER — Inpatient Hospital Stay (HOSPITAL_COMMUNITY): Admission: RE | Admit: 2024-09-29 | Source: Home / Self Care

## 2024-09-29 ENCOUNTER — Inpatient Hospital Stay (HOSPITAL_COMMUNITY)

## 2024-09-29 ENCOUNTER — Encounter (HOSPITAL_COMMUNITY)
Admission: EM | Disposition: A | Discharge status: Skilled Nursing Facility | Payer: Self-pay | Source: Skilled Nursing Facility | Attending: Internal Medicine

## 2024-09-29 DIAGNOSIS — Z0189 Encounter for other specified special examinations: Secondary | ICD-10-CM | POA: Diagnosis not present

## 2024-09-29 DIAGNOSIS — E119 Type 2 diabetes mellitus without complications: Secondary | ICD-10-CM

## 2024-09-29 DIAGNOSIS — I10 Essential (primary) hypertension: Secondary | ICD-10-CM | POA: Diagnosis not present

## 2024-09-29 DIAGNOSIS — S72342A Displaced spiral fracture of shaft of left femur, initial encounter for closed fracture: Secondary | ICD-10-CM | POA: Diagnosis not present

## 2024-09-29 DIAGNOSIS — S7292XA Unspecified fracture of left femur, initial encounter for closed fracture: Secondary | ICD-10-CM | POA: Diagnosis not present

## 2024-09-29 DIAGNOSIS — Z794 Long term (current) use of insulin: Secondary | ICD-10-CM | POA: Diagnosis not present

## 2024-09-29 HISTORY — PX: ORIF FEMUR FRACTURE: SHX2119

## 2024-09-29 LAB — COMPREHENSIVE METABOLIC PANEL WITH GFR
ALT: 17 U/L (ref 0–44)
AST: 19 U/L (ref 15–41)
Albumin: 3.3 g/dL — ABNORMAL LOW (ref 3.5–5.0)
Alkaline Phosphatase: 149 U/L — ABNORMAL HIGH (ref 38–126)
Anion gap: 12 (ref 5–15)
BUN: 18 mg/dL (ref 8–23)
CO2: 27 mmol/L (ref 22–32)
Calcium: 8.6 mg/dL — ABNORMAL LOW (ref 8.9–10.3)
Chloride: 108 mmol/L (ref 98–111)
Creatinine, Ser: 0.38 mg/dL — ABNORMAL LOW (ref 0.44–1.00)
GFR, Estimated: >60 mL/min (ref >60)
Glucose, Bld: 139 mg/dL — ABNORMAL HIGH (ref 70–99)
Potassium: 3.7 mmol/L (ref 3.5–5.1)
Sodium: 146 mmol/L — ABNORMAL HIGH (ref 135–145)
Total Bilirubin: 0.7 mg/dL (ref 0.0–1.2)
Total Protein: 6.1 g/dL — ABNORMAL LOW (ref 6.5–8.1)

## 2024-09-29 LAB — CBC
HCT: 34.5 % — ABNORMAL LOW (ref 36.0–46.0)
HCT: 38.5 % (ref 36.0–46.0)
Hemoglobin: 10.5 g/dL — ABNORMAL LOW (ref 12.0–15.0)
Hemoglobin: 11.1 g/dL — ABNORMAL LOW (ref 12.0–15.0)
MCH: 30.8 pg (ref 26.0–34.0)
MCH: 30.2 pg (ref 26.0–34.0)
MCHC: 30.4 g/dL (ref 30.0–36.0)
MCHC: 28.8 g/dL — ABNORMAL LOW (ref 30.0–36.0)
MCV: 101.2 fL — ABNORMAL HIGH (ref 80.0–100.0)
MCV: 104.6 fL — ABNORMAL HIGH (ref 80.0–100.0)
Platelets: 454 K/uL — ABNORMAL HIGH (ref 150–400)
Platelets: 404 K/uL — ABNORMAL HIGH (ref 150–400)
RBC: 3.41 MIL/uL — ABNORMAL LOW (ref 3.87–5.11)
RBC: 3.68 MIL/uL — ABNORMAL LOW (ref 3.87–5.11)
RDW: 15.9 % — ABNORMAL HIGH (ref 11.5–15.5)
RDW: 15.9 % — ABNORMAL HIGH (ref 11.5–15.5)
WBC: 8 K/uL (ref 4.0–10.5)
WBC: 8.9 K/uL (ref 4.0–10.5)
nRBC: 0 % (ref 0.0–0.2)
nRBC: 0 % (ref 0.0–0.2)

## 2024-09-29 LAB — CREATININE, SERUM
Creatinine, Ser: 0.5 mg/dL (ref 0.44–1.00)
GFR, Estimated: >60 mL/min (ref >60)

## 2024-09-29 LAB — GLUCOSE, CAPILLARY
Glucose-Capillary: 144 mg/dL — ABNORMAL HIGH (ref 70–99)
Glucose-Capillary: 187 mg/dL — ABNORMAL HIGH (ref 70–99)
Glucose-Capillary: 248 mg/dL — ABNORMAL HIGH (ref 70–99)

## 2024-09-29 SURGERY — OPEN REDUCTION INTERNAL FIXATION (ORIF) DISTAL FEMUR FRACTURE
Anesthesia: General | Laterality: Left

## 2024-09-29 MED ORDER — PHENYLEPHRINE 80 MCG/ML (10ML) SYRINGE FOR IV PUSH (FOR BLOOD PRESSURE SUPPORT)
PREFILLED_SYRINGE | INTRAVENOUS | Status: AC
  Filled 2024-09-29: qty 10

## 2024-09-29 MED ORDER — SUGAMMADEX SODIUM 200 MG/2ML IV SOLN
INTRAVENOUS | Status: AC
  Filled 2024-09-29: qty 2

## 2024-09-29 MED ORDER — BUPIVACAINE HCL (PF) 0.25 % IJ SOLN
INTRAMUSCULAR | Status: DC | PRN
  Administered 2024-09-29: 30 mL

## 2024-09-29 MED ORDER — ONDANSETRON HCL 4 MG/2ML IJ SOLN
INTRAMUSCULAR | Status: DC | PRN
Start: 2024-09-29 — End: 2024-09-29
  Administered 2024-09-29: 4 mg via INTRAVENOUS

## 2024-09-29 MED ORDER — KETOROLAC TROMETHAMINE 30 MG/ML IJ SOLN
INTRAMUSCULAR | Status: AC
  Filled 2024-09-29: qty 1

## 2024-09-29 MED ORDER — FENTANYL CITRATE (PF) 100 MCG/2ML IJ SOLN
INTRAMUSCULAR | Status: DC | PRN
  Administered 2024-09-29 (×2): 25 ug via INTRAVENOUS
  Administered 2024-09-29: 50 ug via INTRAVENOUS

## 2024-09-29 MED ORDER — FENTANYL CITRATE (PF) 100 MCG/2ML IJ SOLN
INTRAMUSCULAR | Status: AC
  Filled 2024-09-29: qty 2

## 2024-09-29 MED ORDER — CEFAZOLIN SODIUM-DEXTROSE 2-4 GM/100ML-% IV SOLN
2.0000 g | INTRAVENOUS | Status: AC
  Administered 2024-09-29: 2 g via INTRAVENOUS

## 2024-09-29 MED ORDER — ROCURONIUM BROMIDE 10 MG/ML (PF) SYRINGE
PREFILLED_SYRINGE | INTRAVENOUS | Status: DC | PRN
  Administered 2024-09-29: 30 mg via INTRAVENOUS
  Administered 2024-09-29: 20 mg via INTRAVENOUS

## 2024-09-29 MED ORDER — DOCUSATE SODIUM 100 MG PO CAPS
100.0000 mg | ORAL_CAPSULE | Freq: Two times a day (BID) | ORAL | Status: DC
Start: 2024-09-29 — End: 2024-10-13
  Administered 2024-09-29 – 2024-10-13 (×17): 100 mg via ORAL
  Filled 2024-09-29 (×20): qty 1

## 2024-09-29 MED ORDER — POLYETHYLENE GLYCOL 3350 17 G PO PACK: 17.0000 g | PACK | Freq: Every day | ORAL | Status: DC | PRN

## 2024-09-29 MED ORDER — AMISULPRIDE (ANTIEMETIC) 5 MG/2ML IV SOLN
10.0000 mg | Freq: Once | INTRAVENOUS | Status: DC | PRN
Start: 2024-09-29 — End: 2024-09-29

## 2024-09-29 MED ORDER — ORAL CARE MOUTH RINSE: 15.0000 mL | Freq: Once | OROMUCOSAL | Status: DC

## 2024-09-29 MED ORDER — BUPIVACAINE HCL (PF) 0.25 % IJ SOLN
INTRAMUSCULAR | Status: AC
Start: 2024-09-29 — End: 2024-09-29
  Filled 2024-09-29: qty 30

## 2024-09-29 MED ORDER — 0.9 % SODIUM CHLORIDE (POUR BTL) OPTIME
TOPICAL | Status: DC | PRN
  Administered 2024-09-29: 1000 mL

## 2024-09-29 MED ORDER — CEFAZOLIN SODIUM-DEXTROSE 2-4 GM/100ML-% IV SOLN
INTRAVENOUS | Status: AC
  Filled 2024-09-29: qty 100

## 2024-09-29 MED ORDER — ONDANSETRON HCL 4 MG PO TABS
4.0000 mg | ORAL_TABLET | Freq: Four times a day (QID) | ORAL | Status: DC | PRN
  Administered 2024-09-30: 4 mg via ORAL
  Filled 2024-09-29: qty 1

## 2024-09-29 MED ORDER — LIDOCAINE HCL (PF) 2 % IJ SOLN
INTRAMUSCULAR | Status: AC
  Filled 2024-09-29: qty 5

## 2024-09-29 MED ORDER — ONDANSETRON HCL 4 MG/2ML IJ SOLN: 4.0000 mg | Freq: Four times a day (QID) | INTRAMUSCULAR | Status: DC | PRN

## 2024-09-29 MED ORDER — CHLORHEXIDINE GLUCONATE 0.12 % MT SOLN: 15.0000 mL | Freq: Once | OROMUCOSAL | Status: DC

## 2024-09-29 MED ORDER — FENTANYL CITRATE (PF) 50 MCG/ML IJ SOSY
25.0000 ug | PREFILLED_SYRINGE | INTRAMUSCULAR | Status: DC | PRN
  Administered 2024-09-29: 12.5 ug via INTRAVENOUS

## 2024-09-29 MED ORDER — PROPOFOL 10 MG/ML IV BOLUS
INTRAVENOUS | Status: DC | PRN
  Administered 2024-09-29: 70 mg via INTRAVENOUS

## 2024-09-29 MED ORDER — SUGAMMADEX SODIUM 200 MG/2ML IV SOLN
INTRAVENOUS | Status: DC | PRN
  Administered 2024-09-29: 100 mg via INTRAVENOUS

## 2024-09-29 MED ORDER — ESMOLOL HCL 100 MG/10ML IV SOLN
INTRAVENOUS | Status: DC | PRN
  Administered 2024-09-29: 20 mg via INTRAVENOUS

## 2024-09-29 MED ORDER — ENOXAPARIN SODIUM 40 MG/0.4ML IJ SOSY
40.0000 mg | PREFILLED_SYRINGE | INTRAMUSCULAR | Status: DC
  Administered 2024-09-30 – 2024-10-13 (×14): 40 mg via SUBCUTANEOUS
  Filled 2024-09-29 (×15): qty 0.4

## 2024-09-29 MED ORDER — PROPOFOL 10 MG/ML IV BOLUS
INTRAVENOUS | Status: AC
  Filled 2024-09-29: qty 20

## 2024-09-29 MED ORDER — ONDANSETRON HCL 4 MG/2ML IJ SOLN
INTRAMUSCULAR | Status: AC
  Filled 2024-09-29: qty 2

## 2024-09-29 MED ORDER — FENTANYL CITRATE (PF) 50 MCG/ML IJ SOSY
PREFILLED_SYRINGE | INTRAMUSCULAR | Status: AC
  Filled 2024-09-29: qty 1

## 2024-09-29 MED ORDER — CEFAZOLIN SODIUM-DEXTROSE 2-4 GM/100ML-% IV SOLN
2.0000 g | Freq: Four times a day (QID) | INTRAVENOUS | Status: AC
  Administered 2024-09-29 – 2024-09-30 (×3): 2 g via INTRAVENOUS
  Filled 2024-09-29 (×3): qty 100

## 2024-09-29 MED ORDER — GLUCERNA SHAKE PO LIQD
237.0000 mL | Freq: Three times a day (TID) | ORAL | Status: DC
  Administered 2024-09-29 – 2024-10-13 (×31): 237 mL via ORAL
  Filled 2024-09-29 (×42): qty 237

## 2024-09-29 MED ORDER — PHENYLEPHRINE 80 MCG/ML (10ML) SYRINGE FOR IV PUSH (FOR BLOOD PRESSURE SUPPORT)
PREFILLED_SYRINGE | INTRAVENOUS | Status: DC | PRN
  Administered 2024-09-29: 120 ug via INTRAVENOUS
  Administered 2024-09-29: 40 ug via INTRAVENOUS

## 2024-09-29 MED ORDER — ADULT MULTIVITAMIN W/MINERALS CH
1.0000 | ORAL_TABLET | Freq: Every day | ORAL | Status: DC
  Administered 2024-09-30 – 2024-10-13 (×13): 1 via ORAL
  Filled 2024-09-29 (×14): qty 1

## 2024-09-29 MED ORDER — DEXAMETHASONE SODIUM PHOSPHATE 4 MG/ML IJ SOLN
INTRAMUSCULAR | Status: DC | PRN
  Administered 2024-09-29: 5 mg via INTRAVENOUS

## 2024-09-29 MED ORDER — LACTATED RINGERS IV SOLN: INTRAVENOUS | Status: DC

## 2024-09-29 MED ORDER — TRANEXAMIC ACID-NACL 1000-0.7 MG/100ML-% IV SOLN
1000.0000 mg | INTRAVENOUS | Status: AC
  Administered 2024-09-29: 1000 mg via INTRAVENOUS
  Filled 2024-09-29: qty 100

## 2024-09-29 SURGICAL SUPPLY — 39 items
BAG COUNTER SPONGE SURGICOUNT (BAG) ×2 IMPLANT
BIT DRILL CROWE POINT TWST 4.3 (DRILL) IMPLANT
BLADE CLIPPER SURG (BLADE) IMPLANT
BNDG ELASTIC 4INX 5YD STR LF (GAUZE/BANDAGES/DRESSINGS) ×2 IMPLANT
BNDG ELASTIC 6INX 5YD STR LF (GAUZE/BANDAGES/DRESSINGS) ×2 IMPLANT
BNDG GAUZE DERMACEA FLUFF 4 (GAUZE/BANDAGES/DRESSINGS) ×2 IMPLANT
DRAPE C-ARM 42X120 X-RAY (DRAPES) ×2 IMPLANT
DRAPE C-ARMOR (DRAPES) ×2 IMPLANT
DRAPE IMP U-DRAPE 54X76 (DRAPES) ×2 IMPLANT
DRAPE INCISE IOBAN 66X45 STRL (DRAPES) ×2 IMPLANT
DRAPE STERI IOBAN 125X83 (DRAPES) IMPLANT
DRAPE SURG ORHT 6 SPLT 77X108 (DRAPES) ×4 IMPLANT
DRAPE U-SHAPE 47X51 STRL (DRAPES) ×2 IMPLANT
DRESSING MEPILEX FLEX 4X4 (GAUZE/BANDAGES/DRESSINGS) IMPLANT
DRSG ADAPTIC 3X8 NADH LF (GAUZE/BANDAGES/DRESSINGS) ×2 IMPLANT
DRSG MEPILEX POST OP 4X12 (GAUZE/BANDAGES/DRESSINGS) ×2 IMPLANT
ELECT REM PT RETURN 15FT ADLT (MISCELLANEOUS) ×2 IMPLANT
GAUZE PAD ABD 8X10 STRL (GAUZE/BANDAGES/DRESSINGS) ×8 IMPLANT
GAUZE SPONGE 4X4 12PLY STRL (GAUZE/BANDAGES/DRESSINGS) ×2 IMPLANT
GLOVE SURG SS PI 8.0 STRL IVOR (GLOVE) ×4 IMPLANT
GOWN L4 XXLG W/PAP TWL (GOWN DISPOSABLE) ×2 IMPLANT
KIT BASIN OR (CUSTOM PROCEDURE TRAY) ×2 IMPLANT
KIT TURNOVER KIT A (KITS) ×2 IMPLANT
MANIFOLD NEPTUNE II (INSTRUMENTS) ×2 IMPLANT
PACK TOTAL JOINT (CUSTOM PROCEDURE TRAY) ×2 IMPLANT
PACK UNIVERSAL I (CUSTOM PROCEDURE TRAY) ×2 IMPLANT
PAD ARMBOARD POSITIONER FOAM (MISCELLANEOUS) ×4 IMPLANT
PAD CAST 4YDX4 CTTN HI CHSV (CAST SUPPLIES) ×2 IMPLANT
PADDING CAST COTTON 6X4 STRL (CAST SUPPLIES) ×2 IMPLANT
SCREW BONE CORTICAL 5.0X38 (Screw) IMPLANT
SCREW BONE CORTICAL 5.0X40 (Screw) IMPLANT
SCREW BONE CORTICAL 5.0X44 (Screw) IMPLANT
STAPLER SKIN PROX 35W (STAPLE) IMPLANT
SUT MNCRL AB 4-0 PS2 18 (SUTURE) IMPLANT
SUT VIC AB 0 CT1 27XBRD ANBCTR (SUTURE) ×4 IMPLANT
SUT VIC AB 2-0 CT1 TAPERPNT 27 (SUTURE) ×4 IMPLANT
SUT VIC AB 2-0 SH 27XBRD (SUTURE) IMPLANT
TOWEL OR 17X26 10 PK STRL BLUE (TOWEL DISPOSABLE) ×4 IMPLANT
TRAY FOLEY MTR SLVR 16FR STAT (SET/KITS/TRAYS/PACK) IMPLANT

## 2024-09-29 NOTE — Plan of Care (Signed)
  Problem: Health Behavior/Discharge Planning: Goal: Ability to manage health-related needs will improve Outcome: Progressing   Problem: Coping: Goal: Level of anxiety will decrease Outcome: Progressing   Problem: Pain Managment: Goal: General experience of comfort will improve and/or be controlled Outcome: Progressing

## 2024-09-29 NOTE — Progress Notes (Signed)
 Initial Nutrition Assessment  DOCUMENTATION CODES:   Underweight  INTERVENTION:   -Glucerna Shake po TID, each supplement provides 220 kcal and 10 grams of protein   -Multivitamin with minerals daily  NUTRITION DIAGNOSIS:   Increased nutrient needs related to post-op healing, hip fracture as evidenced by estimated needs.  GOAL:   Patient will meet greater than or equal to 90% of their needs  MONITOR:   PO intake, Supplement acceptance, Diet advancement  REASON FOR ASSESSMENT:   Consult Hip fracture protocol  ASSESSMENT:   78 y.o. female with medical history significant of external pain,*Tritus, COPD, diverticulosis,/diverticulitis with perforation, nephrolithiasis, anxiety, depression, hyperlipidemia, hypertension, Alzheimer's disease, osteoporosis, history of pneumonia, type 2 diabetes, ureteral stone with hydronephrosis who underwent a left femur intramedullary nail insertion 3 weeks ago, discharged to SNF, but sent to the emergency department after having an unwitnessed fall 2 days ago and complaining of pain in the left hip area.  Left femur x-ray showing mildly displaced spiral fracture of the mid femoral shaft.  Patient with dementia, alert/oriented x1. Pt with history of recent left hip surgery in early October. Back with another fracture of left femoral shaft. Currently NPO for surgery today. Reviewed chart, pt drinks Glucerna shakes so will resume these for after surgery today. Will also add daily MVI to aid in healing.  Per weight records, weight was 97 lbs on 10/10. Current weight recorded: 104 lbs.  Medications: Dulcolax, Remeron  Labs reviewed: CBGs: 138-235 Elevated sodium  NUTRITION - FOCUSED PHYSICAL EXAM:  Unable to complete, working remotely.  Diet Order:   Diet Order             Diet NPO time specified  Diet effective midnight                   EDUCATION NEEDS:   No education needs have been identified at this time  Skin:  Skin  Assessment: Reviewed RN Assessment  Last BM:  10/31 -type 6  Height:   Ht Readings from Last 1 Encounters:  09/29/24 5' 5 (1.651 m)    Weight:   Wt Readings from Last 1 Encounters:  09/29/24 47.2 kg    BMI:  Body mass index is 17.32 kg/m.  Estimated Nutritional Needs:   Kcal:  1350-1550  Protein:  65-75g  Fluid:  1.6L/day   Morna Lee, MS, RD, LDN Inpatient Clinical Dietitian Contact via Secure chat

## 2024-09-29 NOTE — Op Note (Signed)
 DATE OF SURGERY:  09/29/2024  TIME: 2:09 PM  PATIENT NAME:  Doris Wyatt  AGE: 78 y.o.  PRE-OPERATIVE DIAGNOSIS:  Left Femur Fracture  POST-OPERATIVE DIAGNOSIS:  SAME  PROCEDURE:  OPEN REDUCTION AND INTRAMEDULLARY NAIL FIXATION OF LEFT FEMUR  SURGEON:  Cordella SHAUNNA Rhein  ASSISTANT:   OPERATIVE IMPLANTS:  Implant Name Type Inv. Item Serial No. Manufacturer Lot No. LRB No. Used Action  SCREW BONE CORTICAL 5.0X38 - [id number] Screw SCREW BONE CORTICAL 5.0X38  [employer](ORTH,[employer]AU[employer],SG) [id number] Left 1 Implanted  SCREW BONE CORTICAL 5.0X40 - [id number] Screw SCREW BONE CORTICAL 5.0X40  [employer](ORTH,TRAU,BIO,SG) [id number] Left 1 Implanted  SCREW BONE CORTICAL 5.0X44 - [id number] Screw SCREW BONE CORTICAL 5.0X44  [employer](ORTH,TRAU,BIO,SG) [id number] Left 1 Implanted    UNIQUE ASPECTS OF THE CASE:  none  ESTIMATED BLOOD LOSS: Minimal  PREOPERATIVE INDICATIONS:  Doris Wyatt is a 78 y.o. year old who fell and suffered an Left Femur Fracture. She was brought into the ER and then admitted and optimized and then elected for surgical intervention.  She presents to stand a left trochanteric hip fracture approximately 3 weeks ago.  This was fixed with an intramedullary nail.  This was a long nail.  The new fracture was in the mid femoral shaft.  This was across by the nail.  Therefore we felt that we could reduce the fracture along the nail and placed distal interlocking screws  The risks benefits and alternatives were discussed with the patient including but not limited to the risks of nonoperative treatment, versus surgical intervention including infection, bleeding, nerve injury, malunion, nonunion, hardware prominence, hardware failure, need for hardware removal, blood clots, cardiopulmonary complications, morbidity, mortality, among others, and they were willing to proceed.    OPERATIVE PROCEDURE:  The patient was brought to the operating room and placed in the supine  position. Anesthesia was administered. She was placed on the fracture table.  Closed reduction was performed under C-arm guidance.  Time out was then performed after sterile prep and drape. She received preoperative antibiotics.  We visualized the fracture under fluoroscopic imaging.  We adjusted the rotation of the femur until it matched the proper anatomy.  We then localized until interlocking screws.  The skin was Nestabs carbs and working.  2 small stab incisions were made with a 10 blade.  She was advanced bicortically.  2 screws were placed.  This was a 38 mm screw in the proximal hole and a 44 mm screw in the distal hole.  This is excellent bicortical bite.  The wounds were closed with 2-0 Vicryl followed by staples.  Final fluoroscopic images demonstrate acceptable reduction of the fracture and alignment of the rods and screws    Post op recs: WB: WBAT left LE Abx: ancef  x23 hours post op Dressing: keep intact until follow up, change PRN if soiled or saturated. DVT prophylaxis: lovenox starting POD1 x4 weeks Follow up: 2 weeks after surgery for a wound check with Dr. Rhein at [facility].  Address: [address], [place], [place] [address]  Office Phone: [phone]  Cathlyn Rhein, MD Orthopaedic Surgery

## 2024-09-29 NOTE — Anesthesia Preprocedure Evaluation (Signed)
 Anesthesia Evaluation  Patient identified by MRN, date of birth, ID band  Reviewed: Allergy & Precautions, NPO status , Patient's Chart, lab work & pertinent test results  History of Anesthesia Complications Negative for: history of anesthetic complications  Airway Mallampati: III  TM Distance: >3 FB Neck ROM: Full    Dental  (+) Missing, Edentulous Lower, Dental Advisory Given   Pulmonary COPD, former smoker   breath sounds clear to auscultation       Cardiovascular hypertension, Pt. on medications  Rhythm:Regular Rate:Tachycardia     Neuro/Psych       Dementia    GI/Hepatic negative GI ROS, Neg liver ROS,,,  Endo/Other  diabetes, Type 2, Insulin  Dependent    Renal/GU Renal disease     Musculoskeletal  (+) Arthritis ,    Abdominal   Peds  Hematology  (+) Blood dyscrasia, anemia   Anesthesia Other Findings   Reproductive/Obstetrics                              Anesthesia Physical Anesthesia Plan  ASA: 3  Anesthesia Plan: General   Post-op Pain Management: Ofirmev  IV (intra-op)*   Induction: Intravenous  PONV Risk Score and Plan: 3 and Dexamethasone  and Ondansetron   Airway Management Planned: Oral ETT  Additional Equipment:   Intra-op Plan:   Post-operative Plan: Extubation in OR  Informed Consent: I have reviewed the patients History and Physical, chart, labs and discussed the procedure including the risks, benefits and alternatives for the proposed anesthesia with the patient or authorized representative who has indicated his/her understanding and acceptance.     Dental advisory given  Plan Discussed with:   Anesthesia Plan Comments:          Anesthesia Quick Evaluation

## 2024-09-29 NOTE — Anesthesia Procedure Notes (Signed)
 Procedure Name: Intubation Date/Time: 09/29/2024 1:16 PM  Performed by: Judythe Tanda Aran, CRNAPre-anesthesia Checklist: Patient identified, Emergency Drugs available, Suction available and Patient being monitored Patient Re-evaluated:Patient Re-evaluated prior to induction Oxygen Delivery Method: Circle system utilized Preoxygenation: Pre-oxygenation with 100% oxygen Induction Type: IV induction Ventilation: Mask ventilation without difficulty Laryngoscope Size: Mac and 3 Grade View: Grade I Tube type: Oral Tube size: 7.0 mm Number of attempts: 1 Airway Equipment and Method: Stylet Placement Confirmation: ETT inserted through vocal cords under direct vision, positive ETCO2 and breath sounds checked- equal and bilateral Secured at: 20 cm Tube secured with: Tape Dental Injury: Teeth and Oropharynx as per pre-operative assessment  Comments: Pt had preexisting loose tooth upper left incisior.   Blood noted in mouth and around tooth preop

## 2024-09-29 NOTE — Progress Notes (Signed)
 PROGRESS NOTE  Doris Wyatt FMW:[id number] DOB: 03-22-1946 DOA: 09/28/2024 PCP: [name], MD   LOS: 1 day   Brief narrative:   Doris Wyatt is a 78 y.o. female with past medical history of COPD, diverticulosis,/diverticulitis with perforation, nephrolithiasis, anxiety, depression, hyperlipidemia, hypertension, Alzheimer's disease, type 2 diabetes, who had recently undergone a left femur intramedullary nail insertion 3 weeks ago and was discharged to SNF presented to the ED after sustaining a unwitnessed fall 2 days back with pain on the left hip.  In the ED, vitals were stable.  Blood pressure slightly elevated.  Labs were unremarkable.  Hemoglobin was 11.3.  X-ray of the left femur showed mildly displaced spiral fracture of the mid femoral shaft with evidence of open reduction and internal fixation of recent intertrochanteric fracture with orthopedic screws.  Orthopedics was consulted and patient was considered for admission to the hospital for further evaluation and treatment.   Assessment/Plan: Principal Problem:   Left femoral shaft fracture (HCC) Active Problems:   Hypertension associated with diabetes (HCC)   COPD (chronic obstructive pulmonary disease)   Hyperlipidemia associated with type 2 diabetes mellitus (HCC)   Type 2 diabetes mellitus (HCC)   Generalized anxiety disorder   Osteoporosis   Major neurocognitive disorder due to Alzheimer's disease  Left femoral shaft fracture  Continue supportive care including immobilization traction analgesics.  Orthopedic surgery has been consulted.  Patient is currently NPO.  Plan for surgical intervention.    Hypertension associated with diabetes Continue Avapro      COPD (chronic obstructive pulmonary disease) Continue bronchodilators     Hyperlipidemia  Continue Lipitor    Type 2 diabetes mellitus  Continue diabetic diet sliding scale insulin  Accu-Cheks and long-acting insulin .  Latest POC glucose of 144      Generalized anxiety disorder Continue Remeron     Osteoporosis Follow-up with PCP as outpatient   Alzheimer's dementia Continue memantine  and divalproex  .  Patient is alert awake and Communicative  DVT prophylaxis: SCDs Start: 09/28/24 1418 SCDs Start: 09/28/24 1414   Disposition: Skilled nursing facility, patient from skilled nursing facility  Status is: Inpatient Remains inpatient appropriate because: Pending hip surgery, need for rehabilitation    Code Status:     Code Status: Full Code  Family Communication: None at bedside  Consultants: Orthopedics  Procedures: None yet  Anti-infectives:  None  Anti-infectives (From admission, onward)    None       Subjective: Today, patient was seen and examined at bedside.  Patient comes of left hip pain.  Denies any shortness of breath chest pain palpitation nausea vomiting or abdominal pain.  Objective: Vitals:   09/29/24 0150 09/29/24 0600  BP: (!) 122/91 (!) 148/59  Pulse: 76 81  Resp: 15 16  Temp: 98.6 F (37 C) 98 F (36.7 C)  SpO2: 95% 94%    Intake/Output Summary (Last 24 hours) at 09/29/2024 1117 Last data filed at 09/29/2024 0610 Gross per 24 hour  Intake 360 ml  Output --  Net 360 ml   Filed Weights   09/29/24 1012  Weight: 47.2 kg   Body mass index is 17.32 kg/m.   Physical Exam:  GENERAL:  Not in obvious distress, Communicative, alert awake.  Elderly female, appears deconditioned HENT: No scleral pallor or icterus. Pupils equally reactive to light. Oral mucosa is moist NECK: is supple, no gross swelling noted. CHEST: Clear to auscultation. No crackles or wheezes.   CVS: S1 and S2 heard, no murmur. Regular rate and rhythm.  ABDOMEN: Soft, non-tender, bowel sounds are present. EXTREMITIES left hip with tenderness on palpation. CNS: Cranial nerves are intact.  SKIN: warm and dry without rashes.  Data Review: I have personally reviewed the following laboratory data and  studies,  CBC: Recent Labs  Lab 09/28/24 1122 09/29/24 0346  WBC 10.3 8.0  NEUTROABS 8.4*  --   HGB 11.3* 10.5*  HCT 37.7 34.5*  MCV 100.0 101.2*  PLT 567* 454*   Basic Metabolic Panel: Recent Labs  Lab 09/28/24 1122 09/29/24 0346  NA 145 146*  K 3.9 3.7  CL 107 108  CO2 25 27  GLUCOSE 267* 139*  BUN 20 18  CREATININE 0.45 0.38*  CALCIUM  9.1 8.6*   Liver Function Tests: Recent Labs  Lab 09/29/24 0346  AST 19  ALT 17  ALKPHOS 149*  BILITOT 0.7  PROT 6.1*  ALBUMIN 3.3*   No results for input(s): LIPASE, AMYLASE in the last 168 hours. No results for input(s): AMMONIA in the last 168 hours. Cardiac Enzymes: No results for input(s): CKTOTAL, CKMB, CKMBINDEX, TROPONINI in the last 168 hours. BNP (last 3 results) No results for input(s): BNP in the last 8760 hours.  ProBNP (last 3 results) No results for input(s): PROBNP in the last 8760 hours.  CBG: Recent Labs  Lab 09/28/24 1608 09/28/24 2134 09/29/24 0814  GLUCAP 138* 235* 144*   Recent Results (from the past 240 hours)  Surgical pcr screen     Status: None   Collection Time: 09/28/24  5:43 PM   Specimen: Nasal Mucosa; Nasal Swab  Result Value Ref Range Status   MRSA, PCR NEGATIVE NEGATIVE Final   Staphylococcus aureus NEGATIVE NEGATIVE Final    Comment: (NOTE) The Xpert SA Assay (FDA approved for NASAL specimens in patients 82 years of age and older), is one component of a comprehensive surveillance program. It is not intended to diagnose infection nor to guide or monitor treatment. Performed at [facility], [address], [place], [place] [address]      Studies: Chest Portable 1 View Result Date: 09/28/2024 EXAM: 1 VIEW(S) XRAY OF THE CHEST 09/28/2024 03:25:00 PM COMPARISON: 09/07/2024 CLINICAL HISTORY: Preop cardiovascular exam FINDINGS: LUNGS AND PLEURA: Hyperinflated lungs. No focal pulmonary opacity. No pulmonary edema. No pleural effusion. No  pneumothorax. HEART AND MEDIASTINUM: Aortic atherosclerosis. No acute abnormality of the cardiac and mediastinal silhouettes. BONES AND SOFT TISSUES: Remote right rib fracture. No acute osseous abnormality. IMPRESSION: 1. No acute cardiopulmonary findings. Emphysema. Electronically signed by: Norman Gatlin MD 09/28/2024 03:39 PM EDT RP Workstation: [id number]   DG Femur Min 2 Views Left Result Date: 09/28/2024 EXAM: 2 VIEW(S) XRAY OF THE LEFT FEMUR 09/28/2024 12:17:00 PM COMPARISON: Left femur series dated 09/08/2024 and 09/07/2024. CLINICAL HISTORY: fall fall FINDINGS: BONES AND JOINTS: Spiral mildly displaced fracture of the mid femoral shaft. Status post open reduction and internal fixation of a recent intertrochanteric fracture with an orthopedic screw within the femoral head and neck and anchoring rod extending the length of the femoral shaft. No joint dislocation. SOFT TISSUES: There are vascular calcifications present. IMPRESSION: 1. Spiral mildly displaced fracture of the mid femoral shaft, developed since prior studies. 2. Status post open reduction and internal fixation of a recent intertrochanteric fracture with an orthopedic screw within the femoral head and neck and anchoring rod extending the length of the femoral shaft. Electronically signed by: Evalene Coho MD 09/28/2024 12:38 PM EDT RP Workstation: [id number]      Vernal Alstrom, MD  [facility]  09/29/2024  If 7PM-7AM, please contact night-coverage

## 2024-09-29 NOTE — Transfer of Care (Signed)
 Immediate Anesthesia Transfer of Care Note  Patient: Doris Wyatt  Procedure(s) Performed: OPEN REDUCTION AND INTRAMEDULLARY NAIL FIXATION OF LEFT FEMUR (Left)  Patient Location: PACU  Anesthesia Type:General  Level of Consciousness: drowsy  Airway & Oxygen Therapy: Patient Spontanous Breathing and Patient connected to face mask  Post-op Assessment: Report given to RN and Post -op Vital signs reviewed and stable  Post vital signs: Reviewed and stable  Last Vitals:  Vitals Value Taken Time  BP 139/58 09/29/24 14:05  Temp    Pulse 94 09/29/24 14:08  Resp 19 09/29/24 14:08  SpO2 93 % 09/29/24 14:08  Vitals shown include unfiled device data.  Last Pain:  Vitals:   09/29/24 1144  TempSrc: Oral  PainSc:          Complications: No notable events documented.

## 2024-09-29 NOTE — Hospital Course (Addendum)
 77yo with h/o COPD, anxiety/depression, HTN, HLD, Alzheimer's dementia, and T2DM who presented on 10/30 with a fall.  She had previously had a fall with hip fracture and was hospitalized from 10/9-16 and discharged to SNF s/p L femur intramedullary rod insertion.  This time she had a femoral shaft fracture and underwent ORIF and nail insertion again on 10/30.  Hospital course prolonged while awaiting SNF placement for rehab.

## 2024-09-29 NOTE — Interval H&P Note (Signed)
 History and Physical Interval Note:  09/29/2024 12:18 PM  Doris Wyatt  has presented today for surgery, with the diagnosis of Left Femur Fracture.  The various methods of treatment have been discussed with the patient and family. After consideration of risks, benefits and other options for treatment, the patient has consented to  Procedure(s) with comments: OPEN REDUCTION INTERNAL FIXATION (ORIF) DISTAL FEMUR FRACTURE (Left) - ORIF Left Femur Fracture as a surgical intervention.  The patient's history has been reviewed, patient examined, no change in status, stable for surgery.  I have reviewed the patient's chart and labs.  Questions were answered to the patient's satisfaction.     Cordella SHAUNNA Rhein

## 2024-09-29 NOTE — TOC Initial Note (Addendum)
 Transition of Care [facility]) - Initial/Assessment Note    Patient Details  Name: Doris Wyatt MRN: [id number] Date of Birth: [birth year]  [facility]) CM/SW Contact:    Alfonse JONELLE Rex, RN Phone Number: 09/29/2024, 1:14 PM  Clinical Narrative:  Patient with hx of Alzheimer Dementia with recent admission, underwent IM Nail of Left femur  fracture on 09/08/24. DC to [facility] on  09/14/24 for short term rehab. Readmitted on 10/31 after fall sustaining  a left femur fracture. TO Or today for repair.  PT/OT eval post op pending, await recommendation. IP Care Mgmt will continue to follow.     -3:27pm Call to [facility], admit coord w/[facility] to inquire if family held the room for patient to return. Per [place], with patient's [facility] Plan, there is a $10/day copay for day 1-20, [place] reports there is an outstanding bill that would need to be settled if patient was to return. IP Care Mgmt will continue to follow.       Expected Discharge Plan: Skilled Nursing Facility Barriers to Discharge: Continued Medical Work up   Patient Goals and CMS Choice Patient states their goals for this hospitalization and ongoing recovery are:: return home          Expected Discharge Plan and Services                                              Prior Living Arrangements/Services   Lives with:: Adult Children Patient language and need for interpreter reviewed:: Yes Do you feel safe going back to the place where you live?: Yes      Need for Family Participation in Patient Care: Yes (Comment) Care giver support system in place?: Yes (comment)   Criminal Activity/Legal Involvement Pertinent to Current Situation/Hospitalization: No - Comment as needed  Activities of Daily Living   ADL Screening (condition at time of admission) Independently performs ADLs?: No Does the patient have a NEW difficulty with bathing/dressing/toileting/self-feeding that is expected to  last >3 days?: Yes (Initiates electronic notice to provider for possible OT consult) Does the patient have a NEW difficulty with getting in/out of bed, walking, or climbing stairs that is expected to last >3 days?: Yes (Initiates electronic notice to provider for possible PT consult) Does the patient have a NEW difficulty with communication that is expected to last >3 days?: Yes (Initiates electronic notice to provider for possible SLP consult) Is the patient deaf or have difficulty hearing?: No Does the patient have difficulty seeing, even when wearing glasses/contacts?: Yes Does the patient have difficulty concentrating, remembering, or making decisions?: Yes  Permission Sought/Granted                  Emotional Assessment       Orientation: : Oriented to Self Alcohol / Substance Use: Not Applicable Psych Involvement: No (comment)  Admission diagnosis:  Left femoral shaft fracture (HCC) [S72.302A] Periprosthetic fracture around internal prosthetic left hip joint, initial encounter (HCC) [F02.02XA] Fall, initial encounter [id number]] Patient Active Problem List   Diagnosis Date Noted   Left femoral shaft fracture (HCC) 09/28/2024   Protein-calorie malnutrition, severe 09/08/2024   Closed comminuted intertrochanteric fracture of proximal femur, left, initial encounter (HCC) 09/07/2024   Generalized anxiety disorder 07/01/2021   Osteoporosis 07/01/2021   Major neurocognitive disorder due to Alzheimer's disease 07/01/2021   Diverticulitis of large  intestine with perforation without abscess or bleeding    Ureteral stone with hydronephrosis 10/31/2017   Hypertension associated with diabetes (HCC)    COPD (chronic obstructive pulmonary disease)    Hyperlipidemia associated with type 2 diabetes mellitus (HCC)    Type 2 diabetes mellitus (HCC)    PCP:  [name], MD Pharmacy:   [facility] [phone] - [place], [place] - [address] [place] [address] Phone:  [phone] Fax: [phone]     Social Drivers of Health (SDOH) Social History: SDOH Screenings   Food Insecurity: Patient Unable To Answer (09/28/2024)  Housing: Unknown (09/28/2024)  Transportation Needs: Patient Unable To Answer (09/28/2024)  Utilities: Patient Unable To Answer (09/28/2024)  Depression (PHQ2-9): Low Risk  (11/16/2019)  Social Connections: Unknown (09/28/2024)  Tobacco Use: Medium Risk (09/28/2024)   SDOH Interventions:     Readmission Risk Interventions    09/29/2024    1:12 PM 09/09/2024   12:13 PM  Readmission Risk Prevention Plan  Post Dischage Appt  Complete  Medication Screening  Complete  Transportation Screening Complete Complete  PCP or Specialist Appt within 5-7 Days Complete   Home Care Screening Complete   Medication Review (RN CM) Complete

## 2024-09-30 DIAGNOSIS — S72342A Displaced spiral fracture of shaft of left femur, initial encounter for closed fracture: Secondary | ICD-10-CM | POA: Diagnosis not present

## 2024-09-30 LAB — CBC
HCT: 34.7 % — ABNORMAL LOW (ref 36.0–46.0)
Hemoglobin: 10.6 g/dL — ABNORMAL LOW (ref 12.0–15.0)
MCH: 30.5 pg (ref 26.0–34.0)
MCHC: 30.5 g/dL (ref 30.0–36.0)
MCV: 100 fL (ref 80.0–100.0)
Platelets: 413 K/uL — ABNORMAL HIGH (ref 150–400)
RBC: 3.47 MIL/uL — ABNORMAL LOW (ref 3.87–5.11)
RDW: 15.6 % — ABNORMAL HIGH (ref 11.5–15.5)
WBC: 7.9 K/uL (ref 4.0–10.5)
nRBC: 0 % (ref 0.0–0.2)

## 2024-09-30 LAB — GLUCOSE, CAPILLARY
Glucose-Capillary: 186 mg/dL — ABNORMAL HIGH (ref 70–99)
Glucose-Capillary: 223 mg/dL — ABNORMAL HIGH (ref 70–99)
Glucose-Capillary: 169 mg/dL — ABNORMAL HIGH (ref 70–99)
Glucose-Capillary: 198 mg/dL — ABNORMAL HIGH (ref 70–99)

## 2024-09-30 LAB — BASIC METABOLIC PANEL WITH GFR
Anion gap: 12 (ref 5–15)
BUN: 15 mg/dL (ref 8–23)
CO2: 23 mmol/L (ref 22–32)
Calcium: 8.4 mg/dL — ABNORMAL LOW (ref 8.9–10.3)
Chloride: 107 mmol/L (ref 98–111)
Creatinine, Ser: 0.38 mg/dL — ABNORMAL LOW (ref 0.44–1.00)
GFR, Estimated: >60 mL/min (ref >60)
Glucose, Bld: 170 mg/dL — ABNORMAL HIGH (ref 70–99)
Potassium: 4.5 mmol/L (ref 3.5–5.1)
Sodium: 141 mmol/L (ref 135–145)

## 2024-09-30 LAB — MAGNESIUM: Magnesium: 1.9 mg/dL (ref 1.7–2.4)

## 2024-09-30 MED ORDER — HYDROMORPHONE HCL 1 MG/ML IJ SOLN
0.5000 mg | INTRAMUSCULAR | Status: DC | PRN
  Administered 2024-09-30 – 2024-10-07 (×15): 0.5 mg via INTRAVENOUS
  Filled 2024-09-30 (×15): qty 0.5

## 2024-09-30 NOTE — Evaluation (Signed)
 Occupational Therapy Evaluation Patient Details Name: Doris Wyatt MRN: [id number] DOB: February 07, 1946 Today's Date: 09/30/2024   History of Present Illness   78 yr old female admitted to the hospital 09-28-24 with a L femur fracture after a fall. She is s/p IM nail fixation on 09/29/24. Of note. she also sustained a recent L intertrochanteric fracture and is s/p with IM rod insertion 09/08/24; discharged to SNF rehab.  PMH: Alzheimer's dementia, DM, COPD, MDD, back surgery, osteoporosis, falls.     Clinical Impressions The pt is currently presenting below her baseline level of functioning for ADL management. She is limited by the below listed deficits (see OT problem list). As such, her occupational performance is compromised and she requires significant assist for tasks such as, lower body dressing, bathing, and toileting. During the session today, she required max assist to roll to the right in bed, and she presented with noted pain response and guarding with activity attempts. She will benefit from further OT services to maximize her ADL performance and to decrease the risk for restricted ADL participation and progressive weakness.  Patient will benefit from continued inpatient follow up therapy, <3 hours/day.      If plan is discharge home, recommend the following:   A lot of help with bathing/dressing/bathroom;Assistance with cooking/housework;Assist for transportation;Help with stairs or ramp for entrance;Direct supervision/assist for medications management;Supervision due to cognitive status;A lot of help with walking and/or transfers     Functional Status Assessment   Patient has had a recent decline in their functional status and demonstrates the ability to make significant improvements in function in a reasonable and predictable amount of time.     Equipment Recommendations   Other (comment) (to be determined pending functional progress)     Recommendations for Other  Services         Precautions/Restrictions   Precautions Precautions: Fall Recall of Precautions/Restrictions: Impaired Restrictions Weight Bearing Restrictions Per Provider Order: Yes LLE Weight Bearing Per Provider Order: Weight bearing as tolerated     Mobility Bed Mobility Overal bed mobility: Needs Assistance Bed Mobility: Rolling (rolling to the right) Rolling: Max assist                 Balance       Sitting balance - Comments: not assessed       Standing balance comment: not assessed         ADL either performed or assessed with clinical judgement   ADL Overall ADL's : Needs assistance/impaired Eating/Feeding: Set up;Supervision/ safety;Bed level Eating/Feeding Details (indicate cue type and reason): She drank water from a cup in bed. Grooming: Set up;Supervision/safety;Bed level;Cueing for sequencing   Upper Body Bathing: Minimal assistance;Bed level   Lower Body Bathing: Maximal assistance;Bed level   Upper Body Dressing : Moderate assistance;Bed level   Lower Body Dressing: Total assistance;Bed level       Toileting- Clothing Manipulation and Hygiene: Total assistance;Bed level                Pertinent Vitals/Pain Pain Assessment Pain Assessment: Faces Pain Score: 6  Pain Location: LLE with movement Pain Intervention(s): Limited activity within patient's tolerance, Monitored during session, Repositioned     Extremity/Trunk Assessment Upper Extremity Assessment Upper Extremity Assessment: Right hand dominant;Generalized weakness;RUE deficits/detail;LUE deficits/detail RUE Deficits / Details: AROM WFL. Grip strength 3+/5 LUE Deficits / Details: AROM WFL. Grip strength 3+/5   Lower Extremity Assessment Lower Extremity Assessment: Generalized weakness;RLE deficits/detail RLE Deficits / Details: AROM WFL LLE: Unable to  fully assess due to pain (Required AAROM, though her overall tolerance for formal testing was limited due to  pain)       Communication Communication Communication: No apparent difficulties   Cognition Arousal: Lethargic Behavior During Therapy: WFL for tasks assessed/performed Cognition: History of cognitive impairments   Orientation impairments: Time, Situation, Place Awareness: Online awareness impaired Memory impairment (select all impairments): Declarative long-term memory, Working memory Attention impairment (select first level of impairment): Divided attention, Sustained attention Executive functioning impairment (select all impairments): Reasoning, Organization        Following commands: Impaired Following commands impaired: Only follows one step commands consistently, Follows one step commands with increased time     Cueing  General Comments   Cueing Techniques: Verbal cues;Gestural cues;Tactile cues              Home Living Family/patient expects to be discharged to:: Skilled nursing facility Living Arrangements: Children (daughter and son-in-law)   Type of Home: House Home Access: Stairs to enter [employer] of Steps: 1 small threshold step   Home Layout: One level     Bathroom Shower/Tub: Dietitian: Wheelchair - manual   Additional Comments:  (the pt's son and daughter-in-law were present during the session and provided info regarding the pt's prior level of functioning and living situation)      Prior Functioning/Environment Prior Level of Function : Needs assist        Mobility Comments:  (Pt was independent with ambulation.) ADLs Comments:  (The pt was modified independent to independent with feeding, toileting, and dressing. She required supervision for bathing & assist for tub transfers. Her family managed cooking and cleaning.)    OT Problem List: Decreased strength;Decreased range of motion;Decreased activity tolerance;Impaired balance (sitting and/or standing);Decreased cognition;Decreased safety  awareness;Pain;Decreased knowledge of use of DME or AE   OT Treatment/Interventions: Self-care/ADL training;Therapeutic exercise;DME and/or AE instruction;Energy conservation;Therapeutic activities;Patient/family education;Balance training;Cognitive remediation/compensation      OT Goals(Current goals can be found in the care plan section)   Acute Rehab OT Goals OT Goal Formulation: With patient/family Time For Goal Achievement: 10/14/24 Potential to Achieve Goals: Good ADL Goals Pt Will Perform Grooming: with set-up;sitting Pt Will Perform Upper Body Dressing: with set-up;with supervision;sitting Pt Will Transfer to Toilet: with min assist;bedside commode;stand pivot transfer Additional ADL Goal #1: Pt will perform bed mobility with min assist, in prep for progressive ADL participation.   OT Frequency:  Min 2X/week       AM-PAC OT 6 Clicks Daily Activity     Outcome Measure Help from another person eating meals?: A Little Help from another person taking care of personal grooming?: A Little Help from another person toileting, which includes using toliet, bedpan, or urinal?: Total Help from another person bathing (including washing, rinsing, drying)?: A Lot Help from another person to put on and taking off regular upper body clothing?: A Lot Help from another person to put on and taking off regular lower body clothing?: Total 6 Click Score: 12   End of Session Equipment Utilized During Treatment: Other (comment) (N/A) Nurse Communication: Mobility status  Activity Tolerance: Patient limited by pain Patient left: in bed;with call bell/phone within reach;with bed alarm set;with family/visitor present  OT Visit Diagnosis: Unsteadiness on feet (R26.81);Other abnormalities of gait and mobility (R26.89);Muscle weakness (generalized) (M62.81);Pain;Other symptoms and signs involving cognitive function;History of falling (Z91.81) Pain - Right/Left: Left Pain - part of body: Hip  Time: 1709-1730 OT Time Calculation (min): 21 min Charges:  OT General Charges $OT Visit: 1 Visit OT Evaluation $OT Eval Moderate Complexity: 1 Mod    Alianis Trimmer J Harris, OTR/L 09/30/2024, 5:57 PM

## 2024-09-30 NOTE — Progress Notes (Signed)
 PROGRESS NOTE  Doris Wyatt Dy FMW:[id number] DOB: 11-Mar-1946 DOA: 09/28/2024 PCP: [name], MD   LOS: 2 days   Brief narrative:   Doris Wyatt is a 78 y.o. female with past medical history of COPD, diverticulosis,/diverticulitis with perforation, nephrolithiasis, anxiety, depression, hyperlipidemia, hypertension, Alzheimer's disease, type 2 diabetes, who had recently undergone a left femur intramedullary nail insertion 3 weeks ago and was discharged to SNF presented to the ED after sustaining a unwitnessed fall 2 days back with pain on the left hip.  In the ED, vitals were stable.  Blood pressure slightly elevated.  Labs were unremarkable.  Hemoglobin was 11.3.  X-ray of the left femur showed mildly displaced spiral fracture of the mid femoral shaft with evidence of open reduction and internal fixation of recent intertrochanteric fracture with orthopedic screws.  Orthopedics was consulted and patient was considered for admission to the hospital for further evaluation and treatment.   Assessment/Plan: Principal Problem:   Left femoral shaft fracture (HCC) Active Problems:   Hypertension associated with diabetes (HCC)   COPD (chronic obstructive pulmonary disease)   Hyperlipidemia associated with type 2 diabetes mellitus (HCC)   Type 2 diabetes mellitus (HCC)   Generalized anxiety disorder   Osteoporosis   Major neurocognitive disorder due to Alzheimer's disease  Left femoral shaft fracture  Status post IM nailing of the left humerus on 09/28/2024.  Weightbearing as tolerated as per orthopedics.  Will get PT evaluation.  Postop antibiotics.  Recommend Lovenox starting postop day 1 for 4 weeks.  Follow-up with [facility] Dr. Reyne for 2 weeks for wound check.    Hypertension associated with diabetes Continue Avapro .  On regular diet.  Blood pressure seems to be stable.     COPD (chronic obstructive pulmonary disease) Continue bronchodilators.  Appears  compensated at this time.     Hyperlipidemia  Continue Lipitor    Type 2 diabetes mellitus with hyperglycemia Continue diabetic diet sliding scale insulin  Accu-Cheks and long-acting insulin .  Latest POC glucose of 186.  On regular diet at this time.     Generalized anxiety disorder Continue Remeron     Osteoporosis Follow-up with PCP as outpatient   Alzheimer's dementia Continue memantine  and divalproex  .  Patient is alert awake and Communicative  DVT prophylaxis: enoxaparin (LOVENOX) injection 40 mg Start: 09/30/24 1000 SCDs Start: 09/29/24 1418 SCDs Start: 09/28/24 1418 SCDs Start: 09/28/24 1414   Disposition: Skilled nursing facility, PT evaluation pending., patient from skilled nursing facility  Status is: Inpatient Remains inpatient appropriate because: Status post hip surgery, need for rehabilitation    Code Status:     Code Status: Full Code  Family Communication: None at bedside.  Tried to reach out to the patient's son on the phone but was unable to reach out to him today.  Unable to reach patient's daughter as well.  Consultants: Orthopedics  Procedures: IM nailing of the left hip on 09/28/2024 by orthopedics  Anti-infectives:  None  Anti-infectives (From admission, onward)    Start     Dose/Rate Route Frequency Ordered Stop   09/30/24 1200  ceFAZolin  (ANCEF ) IVPB 2g/100 mL premix        2 g 200 mL/hr over 30 Minutes Intravenous On call to O.R. 09/29/24 1117 09/29/24 1323   09/29/24 1700  ceFAZolin  (ANCEF ) IVPB 2g/100 mL premix        2 g 200 mL/hr over 30 Minutes Intravenous Every 6 hours 09/29/24 1417 09/30/24 0447   09/29/24 1141  ceFAZolin  (ANCEF )  2-4 GM/100ML-% IVPB       Note to Pharmacy: Chauncey Dace D: cabinet override      09/29/24 1141 09/29/24 1338       Subjective: Today, patient was seen and examined at bedside.  Patient complains of mild left hip pain otherwise no other symptoms.  Denies any shortness of breath cough fever  chills or rigor.  Objective: Vitals:   09/30/24 0410 09/30/24 0413  BP: (!) 149/70 (!) 149/70  Pulse: 67 74  Resp:  14  Temp: 98.4 F (36.9 C) 98.4 F (36.9 C)  SpO2: 98% 99%    Intake/Output Summary (Last 24 hours) at 09/30/2024 0844 Last data filed at 09/30/2024 0600 Gross per 24 hour  Intake 1480 ml  Output 660 ml  Net 820 ml   Filed Weights   09/29/24 1012  Weight: 47.2 kg   Body mass index is 17.32 kg/m.   Physical Exam:  GENERAL:  Not in obvious distress, Communicative, alert awake and oriented.  Elderly female, appears deconditioned, thinly built, HENT: No scleral pallor or icterus. Pupils equally reactive to light. Oral mucosa is moist NECK: is supple, no gross swelling noted. CHEST: Clear to auscultation. No crackles or wheezes.   CVS: S1 and S2 heard, no murmur. Regular rate and rhythm.  ABDOMEN: Soft, non-tender, bowel sounds are present. EXTREMITIES left hip with tenderness on palpation. CNS: Cranial nerves are intact.  SKIN: warm and dry without rashes.  Data Review: I have personally reviewed the following laboratory data and studies,  CBC: Recent Labs  Lab 09/28/24 1122 09/29/24 0346 09/29/24 1601 09/30/24 0335  WBC 10.3 8.0 8.9 7.9  NEUTROABS 8.4*  --   --   --   HGB 11.3* 10.5* 11.1* 10.6*  HCT 37.7 34.5* 38.5 34.7*  MCV 100.0 101.2* 104.6* 100.0  PLT 567* 454* 404* 413*   Basic Metabolic Panel: Recent Labs  Lab 09/28/24 1122 09/29/24 0346 09/29/24 1426 09/30/24 0335  NA 145 146*  --  141  K 3.9 3.7  --  4.5  CL 107 108  --  107  CO2 25 27  --  23  GLUCOSE 267* 139*  --  170*  BUN 20 18  --  15  CREATININE 0.45 0.38* 0.50 0.38*  CALCIUM  9.1 8.6*  --  8.4*  MG  --   --   --  1.9   Liver Function Tests: Recent Labs  Lab 09/29/24 0346  AST 19  ALT 17  ALKPHOS 149*  BILITOT 0.7  PROT 6.1*  ALBUMIN 3.3*   No results for input(s): LIPASE, AMYLASE in the last 168 hours. No results for input(s): AMMONIA in the last 168  hours. Cardiac Enzymes: No results for input(s): CKTOTAL, CKMB, CKMBINDEX, TROPONINI in the last 168 hours. BNP (last 3 results) No results for input(s): BNP in the last 8760 hours.  ProBNP (last 3 results) No results for input(s): PROBNP in the last 8760 hours.  CBG: Recent Labs  Lab 09/28/24 2134 09/29/24 0814 09/29/24 1637 09/29/24 2102 09/30/24 0726  GLUCAP 235* 144* 187* 248* 186*   Recent Results (from the past 240 hours)  Surgical pcr screen     Status: None   Collection Time: 09/28/24  5:43 PM   Specimen: Nasal Mucosa; Nasal Swab  Result Value Ref Range Status   MRSA, PCR NEGATIVE NEGATIVE Final   Staphylococcus aureus NEGATIVE NEGATIVE Final    Comment: (NOTE) The Xpert SA Assay (FDA approved for NASAL specimens in patients 22 years  of age and older), is one component of a comprehensive surveillance program. It is not intended to diagnose infection nor to guide or monitor treatment. Performed at [facility], [address], [place], [place] [address]      Studies: DG FEMUR MIN 2 VIEWS LEFT Result Date: 09/29/2024 EXAM: 2 VIEW(S) XRAY OF THE LEFT FEMUR 09/29/2024 01:52:00 PM COMPARISON: 09/28/2024 CLINICAL HISTORY: [id number] Surgery, elective 886218 Surgery, elective [id number] FINDINGS: BONES AND JOINTS: Intraoperative spot images demonstrate placement of intramedullary rod across the mid left femoral fracture. No hardware complicating feature. SOFT TISSUES: The soft tissues are unremarkable. IMPRESSION: 1. Placement of intramedullary rod across the mid left femoral fracture without hardware complication. Electronically signed by: Franky Crease MD 09/29/2024 07:30 PM EDT RP Workstation: [id number]   DG C-Arm 1-60 Min-No Report Result Date: 09/29/2024 Fluoroscopy was utilized by the requesting physician.  No radiographic interpretation.   Chest Portable 1 View Result Date: 09/28/2024 EXAM: 1 VIEW(S) XRAY OF THE CHEST 09/28/2024 03:25:00 PM  COMPARISON: 09/07/2024 CLINICAL HISTORY: Preop cardiovascular exam FINDINGS: LUNGS AND PLEURA: Hyperinflated lungs. No focal pulmonary opacity. No pulmonary edema. No pleural effusion. No pneumothorax. HEART AND MEDIASTINUM: Aortic atherosclerosis. No acute abnormality of the cardiac and mediastinal silhouettes. BONES AND SOFT TISSUES: Remote right rib fracture. No acute osseous abnormality. IMPRESSION: 1. No acute cardiopulmonary findings. Emphysema. Electronically signed by: Norman Gatlin MD 09/28/2024 03:39 PM EDT RP Workstation: [id number]   DG Femur Min 2 Views Left Result Date: 09/28/2024 EXAM: 2 VIEW(S) XRAY OF THE LEFT FEMUR 09/28/2024 12:17:00 PM COMPARISON: Left femur series dated 09/08/2024 and 09/07/2024. CLINICAL HISTORY: fall fall FINDINGS: BONES AND JOINTS: Spiral mildly displaced fracture of the mid femoral shaft. Status post open reduction and internal fixation of a recent intertrochanteric fracture with an orthopedic screw within the femoral head and neck and anchoring rod extending the length of the femoral shaft. No joint dislocation. SOFT TISSUES: There are vascular calcifications present. IMPRESSION: 1. Spiral mildly displaced fracture of the mid femoral shaft, developed since prior studies. 2. Status post open reduction and internal fixation of a recent intertrochanteric fracture with an orthopedic screw within the femoral head and neck and anchoring rod extending the length of the femoral shaft. Electronically signed by: Evalene Coho MD 09/28/2024 12:38 PM EDT RP Workstation: [id number]      Vernal Alstrom, MD  [facility] 09/30/2024  If 7PM-7AM, please contact night-coverage

## 2024-09-30 NOTE — Evaluation (Signed)
 Physical Therapy Evaluation Patient Details Name: Doris Wyatt MRN: [id number] DOB: [birth year] Today's Date: 09/30/2024  History of Present Illness  78 yo female admitted with L femoral shaft fracture-s/p ORIF 10/31. Previous L hip fx with IM rod insertion 09/08/24. Hx of Alz, DM, COPD, MDD, back surgery, osteoporosis, falls.  Clinical Impression  On eval, pt required Total A +2 for mobility. She was able to sit EOB for ~ 5 minutes, eventually with CGA and heavy reliance on bil UEs for stabilizing support. Pt reports pain throughout session, at rest and with activity. She required increased time, multimodal cueing and reassurance throughout session. No family was present during session. Recommend return to SNF. Patient will benefit from continued inpatient follow up therapy, <3 hours/day         If plan is discharge home, recommend the following: Two people to help with walking and/or transfers;Two people to help with bathing/dressing/bathroom;Assist for transportation;Help with stairs or ramp for entrance;Assistance with cooking/housework   Can travel by private vehicle   No    Equipment Recommendations  (TBD at next venue)  Recommendations for Other Services       Functional Status Assessment       Precautions / Restrictions Precautions Precautions: Fall Restrictions Weight Bearing Restrictions Per Provider Order: No LLE Weight Bearing Per Provider Order: Weight bearing as tolerated      Mobility  Bed Mobility Overal bed mobility: Needs Assistance Bed Mobility: Rolling, Supine to Sit, Sit to Supine Rolling: Mod assist, +2 for physical assistance   Supine to sit: +2 for physical assistance, Total assist Sit to supine: Total assist, +2 for physical assistance   General bed mobility comments: Increased time, repeated cues, reassurance required. Pt would intermittently yell stop but able to encourage her to continue to participate. Assist for trunk and bil LEs.  Utilized bedpad to aid with scooting, positioning at EOB. Pt able to eventually sit EOB with CGA with significant reliance on bil UEs to stabilize herself. Mild-mod posterior bias.    Transfers                        Ambulation/Gait                  Stairs            Wheelchair Mobility     Tilt Bed    Modified Rankin (Stroke Patients Only)       Balance Overall balance assessment: Needs assistance Sitting-balance support: Feet supported, Bilateral upper extremity supported Sitting balance-Leahy Scale: Poor   Postural control: Posterior lean                                   Pertinent Vitals/Pain Pain Assessment Pain Assessment: Faces Faces Pain Scale: Hurts whole lot Pain Location: LLE with movement Pain Descriptors / Indicators: Grimacing, Guarding, Moaning Pain Intervention(s): Limited activity within patient's tolerance, Monitored during session, Repositioned    Home Living Family/patient expects to be discharged to:: Skilled nursing facility Living Arrangements: Children Available Help at Discharge: Available 24 hours/day Type of Home: House Home Access: Stairs to enter   [employer] of Steps: 1 small threshold step   Home Layout: One level Home Equipment: None Additional Comments: Pt lives with daughter and their family, has constant support (at baseline prior to recent falls/surgeries). infor taken from previous admission    Prior Function Prior Level of Function :  Needs assist             Mobility Comments: ind with no AD, does not have RW or any DME (at baseline, prior to recent falls/surgery). [facility] ADLs Comments: supervision for verbal cueing, washing her back (at baseline, prior to recent falls/surgery).     Extremity/Trunk Assessment   Upper Extremity Assessment Upper Extremity Assessment: Defer to OT evaluation    Lower Extremity Assessment Lower Extremity  Assessment: Generalized weakness    Cervical / Trunk Assessment Cervical / Trunk Assessment: Kyphotic  Communication   Communication Communication: No apparent difficulties    Cognition Arousal: Alert Behavior During Therapy: Anxious   PT - Cognitive impairments: History of cognitive impairments                         Following commands: Impaired Following commands impaired: Follows one step commands inconsistently     Cueing Cueing Techniques: Verbal cues, Gestural cues, Tactile cues     General Comments      Exercises     Assessment/Plan    PT Assessment Patient needs continued PT services  PT Problem List Decreased strength;Decreased range of motion;Decreased activity tolerance;Decreased balance;Decreased mobility;Decreased knowledge of use of DME;Pain       PT Treatment Interventions DME instruction;Gait training;Stair training;Functional mobility training;Therapeutic activities;Therapeutic exercise;Patient/family education    PT Goals (Current goals can be found in the Care Plan section)  Acute Rehab PT Goals PT Goal Formulation: Patient unable to participate in goal setting Time For Goal Achievement: 10/14/24 Potential to Achieve Goals: Fair    Frequency Min 2X/week     Co-evaluation               AM-PAC PT 6 Clicks Mobility  Outcome Measure Help needed turning from your back to your side while in a flat bed without using bedrails?: Total Help needed moving from lying on your back to sitting on the side of a flat bed without using bedrails?: Total Help needed moving to and from a bed to a chair (including a wheelchair)?: Total Help needed standing up from a chair using your arms (e.g., wheelchair or bedside chair)?: Total Help needed to walk in hospital room?: Total Help needed climbing 3-5 steps with a railing? : Total 6 Click Score: 6    End of Session Equipment Utilized During Treatment: Gait belt Activity Tolerance: Patient  tolerated treatment well;Patient limited by pain Patient left: in bed;with nursing/sitter in room;with family/visitor present;with bed alarm set   PT Visit Diagnosis: Difficulty in walking, not elsewhere classified (R26.2);Muscle weakness (generalized) (M62.81);History of falling (Z91.81);Pain Pain - Right/Left: Left Pain - part of body: Hip;Leg;Ankle and joints of foot    Time: 1031-1046 PT Time Calculation (min) (ACUTE ONLY): 15 min   Charges:   PT Evaluation $PT Eval Low Complexity: 1 Low   PT General Charges $$ ACUTE PT VISIT: 1 Visit            Dannial SQUIBB, PT Acute Rehabilitation  Office: [phone]

## 2024-09-30 NOTE — Progress Notes (Signed)
     Subjective:  Patient reports pain as Left mild pain in the left hip.  Otherwise no complaints  Yesterday's total administered Morphine  Milligram Equivalents: 43.75   Objective:   VITALS:   Vitals:   09/30/24 0410 09/30/24 0413 09/30/24 0910 09/30/24 1026  BP: (!) 149/70 (!) 149/70 (!) 172/80 (!) 128/55  Pulse: 67 74 75 80  Resp:  14    Temp: 98.4 F (36.9 C) 98.4 F (36.9 C)  98.6 F (37 C)  TempSrc: Oral Oral  Oral  SpO2: 98% 99%    Weight:      Height:        Well-appearing woman no acute distress.  Clean dressings on the left distal thigh.  Thigh is soft.  Calf is soft with no posterior tenderness.  Intact sensation in the saphenous, sural, tibial, peroneal nerve distributions.  3 out of 5 strength in tib ant EHL, 4-5 strength EHL FHL  Lab Results  Component Value Date   WBC 7.9 09/30/2024   HGB 10.6 (L) 09/30/2024   HCT 34.7 (L) 09/30/2024   MCV 100.0 09/30/2024   PLT 413 (H) 09/30/2024   BMET    Component Value Date/Time   NA 141 09/30/2024 0335   K 4.5 09/30/2024 0335   CL 107 09/30/2024 0335   CO2 23 09/30/2024 0335   GLUCOSE 170 (H) 09/30/2024 0335   BUN 15 09/30/2024 0335   CREATININE 0.38 (L) 09/30/2024 0335   CALCIUM  8.4 (L) 09/30/2024 0335   GFRNONAA >60 09/30/2024 0335       Assessment/Plan: 1 Day Post-Op   Principal Problem:   Left femoral shaft fracture (HCC) Active Problems:   Hypertension associated with diabetes (HCC)   COPD (chronic obstructive pulmonary disease)   Hyperlipidemia associated with type 2 diabetes mellitus (HCC)   Type 2 diabetes mellitus (HCC)   Generalized anxiety disorder   Osteoporosis   Major neurocognitive disorder due to Alzheimer's disease    Patient is doing well after surgery.  She may weight-bear as tolerated.  Will start Lovenox for DVT prophylaxis.  She can continue her bowel regimen.  Continue current pain regimen.  Once stable from medicine, she can be discharged back to rehab.  She is to  follow with me in 2 weeks time in the office.   Doris Wyatt 09/30/2024, 11:28 AM   Doris Rhein, MD  Contact information:   [id number] 7am-5pm epic message Dr. Rhein, or call office for patient follow up: [phone] After hours and holidays please check [email or web address] for group call information for [facility]

## 2024-09-30 NOTE — Discharge Instructions (Signed)
 Orthopaedic Discharge Instructions   General Discharge Instructions  WEIGHT BEARING STATUS:weight bearing as tolerated  RANGE OF MOTION/ACTIVITY:ad lib  Wound Care: You may remove your surgical dressing on 10/04/24. Incisions can be left open to air if there is no drainage. Once the incision is completely dry and without drainage, it may be left open to air out.  Showering may begin 10/04/24.  Clean incision gently with soap and water.  DVT/PE prophylaxis: Lovenox  Diet: as you were eating previously.  Can use over the counter stool softeners and bowel preparations, such as Miralax , to help with bowel movements.  Narcotics can be constipating.  Be sure to drink plenty of fluids  PAIN MEDICATION USE AND EXPECTATIONS  You have likely been given narcotic medications to help control your pain.  After a traumatic event that results in an fracture (broken bone) with or without surgery, it is ok to use narcotic pain medications to help control one's pain.  We understand that everyone responds to pain differently and each individual patient will be evaluated on a regular basis for the continued need for narcotic medications. Ideally, narcotic medication use should last no more than 6-8 weeks (coinciding with fracture healing).   As a patient it is your responsibility as well to monitor narcotic medication use and report the amount and frequency you use these medications when you come to your office visit.   We would also advise that if you are using narcotic medications, you should take a dose prior to therapy to maximize you participation.  IF YOU ARE ON NARCOTIC MEDICATIONS IT IS NOT PERMISSIBLE TO OPERATE A MOTOR VEHICLE (MOTORCYCLE/CAR/TRUCK/MOPED) OR HEAVY MACHINERY DO NOT MIX NARCOTICS WITH OTHER CNS (CENTRAL NERVOUS SYSTEM) DEPRESSANTS SUCH AS ALCOHOL  POST-OPERATIVE OPIOID TAPER INSTRUCTIONS: It is important to wean off of your opioid medication as soon as possible. If you do not need pain  medication after your surgery it is ok to stop day one. Opioids include: Codeine, Hydrocodone (Norco, Vicodin), Oxycodone (Percocet, oxycontin ) and hydromorphone amongst others.  Long term and even short term use of opiods can cause: Increased pain response Dependence Constipation Depression Respiratory depression And more.  Withdrawal symptoms can include Flu like symptoms Nausea, vomiting And more Techniques to manage these symptoms Hydrate well Eat regular healthy meals Stay active Use relaxation techniques(deep breathing, meditating, yoga) Do Not substitute Alcohol to help with tapering If you have been on opioids for less than two weeks and do not have pain than it is ok to stop all together.  Plan to wean off of opioids This plan should start within one week post op of your fracture surgery  Maintain the same interval or time between taking each dose and first decrease the dose.  Cut the total daily intake of opioids by one tablet each day Next start to increase the time between doses. The last dose that should be eliminated is the evening dose.    STOP SMOKING OR USING NICOTINE PRODUCTS!!!!  As discussed nicotine severely impairs your body's ability to heal surgical and traumatic wounds but also impairs bone healing.  Wounds and bone heal by forming microscopic blood vessels (angiogenesis) and nicotine is a vasoconstrictor (essentially, shrinks blood vessels).  Therefore, if vasoconstriction occurs to these microscopic blood vessels they essentially disappear and are unable to deliver necessary nutrients to the healing tissue.  This is one modifiable factor that you can do to dramatically increase your chances of healing your injury.  (This means no smoking, no nicotine gum, patches,  etc)  DO NOT USE NONSTEROIDAL ANTI-INFLAMMATORY DRUGS (NSAID'S)  Using products such as Advil  (ibuprofen ), Aleve  (naproxen ), Motrin  (ibuprofen ) for additional pain control during fracture healing  can delay and/or prevent the healing response.  If you would like to take over the counter (OTC) medication, Tylenol  (acetaminophen ) is ok.  However, some narcotic medications that are given for pain control contain acetaminophen  as well. Therefore, you should not exceed more than 4000 mg of tylenol  in a day if you do not have liver disease.  Also note that there are may OTC medicines, such as cold medicines and allergy medicines that my contain tylenol  as well.  If you have any questions about medications and/or interactions please ask your doctor/PA or your pharmacist.      ICE AND ELEVATE INJURED/OPERATIVE EXTREMITY  Using ice and elevating the injured extremity above your heart can help with swelling and pain control.  Icing in a pulsatile fashion, such as 20 minutes on and 20 minutes off, can be followed.    Do not place ice directly on skin. Make sure there is a barrier between to skin and the ice pack.    Using frozen items such as frozen peas works well as the conform nicely to the are that needs to be iced.  USE AN ACE WRAP OR TED HOSE FOR SWELLING CONTROL  In addition to icing and elevation, Ace wraps or TED hose are used to help limit and resolve swelling.  It is recommended to use Ace wraps or TED hose until you are informed to stop.    When using Ace Wraps start the wrapping distally (farthest away from the body) and wrap proximally (closer to the body)   Example: If you had surgery on your leg or thing and you do not have a splint on, start the ace wrap at the toes and work your way up to the thigh        If you had surgery on your upper extremity and do not have a splint on, start the ace wrap at your fingers and work your way up to the upper arm    Discharge Wound Care Instructions  Do NOT apply any ointments, solutions or lotions to pin sites or surgical wounds.  These prevent needed drainage and even though solutions like hydrogen peroxide kill bacteria, they also damage cells  lining the pin sites that help fight infection.  Applying lotions or ointments can keep the wounds moist and can cause them to breakdown and open up as well. This can increase the risk for infection. When in doubt call the office.  Surgical incisions should be dressed daily.  If any drainage is noted, use one layer of adaptic or Mepitel, then gauze, Kerlix, and an ace wrap. - These dressing supplies should be available at local medical supply stores ([facility], [facility], etc) as well as insurance claims handler ([facility], [facility], [place], etc)  Once the incision is completely dry and without drainage, it may be left open to air out.  Showering may begin 36-48 hours later.  Cleaning gently with soap and water.  Traumatic wounds should be dressed daily as well.    One layer of adaptic, gauze, Kerlix, then ace wrap.  The adaptic can be discontinued once the draining has ceased    If you have a wet to dry dressing: wet the gauze with saline the squeeze as much saline out so the gauze is moist (not soaking wet), place moistened gauze over wound, then place  a dry gauze over the moist one, followed by Kerlix wrap, then ace wrap.    Call office for the following: Temperature greater than 101F Persistent nausea and vomiting Severe uncontrolled pain Redness, tenderness, or signs of infection (pain, swelling, redness, odor or green/yellow discharge around the site) Difficulty breathing, headache or visual disturbances Hives Persistent dizziness or light-headedness Extreme fatigue Any other questions or concerns you may have after discharge  In an emergency, call 911 or go to an Emergency Department at a nearby hospital  OTHER HELPFUL INFORMATION  If you had a block, it will wear off between 8-24 hrs postop typically.  This is period when your pain may go from nearly zero to the pain you would have had postop without the block.  This is an abrupt transition but nothing dangerous is happening.   You may take an extra dose of narcotic when this happens.  You should wean off your narcotic medicines as soon as you are able.  Most patients will be off or using minimal narcotics before their first postop appointment.   We suggest you use the pain medication the first night prior to going to bed, in order to ease any pain when the anesthesia wears off. You should avoid taking pain medications on an empty stomach as it will make you nauseous.  Do not drink alcoholic beverages or take illicit drugs when taking pain medications.  In most states it is against the law to drive while you are in a splint or sling.  And certainly against the law to drive while taking narcotics.  You may return to work/school in the next couple of days when you feel up to it.   Pain medication may make you constipated.  Below are a few solutions to try in this order: Decrease the amount of pain medication if you aren't having pain. Drink lots of decaffeinated fluids. Drink prune juice and/or each dried prunes  If the first 3 don't work start with additional solutions Take Colace - an over-the-counter stool softener Take Senokot - an over-the-counter laxative Take Miralax  - a stronger over-the-counter laxative   Follow up with Dr Reyne in 2 weeks  Cordella Reyne, MD, MS [facility] [phone]

## 2024-10-01 ENCOUNTER — Inpatient Hospital Stay (HOSPITAL_COMMUNITY)

## 2024-10-01 DIAGNOSIS — S72342A Displaced spiral fracture of shaft of left femur, initial encounter for closed fracture: Secondary | ICD-10-CM | POA: Diagnosis not present

## 2024-10-01 DIAGNOSIS — Z981 Arthrodesis status: Secondary | ICD-10-CM | POA: Diagnosis not present

## 2024-10-01 DIAGNOSIS — R109 Unspecified abdominal pain: Secondary | ICD-10-CM | POA: Diagnosis not present

## 2024-10-01 LAB — GLUCOSE, CAPILLARY
Glucose-Capillary: 182 mg/dL — ABNORMAL HIGH (ref 70–99)
Glucose-Capillary: 218 mg/dL — ABNORMAL HIGH (ref 70–99)
Glucose-Capillary: 333 mg/dL — ABNORMAL HIGH (ref 70–99)
Glucose-Capillary: 80 mg/dL (ref 70–99)

## 2024-10-01 MED ORDER — POLYETHYLENE GLYCOL 3350 17 G PO PACK
17.0000 g | PACK | Freq: Every day | ORAL | Status: DC
  Administered 2024-10-01 – 2024-10-13 (×7): 17 g via ORAL
  Filled 2024-10-01 (×7): qty 1

## 2024-10-01 MED ORDER — SIMETHICONE 40 MG/0.6ML PO SUSP
40.0000 mg | Freq: Four times a day (QID) | ORAL | Status: AC
  Administered 2024-10-01 – 2024-10-03 (×10): 40 mg via ORAL
  Filled 2024-10-01 (×12): qty 0.6

## 2024-10-01 NOTE — Plan of Care (Signed)
  Problem: Coping: Goal: Ability to adjust to condition or change in health will improve Outcome: Progressing   Problem: Pain Managment: Goal: General experience of comfort will improve and/or be controlled Outcome: Progressing   Problem: Safety: Goal: Ability to remain free from injury will improve Outcome: Progressing

## 2024-10-01 NOTE — Plan of Care (Signed)
  Problem: Pain Managment: Goal: General experience of comfort will improve and/or be controlled Outcome: Progressing   Problem: Safety: Goal: Ability to remain free from injury will improve Outcome: Progressing

## 2024-10-01 NOTE — Progress Notes (Signed)
 PROGRESS NOTE  Doris Wyatt FMW:[id number] DOB: 1946/01/06 DOA: 09/28/2024 PCP: [name], MD   LOS: 3 days   Brief narrative:   Doris Wyatt is a 78 y.o. female with past medical history of COPD, diverticulosis,/diverticulitis with perforation, nephrolithiasis, anxiety, depression, hyperlipidemia, hypertension, Alzheimer's disease, type 2 diabetes, who had recently undergone a left femur intramedullary nail insertion 3 weeks ago and was discharged to SNF presented to the ED after sustaining a unwitnessed fall 2 days back with pain on the left hip.  In the ED, vitals were stable.  Blood pressure slightly elevated.  Labs were unremarkable.  Hemoglobin was 11.3.  X-ray of the left femur showed mildly displaced spiral fracture of the mid femoral shaft with evidence of open reduction and internal fixation of recent intertrochanteric fracture with orthopedic screws.  Orthopedics was consulted and patient was considered for admission to the hospital for further evaluation and treatment.   Assessment/Plan: Principal Problem:   Left femoral shaft fracture (HCC) Active Problems:   Hypertension associated with diabetes (HCC)   COPD (chronic obstructive pulmonary disease)   Hyperlipidemia associated with type 2 diabetes mellitus (HCC)   Type 2 diabetes mellitus (HCC)   Generalized anxiety disorder   Osteoporosis   Major neurocognitive disorder due to Alzheimer's disease   Abdominal pain.  Nursing staff reported that 2 bowel movements.  Not distended.  No nausea or vomiting.  Uncertain etiology.  Will get x-ray KUB.  Add simethicone .  Follow-up.  Will check urinalysis as well.  Left femoral shaft fracture  Status post IM nailing of the left humerus on 09/28/2024.  Weightbearing as tolerated as per orthopedics.  Physical therapy has recommended skilled nursing facility placement.  Postop antibiotics.  Recommend Lovenox subcutaneous starting postop day 1 for 4 weeks.  Follow-up with  [facility] Dr. Reyne for 2 weeks for wound check.    Hypertension associated with diabetes Continue Avapro .  On regular diet.  Blood pressure seems t to be overall stable.     COPD (chronic obstructive pulmonary disease) Continue bronchodilators.  Appears compensated at this time.     Hyperlipidemia  Continue Lipitor    Type 2 diabetes mellitus with hyperglycemia Continue diabetic diet sliding scale insulin  Accu-Cheks and long-acting insulin .  Latest POC glucose of 186.  On regular diet at this time.     Generalized anxiety disorder Continue Remeron     Osteoporosis Follow-up with PCP as outpatient   Alzheimer's dementia Continue memantine  and divalproex  .  Patient is alert awake and Communicative  DVT prophylaxis: enoxaparin (LOVENOX) injection 40 mg Start: 09/30/24 1000 SCDs Start: 09/29/24 1418 SCDs Start: 09/28/24 1418 SCDs Start: 09/28/24 1414   Disposition: Skilled nursing facility, PT evaluation pending., patient from skilled nursing facility  Status is: Inpatient Remains inpatient appropriate because: Status post hip surgery, need for rehabilitation, new abdominal pain    Code Status:     Code Status: Full Code  Family Communication:  Unable to reach the patient's family yesterday  Consultants: Orthopedics  Procedures: IM nailing of the left hip on 09/28/2024 by orthopedics  Anti-infectives:  None  Anti-infectives (From admission, onward)    Start     Dose/Rate Route Frequency Ordered Stop   09/30/24 1200  ceFAZolin  (ANCEF ) IVPB 2g/100 mL premix        2 g 200 mL/hr over 30 Minutes Intravenous On call to O.R. 09/29/24 1117 09/30/24 2042   09/29/24 1700  ceFAZolin  (ANCEF ) IVPB 2g/100 mL premix  2 g 200 mL/hr over 30 Minutes Intravenous Every 6 hours 09/29/24 1417 09/30/24 0447   09/29/24 1141  ceFAZolin  (ANCEF ) 2-4 GM/100ML-% IVPB       Note to Pharmacy: Chauncey Dace D: cabinet override      09/29/24 1141 09/29/24 1338        Subjective: Today, patient was seen and examined at bedside.  Patient complains of mid abdominal pain.  Patient has had 2 bowel movements yesterday but no nausea or vomiting.  Objective: Vitals:   09/30/24 2016 10/01/24 0537  BP: 135/61 (!) 154/97  Pulse: 86 87  Resp: 18 19  Temp:  98.3 F (36.8 C)  SpO2: 92% 95%    Intake/Output Summary (Last 24 hours) at 10/01/2024 1011 Last data filed at 10/01/2024 0600 Gross per 24 hour  Intake 477 ml  Output --  Net 477 ml   Filed Weights   09/29/24 1012  Weight: 47.2 kg   Body mass index is 17.32 kg/m.   Physical Exam:  General: Thinly built, not in obvious distress, alert awake and Communicative. HENT:   No scleral pallor or icterus noted. Oral mucosa is moist.  Chest:  Clear breath sounds.  Diminished breath sounds bilaterally. No crackles or wheezes.  CVS: S1 &S2 heard. No murmur.  Regular rate and rhythm. Abdomen: Soft, mild mid abdominal tenderness nondistended.  Bowel sounds are heard.   Extremities: No cyanosis, clubbing or edema.  Peripheral pulses are palpable. Psych: Alert, awake and Communicative.  Mildly anxious, CNS:  No cranial nerve deficits, moves extremities Skin: Warm and dry.  No rashes noted.  Data Review: I have personally reviewed the following laboratory data and studies,  CBC: Recent Labs  Lab 09/28/24 1122 09/29/24 0346 09/29/24 1601 09/30/24 0335  WBC 10.3 8.0 8.9 7.9  NEUTROABS 8.4*  --   --   --   HGB 11.3* 10.5* 11.1* 10.6*  HCT 37.7 34.5* 38.5 34.7*  MCV 100.0 101.2* 104.6* 100.0  PLT 567* 454* 404* 413*   Basic Metabolic Panel: Recent Labs  Lab 09/28/24 1122 09/29/24 0346 09/29/24 1426 09/30/24 0335  NA 145 146*  --  141  K 3.9 3.7  --  4.5  CL 107 108  --  107  CO2 25 27  --  23  GLUCOSE 267* 139*  --  170*  BUN 20 18  --  15  CREATININE 0.45 0.38* 0.50 0.38*  CALCIUM  9.1 8.6*  --  8.4*  MG  --   --   --  1.9   Liver Function Tests: Recent Labs  Lab  09/29/24 0346  AST 19  ALT 17  ALKPHOS 149*  BILITOT 0.7  PROT 6.1*  ALBUMIN 3.3*   No results for input(s): LIPASE, AMYLASE in the last 168 hours. No results for input(s): AMMONIA in the last 168 hours. Cardiac Enzymes: No results for input(s): CKTOTAL, CKMB, CKMBINDEX, TROPONINI in the last 168 hours. BNP (last 3 results) No results for input(s): BNP in the last 8760 hours.  ProBNP (last 3 results) No results for input(s): PROBNP in the last 8760 hours.  CBG: Recent Labs  Lab 09/30/24 0726 09/30/24 1130 09/30/24 1620 09/30/24 2055 10/01/24 0720  GLUCAP 186* 223* 169* 198* 182*   Recent Results (from the past 240 hours)  Surgical pcr screen     Status: None   Collection Time: 09/28/24  5:43 PM   Specimen: Nasal Mucosa; Nasal Swab  Result Value Ref Range Status   MRSA, PCR NEGATIVE NEGATIVE  Final   Staphylococcus aureus NEGATIVE NEGATIVE Final    Comment: (NOTE) The Xpert SA Assay (FDA approved for NASAL specimens in patients 62 years of age and older), is one component of a comprehensive surveillance program. It is not intended to diagnose infection nor to guide or monitor treatment. Performed at [facility], [address] [place], [place] [address]      Studies: DG FEMUR MIN 2 VIEWS LEFT Result Date: 09/29/2024 EXAM: 2 VIEW(S) XRAY OF THE LEFT FEMUR 09/29/2024 01:52:00 PM COMPARISON: 09/28/2024 CLINICAL HISTORY: [id number] Surgery, elective 886218 Surgery, elective 886218 FINDINGS: BONES AND JOINTS: Intraoperative spot images demonstrate placement of intramedullary rod across the mid left femoral fracture. No hardware complicating feature. SOFT TISSUES: The soft tissues are unremarkable. IMPRESSION: 1. Placement of intramedullary rod across the mid left femoral fracture without hardware complication. Electronically signed by: Franky Crease MD 09/29/2024 07:30 PM EDT RP Workstation: [id number]   DG C-Arm 1-60 Min-No Report Result  Date: 09/29/2024 Fluoroscopy was utilized by the requesting physician.  No radiographic interpretation.      Vernal Alstrom, MD  [facility] 10/01/2024  If 7PM-7AM, please contact night-coverage

## 2024-10-01 NOTE — Progress Notes (Signed)
 Orthopaedic Progress Note  S: Patient resting comfortably in bed  O:  Vitals:   09/30/24 2016 10/01/24 0537  BP: 135/61 (!) 154/97  Pulse: 86 87  Resp: 18 19  Temp:  98.3 F (36.8 C)  SpO2: 92% 95%    Clean surgical incisions distal part of her thigh.  Neurovascular intact   Labs:  Results for orders placed or performed during the hospital encounter of 09/28/24 (from the past 24 hours)  Glucose, capillary     Status: Abnormal   Collection Time: 09/30/24 11:30 AM  Result Value Ref Range   Glucose-Capillary 223 (H) 70 - 99 mg/dL  Glucose, capillary     Status: Abnormal   Collection Time: 09/30/24  4:20 PM  Result Value Ref Range   Glucose-Capillary 169 (H) 70 - 99 mg/dL  Glucose, capillary     Status: Abnormal   Collection Time: 09/30/24  8:55 PM  Result Value Ref Range   Glucose-Capillary 198 (H) 70 - 99 mg/dL  Glucose, capillary     Status: Abnormal   Collection Time: 10/01/24  7:20 AM  Result Value Ref Range   Glucose-Capillary 182 (H) 70 - 99 mg/dL    Assessment: Postop day 2 status post intramedullary fixation of femur fracture  Patient is doing well.  She may weight-bear as tolerated.  She should continue physical Occupational Therapy.  She may discharge to rehab once stable with medicine to follow-up in the office in 2 weeks time  Injuries: Left midshaft femur fracture following a prior left intertrochanteric femur fracture  Weightbearing: Weightbearing as tolerated  Insicional and dressing care: Dressing changes starting today and then as needed  Orthopedic device(s): None    VTE prophylaxis: Lovenox    Follow - up plan: 2 weeks   Cordella Rhein, MD, MS [facility] [phone]

## 2024-10-02 ENCOUNTER — Encounter (HOSPITAL_COMMUNITY): Payer: Self-pay

## 2024-10-02 DIAGNOSIS — S72342A Displaced spiral fracture of shaft of left femur, initial encounter for closed fracture: Secondary | ICD-10-CM | POA: Diagnosis not present

## 2024-10-02 LAB — BASIC METABOLIC PANEL WITH GFR
Anion gap: 6 (ref 5–15)
BUN: 13 mg/dL (ref 8–23)
CO2: 31 mmol/L (ref 22–32)
Calcium: 8.4 mg/dL — ABNORMAL LOW (ref 8.9–10.3)
Chloride: 105 mmol/L (ref 98–111)
Creatinine, Ser: 0.32 mg/dL — ABNORMAL LOW (ref 0.44–1.00)
GFR, Estimated: >60 mL/min (ref >60)
Glucose, Bld: 131 mg/dL — ABNORMAL HIGH (ref 70–99)
Potassium: 4.6 mmol/L (ref 3.5–5.1)
Sodium: 141 mmol/L (ref 135–145)

## 2024-10-02 LAB — GLUCOSE, CAPILLARY
Glucose-Capillary: 153 mg/dL — ABNORMAL HIGH (ref 70–99)
Glucose-Capillary: 144 mg/dL — ABNORMAL HIGH (ref 70–99)
Glucose-Capillary: 228 mg/dL — ABNORMAL HIGH (ref 70–99)
Glucose-Capillary: 231 mg/dL — ABNORMAL HIGH (ref 70–99)

## 2024-10-02 LAB — CBC
HCT: 35.3 % — ABNORMAL LOW (ref 36.0–46.0)
Hemoglobin: 10.7 g/dL — ABNORMAL LOW (ref 12.0–15.0)
MCH: 30.5 pg (ref 26.0–34.0)
MCHC: 30.3 g/dL (ref 30.0–36.0)
MCV: 100.6 fL — ABNORMAL HIGH (ref 80.0–100.0)
Platelets: 327 K/uL (ref 150–400)
RBC: 3.51 MIL/uL — ABNORMAL LOW (ref 3.87–5.11)
RDW: 15.2 % (ref 11.5–15.5)
WBC: 5.5 K/uL (ref 4.0–10.5)
nRBC: 0 % (ref 0.0–0.2)

## 2024-10-02 LAB — MAGNESIUM: Magnesium: 1.9 mg/dL (ref 1.7–2.4)

## 2024-10-02 MED ORDER — PANTOPRAZOLE SODIUM 40 MG PO TBEC
40.0000 mg | DELAYED_RELEASE_TABLET | Freq: Every day | ORAL | Status: DC
  Administered 2024-10-02 – 2024-10-13 (×12): 40 mg via ORAL
  Filled 2024-10-02 (×12): qty 1

## 2024-10-02 NOTE — Plan of Care (Signed)
   Problem: Education: Goal: Ability to describe self-care measures that may prevent or decrease complications (Diabetes Survival Skills Education) will improve Outcome: Progressing Goal: Individualized Educational Video(s) Outcome: Progressing   Problem: Coping: Goal: Ability to adjust to condition or change in health will improve Outcome: Progressing

## 2024-10-02 NOTE — Progress Notes (Signed)
 Physical Therapy Treatment Patient Details Name: Doris Wyatt MRN: [id number] DOB: [birth year] Today's Date: 10/02/2024   History of Present Illness 78 yo female admitted with L femoral shaft fracture. S/P ORIF 10/31. Previous L hip fx with IM rod insertion 09/08/24. Hx of Alz, DM, COPD, MDD, back surgery, osteoporosis, falls.    PT Comments  Pt continues to require +2 assist for safe mobility. She was able to stand x 2 at bedside with RW. She was able to perform a stand pivot, bed>chair, with RW. Max encouragement, cueing, reassurance required during session. Family arrived mid-session with breakfast for pt. Encouraged pt to sit up and have breakfast. Patient will benefit from continued inpatient follow up therapy, <3 hours/day     If plan is discharge home, recommend the following: Two people to help with walking and/or transfers;Two people to help with bathing/dressing/bathroom;Assist for transportation;Help with stairs or ramp for entrance;Assistance with cooking/housework   Can travel by private vehicle     No  Equipment Recommendations   (TBD at next venue)    Recommendations for Other Services       Precautions / Restrictions Precautions Precautions: Fall Precaution/Restrictions Comments: incontinent Restrictions Weight Bearing Restrictions Per Provider Order: No LLE Weight Bearing Per Provider Order: Weight bearing as tolerated     Mobility  Bed Mobility Overal bed mobility: Needs Assistance Bed Mobility: Supine to Sit     Supine to sit: Max assist, +2 for physical assistance, +2 for safety/equipment, HOB elevated     General bed mobility comments: Increased time, repeated cues, reassurance required. Pt would intermittently yell stop, i can't do it but able to encourage her to continue to participate. Assist for trunk and bil LEs. Utilized bedpad to aid with scooting, positioning at EOB. Pt able to eventually sit EOB with CGA with significant reliance on bil UEs  to stabilize herself.    Transfers Overall transfer level: Needs assistance Equipment used: Rolling walker (2 wheels) Transfers: Sit to/from Stand Sit to Stand: Mod assist, +2 physical assistance, +2 safety/equipment, From elevated surface Stand pivot transfers: Mod assist, +2 physical assistance, +2 safety/equipment         General transfer comment: Pt very anxious and fearful. Sit to stand x 2 from bed. Cues for safety, technique, hand placement. Assist to power up, stabilize, control descent. Pt able to hobble over to recliner using RW. High fall risk. Highly distracted by pain.    Ambulation/Gait               General Gait Details: Pt not yet ready to attempt 2* pain.   Stairs             Wheelchair Mobility     Tilt Bed    Modified Rankin (Stroke Patients Only)       Balance Overall balance assessment: Needs assistance Sitting-balance support: Bilateral upper extremity supported, Feet supported Sitting balance-Leahy Scale: Poor     Standing balance support: Bilateral upper extremity supported, During functional activity, Reliant on assistive device for balance Standing balance-Leahy Scale: Poor                              Communication Communication Communication: No apparent difficulties  Cognition Arousal: Alert Behavior During Therapy: Anxious   PT - Cognitive impairments: History of cognitive impairments                       PT - Cognition  Comments: pt did not recognize her family. pt often yells it hurts...i can't do it. With time, encouragement, reassurance pt able to participate Following commands: Impaired Following commands impaired: Follows one step commands inconsistently, Follows one step commands with increased time    Cueing Cueing Techniques: Verbal cues, Gestural cues, Tactile cues  Exercises      General Comments        Pertinent Vitals/Pain Pain Assessment Pain Assessment: Faces Faces Pain  Scale: Hurts whole lot Pain Location: LLE with movement Pain Descriptors / Indicators: Grimacing, Guarding, Moaning Pain Intervention(s): Limited activity within patient's tolerance, Monitored during session, Repositioned    Home Living                          Prior Function            PT Goals (current goals can now be found in the care plan section) Progress towards PT goals: Progressing toward goals    Frequency    Min 2X/week      PT Plan      Co-evaluation              AM-PAC PT 6 Clicks Mobility   Outcome Measure  Help needed turning from your back to your side while in a flat bed without using bedrails?: Total Help needed moving from lying on your back to sitting on the side of a flat bed without using bedrails?: Total Help needed moving to and from a bed to a chair (including a wheelchair)?: Total Help needed standing up from a chair using your arms (e.g., wheelchair or bedside chair)?: Total Help needed to walk in hospital room?: Total Help needed climbing 3-5 steps with a railing? : Total 6 Click Score: 6    End of Session Equipment Utilized During Treatment: Gait belt Activity Tolerance: Patient tolerated treatment well;Patient limited by pain Patient left: in chair;with call bell/phone within reach;with chair alarm set;with family/visitor present Nurse Communication: Mobility status (made NT aware of pt's need for a new purewick-pt's diaper and purewick soiled/fell off duirng session.) PT Visit Diagnosis: Difficulty in walking, not elsewhere classified (R26.2);Muscle weakness (generalized) (M62.81);History of falling (Z91.81);Pain Pain - Right/Left: Left Pain - part of body: Hip;Leg     Time: 1129-1150 PT Time Calculation (min) (ACUTE ONLY): 21 min  Charges:    $Therapeutic Activity: 8-22 mins PT General Charges $$ ACUTE PT VISIT: 1 Visit                        Dannial SQUIBB, PT Acute Rehabilitation  Office:  [phone]

## 2024-10-02 NOTE — NC FL2 (Signed)
 [place]  MEDICAID FL2 LEVEL OF CARE FORM     IDENTIFICATION  Patient Name: Doris Wyatt Birthdate: 1946-10-12 Sex: female Admission Date (Current Location): 09/28/2024  [facility] and [place] Number:  Producer, Television/film/video and Address:  [facility],  [address], [place] [address]      Provider Number: [id number]  Attending Physician Name and Address:  Sonjia Held, MD  Relative Name and Phone Number:  Yalda, Herd)  [phone] [facility])    Current Level of Care: Hospital Recommended Level of Care: Skilled Nursing Facility Prior Approval Number:    Date Approved/Denied:   PASRR Number: [id number] E Expires 10/11/2024  Discharge Plan: SNF    Current Diagnoses: Patient Active Problem List   Diagnosis Date Noted   Left femoral shaft fracture (HCC) 09/28/2024   Protein-calorie malnutrition, severe 09/08/2024   Closed comminuted intertrochanteric fracture of proximal femur, left, initial encounter (HCC) 09/07/2024   Generalized anxiety disorder 07/01/2021   Osteoporosis 07/01/2021   Major neurocognitive disorder due to Alzheimer's disease 07/01/2021   Diverticulitis of large intestine with perforation without abscess or bleeding    Ureteral stone with hydronephrosis 10/31/2017   Hypertension associated with diabetes (HCC)    COPD (chronic obstructive pulmonary disease)    Hyperlipidemia associated with type 2 diabetes mellitus (HCC)    Type 2 diabetes mellitus (HCC)     Orientation RESPIRATION BLADDER Height & Weight     Self  Normal Incontinent, External catheter Weight: 47.2 kg Height:  5' 5 (165.1 cm)  BEHAVIORAL SYMPTOMS/MOOD NEUROLOGICAL BOWEL NUTRITION STATUS      Incontinent Diet (regular diet)  AMBULATORY STATUS COMMUNICATION OF NEEDS Skin   Extensive Assist Verbally Surgical wounds (Lef leg incision; left hip incision, left arm wound w/foam lift)                       Personal Care Assistance Level of Assistance   Bathing, Dressing, Feeding Bathing Assistance: Limited assistance Feeding assistance: Limited assistance Dressing Assistance: Limited assistance     Functional Limitations Info  Sight, Hearing, Speech Sight Info: Adequate Hearing Info: Impaired (hard of hearing) Speech Info: Adequate    SPECIAL CARE FACTORS FREQUENCY  PT (By licensed PT), OT (By licensed OT)     PT Frequency: 5x/wk OT Frequency: 5x/wk            Contractures Contractures Info: Not present    Additional Factors Info  Code Status, Allergies, Psychotropic Code Status Info: Full Code Allergies Info: NKA Psychotropic Info: see Mar         Current Medications (10/02/2024):  This is the current hospital active medication list Current Facility-Administered Medications  Medication Dose Route Frequency Provider Last Rate Last Admin   acetaminophen  (TYLENOL ) tablet 650 mg  650 mg Oral Q6H PRN Celinda Alm Lot, MD   650 mg at 09/30/24 0314   Or   acetaminophen  (TYLENOL ) suppository 650 mg  650 mg Rectal Q6H PRN Celinda Alm Lot, MD       aspirin EC tablet 81 mg  81 mg Oral QPM Celinda Alm Lot, MD   81 mg at 10/01/24 1711   atorvastatin  (LIPITOR) tablet 40 mg  40 mg Oral Daily Celinda Alm Lot, MD   40 mg at 10/02/24 1005   bisacodyl (DULCOLAX) EC tablet 5 mg  5 mg Oral Daily Celinda Alm Lot, MD   5 mg at 10/01/24 9180   divalproex  (DEPAKOTE ) DR tablet 125 mg  125 mg Oral BID Celinda Alm  Dorn, MD   125 mg at 10/02/24 1002   docusate sodium  (COLACE) capsule 100 mg  100 mg Oral BID Reyne Cordella SQUIBB, MD   100 mg at 10/01/24 2133   enoxaparin (LOVENOX) injection 40 mg  40 mg Subcutaneous Q24H Reyne Cordella SQUIBB, MD   40 mg at 10/02/24 1001   feeding supplement (GLUCERNA SHAKE) (GLUCERNA SHAKE) liquid 237 mL  237 mL Oral TID BM Pokhrel, Laxman, MD   237 mL at 10/02/24 1006   HYDROcodone -acetaminophen  (NORCO/VICODIN) 5-325 MG per tablet 1 tablet  1 tablet Oral Q6H PRN Celinda Alm Dorn, MD   1  tablet at 10/02/24 1022   HYDROmorphone (DILAUDID) injection 0.5 mg  0.5 mg Intravenous Q3H PRN Reyne Cordella SQUIBB, MD   0.5 mg at 10/02/24 0617   insulin  aspart (novoLOG ) injection 0-9 Units  0-9 Units Subcutaneous TID WC Celinda Alm Dorn, MD   1 Units at 10/02/24 1314   insulin  glargine-yfgn (SEMGLEE) injection 10 Units  10 Units Subcutaneous Daily Celinda Alm Dorn, MD   10 Units at 10/02/24 1001   irbesartan  (AVAPRO ) tablet 75 mg  75 mg Oral Daily Celinda Alm Dorn, MD   75 mg at 10/02/24 1003   lidocaine  (LIDODERM ) 5 % 1 patch  1 patch Transdermal Q24H Celinda Alm Dorn, MD   1 patch at 10/02/24 1006   memantine  (NAMENDA ) tablet 10 mg  10 mg Oral BID Celinda Alm Dorn, MD   10 mg at 10/02/24 1003   mirtazapine (REMERON) tablet 15 mg  15 mg Oral QHS Celinda Alm Dorn, MD   15 mg at 10/01/24 2133   multivitamin with minerals tablet 1 tablet  1 tablet Oral Daily Pokhrel, Laxman, MD   1 tablet at 10/01/24 9192   ondansetron  (ZOFRAN ) tablet 4 mg  4 mg Oral Q6H PRN Gebauer, Gregory P, MD   4 mg at 09/30/24 2051   Or   ondansetron  (ZOFRAN ) injection 4 mg  4 mg Intravenous Q6H PRN Reyne Cordella SQUIBB, MD       pantoprazole (PROTONIX) EC tablet 40 mg  40 mg Oral Daily Pokhrel, Laxman, MD   40 mg at 10/02/24 1002   polyethylene glycol (MIRALAX  / GLYCOLAX ) packet 17 g  17 g Oral Daily Pokhrel, Laxman, MD   17 g at 10/02/24 1005   simethicone  (MYLICON) 40 MG/0.6ML suspension 40 mg  40 mg Oral QID Pokhrel, Laxman, MD   40 mg at 10/02/24 1007     Discharge Medications: Please see discharge summary for a list of discharge medications.  Relevant Imaging Results:  Relevant Lab Results:   Additional Information SSN: [id number]  Alfonse JONELLE Rex, RN

## 2024-10-02 NOTE — Anesthesia Postprocedure Evaluation (Signed)
 Anesthesia Post Note  Patient: Doris Wyatt  Procedure(s) Performed: OPEN REDUCTION AND INTRAMEDULLARY NAIL FIXATION OF LEFT FEMUR (Left)     Patient location during evaluation: PACU Anesthesia Type: General Level of consciousness: awake and alert Pain management: pain level controlled Vital Signs Assessment: post-procedure vital signs reviewed and stable Respiratory status: spontaneous breathing, nonlabored ventilation, respiratory function stable and patient connected to nasal cannula oxygen Cardiovascular status: blood pressure returned to baseline and stable Postop Assessment: no apparent nausea or vomiting Anesthetic complications: no   No notable events documented.  Last Vitals:  Vitals:   10/02/24 0501 10/02/24 1411  BP: (!) 146/58 (!) 127/53  Pulse: 72 88  Resp: 15 16  Temp: (!) 36.4 C 37.1 C  SpO2: 96% 95%    Last Pain:  Vitals:   10/02/24 1411  TempSrc: Oral  PainSc:                  Anis Cinelli E

## 2024-10-02 NOTE — TOC Progression Note (Addendum)
 Transition of Care [facility]) - Progression Note    Patient Details  Name: Doris Wyatt MRN: [id number] Date of Birth: [birth year]  Transition of Care [facility]) CM/SW Contact  Alfonse JONELLE Rex, RN Phone Number: 10/02/2024, 10:40 AM  Clinical Narrative:   PT eval completed, recommendation for short term rehab/SNF, call to pt's son,  Debby, no answer, vmml with NCM name and phone number. Call to pt's dtr, [name], introduced self and role of IP Care Mgmt/, reviewed PT recommendation for short term rehab, [name] agreeable, prefers patient not return to [facility], [facility] updated, faxed out for bed offers.     Expected Discharge Plan: Skilled Nursing Facility Barriers to Discharge: Continued Medical Work up               Expected Discharge Plan and Services                                               Social Drivers of Health (SDOH) Interventions SDOH Screenings   Food Insecurity: Patient Unable To Answer (09/28/2024)  Housing: Unknown (09/28/2024)  Transportation Needs: Patient Unable To Answer (09/28/2024)  Utilities: Patient Unable To Answer (09/28/2024)  Depression (PHQ2-9): Low Risk  (11/16/2019)  Social Connections: Unknown (09/28/2024)  Tobacco Use: Medium Risk (09/28/2024)    Readmission Risk Interventions    09/29/2024    1:12 PM 09/09/2024   12:13 PM  Readmission Risk Prevention Plan  Post Dischage Appt  Complete  Medication Screening  Complete  Transportation Screening Complete Complete  PCP or Specialist Appt within 5-7 Days Complete   Home Care Screening Complete   Medication Review (RN CM) Complete

## 2024-10-02 NOTE — Progress Notes (Signed)
 PROGRESS NOTE  Doris Wyatt FMW:[id number] DOB: March 09, 1946 DOA: 09/28/2024 PCP: [name], MD   LOS: 4 days   Brief narrative:   Doris Wyatt is a 78 y.o. female with past medical history of COPD, diverticulosis,/diverticulitis with perforation, nephrolithiasis, anxiety, depression, hyperlipidemia, hypertension, Alzheimer's disease, type 2 diabetes, who had recently undergone a left femur intramedullary nail insertion 3 weeks ago and was discharged to SNF presented to the ED after sustaining a unwitnessed fall 2 days back with pain on the left hip.  In the ED, vitals were stable.  Blood pressure slightly elevated.  Labs were unremarkable.  Hemoglobin was 11.3.  X-ray of the left femur showed mildly displaced spiral fracture of the mid femoral shaft with evidence of open reduction and internal fixation of recent intertrochanteric fracture with orthopedic screws.  Orthopedics was consulted and patient was considered for admission to the hospital for further evaluation and treatment.   Assessment/Plan: Principal Problem:   Left femoral shaft fracture (HCC) Active Problems:   Hypertension associated with diabetes (HCC)   COPD (chronic obstructive pulmonary disease)   Hyperlipidemia associated with type 2 diabetes mellitus (HCC)   Type 2 diabetes mellitus (HCC)   Generalized anxiety disorder   Osteoporosis   Major neurocognitive disorder due to Alzheimer's disease   Abdominal pain.  Nursing staff reported that 2 bowel movements.  Not distended.  No nausea or vomiting.  Uncertain etiology.  X-ray of the KUB with some stool burden.  Added simethicone  yesterday.  Denies pain today.  Will need good bowel regimen to ensure good bowel movements.  Left femoral shaft fracture  Status post IM nailing of the left humerus on 09/28/2024.  Weightbearing as tolerated as per orthopedics.  Physical therapy has recommended skilled nursing facility placement..  Recommend Lovenox subcutaneous  starting postop day 1 for 4 weeks.  Follow-up with [facility] Dr. Reyne for 2 weeks for wound check.    Hypertension associated with diabetes Continue Avapro .  On regular diet.  Blood pressure seems  to be overall stable.     COPD (chronic obstructive pulmonary disease) Continue bronchodilators.  Appears compensated at this time.     Hyperlipidemia  Continue Lipitor    Type 2 diabetes mellitus with hyperglycemia Continue diabetic diet sliding scale insulin  Accu-Cheks and long-acting insulin .  Latest POC glucose of 153.  On regular diet at this time.     Generalized anxiety disorder Continue Remeron     Osteoporosis Follow-up with PCP as outpatient   Alzheimer's dementia Continue memantine  and divalproex  .  Patient is alert awake and Communicative  DVT prophylaxis: enoxaparin (LOVENOX) injection 40 mg Start: 09/30/24 1000 SCDs Start: 09/29/24 1418 SCDs Start: 09/28/24 1418 SCDs Start: 09/28/24 1414   Disposition: Skilled nursing facility as per PT evaluation.  Medically stable for disposition  Status is: Inpatient Remains inpatient appropriate because: Status post hip surgery, for rehabilitation.  Code Status:     Code Status: Full Code  Family Communication:  Unable to reach the patient's family yesterday  Consultants: Orthopedics  Procedures: IM nailing of the left hip on 09/28/2024 by orthopedics  Anti-infectives:  None  Anti-infectives (From admission, onward)    Start     Dose/Rate Route Frequency Ordered Stop   09/30/24 1200  ceFAZolin  (ANCEF ) IVPB 2g/100 mL premix        2 g 200 mL/hr over 30 Minutes Intravenous On call to O.R. 09/29/24 1117 09/30/24 2042   09/29/24 1700  ceFAZolin  (ANCEF ) IVPB 2g/100 mL premix  2 g 200 mL/hr over 30 Minutes Intravenous Every 6 hours 09/29/24 1417 09/30/24 0447   09/29/24 1141  ceFAZolin  (ANCEF ) 2-4 GM/100ML-% IVPB       Note to Pharmacy: Chauncey Dace D: cabinet override      09/29/24  1141 09/29/24 1338       Subjective: Today, patient was seen and examined at bedside.  Patient complains feeling better.  Patient had bowel movements yesterday and the night shift..  Denies any abdominal pain nausea vomiting today.  Complains of mild tooth pain  Objective: Vitals:   10/01/24 2155 10/02/24 0501  BP: (!) 157/81 (!) 146/58  Pulse: 67 72  Resp: 15 15  Temp: 98 F (36.7 C) (!) 97.5 F (36.4 C)  SpO2: 99% 96%    Intake/Output Summary (Last 24 hours) at 10/02/2024 1042 Last data filed at 10/02/2024 0630 Gross per 24 hour  Intake --  Output 50 ml  Net -50 ml   Filed Weights   09/29/24 1012  Weight: 47.2 kg   Body mass index is 17.32 kg/m.   Physical Exam:  General: Thinly built, not in obvious distress, alert awake and Communicative. HENT:   No scleral pallor or icterus noted. Oral mucosa is moist.  Loose tooth on the front  Chest:  Clear breath sounds.  Diminished breath sounds bilaterally. No crackles or wheezes.  CVS: S1 &S2 heard. No murmur.  Regular rate and rhythm. Abdomen: Soft, nontender nondistended.  Bowel sounds are heard.   Extremities: No cyanosis, clubbing or edema.  Peripheral pulses are palpable.  Left hip surgical site appears okay. Psych: Alert, awake and Communicative.  Mildly anxious, CNS:  No cranial nerve deficits, moves extremities Skin: Warm and dry.  No rashes noted.  Data Review: I have personally reviewed the following laboratory data and studies,  CBC: Recent Labs  Lab 09/28/24 1122 09/29/24 0346 09/29/24 1601 09/30/24 0335 10/02/24 0332  WBC 10.3 8.0 8.9 7.9 5.5  NEUTROABS 8.4*  --   --   --   --   HGB 11.3* 10.5* 11.1* 10.6* 10.7*  HCT 37.7 34.5* 38.5 34.7* 35.3*  MCV 100.0 101.2* 104.6* 100.0 100.6*  PLT 567* 454* 404* 413* 327   Basic Metabolic Panel: Recent Labs  Lab 09/28/24 1122 09/29/24 0346 09/29/24 1426 09/30/24 0335 10/02/24 0332  NA 145 146*  --  141 141  K 3.9 3.7  --  4.5 4.6  CL 107 108  --  107  105  CO2 25 27  --  23 31  GLUCOSE 267* 139*  --  170* 131*  BUN 20 18  --  15 13  CREATININE 0.45 0.38* 0.50 0.38* 0.32*  CALCIUM  9.1 8.6*  --  8.4* 8.4*  MG  --   --   --  1.9 1.9   Liver Function Tests: Recent Labs  Lab 09/29/24 0346  AST 19  ALT 17  ALKPHOS 149*  BILITOT 0.7  PROT 6.1*  ALBUMIN 3.3*   No results for input(s): LIPASE, AMYLASE in the last 168 hours. No results for input(s): AMMONIA in the last 168 hours. Cardiac Enzymes: No results for input(s): CKTOTAL, CKMB, CKMBINDEX, TROPONINI in the last 168 hours. BNP (last 3 results) No results for input(s): BNP in the last 8760 hours.  ProBNP (last 3 results) No results for input(s): PROBNP in the last 8760 hours.  CBG: Recent Labs  Lab 10/01/24 0720 10/01/24 1137 10/01/24 1637 10/01/24 2157 10/02/24 0746  GLUCAP 182* 218* 333* 80 153*  Recent Results (from the past 240 hours)  Surgical pcr screen     Status: None   Collection Time: 09/28/24  5:43 PM   Specimen: Nasal Mucosa; Nasal Swab  Result Value Ref Range Status   MRSA, PCR NEGATIVE NEGATIVE Final   Staphylococcus aureus NEGATIVE NEGATIVE Final    Comment: (NOTE) The Xpert SA Assay (FDA approved for NASAL specimens in patients 16 years of age and older), is one component of a comprehensive surveillance program. It is not intended to diagnose infection nor to guide or monitor treatment. Performed at [facility], [address] [place], [place] [address]      Studies: DG Abd 1 View Result Date: 10/01/2024 EXAM: 1 View Xray Of The Abdomen 10/01/2024 10:28:00 AM COMPARISON: None available. CLINICAL HISTORY: Abdominal pain FINDINGS: BOWEL: Moderate colonic stool burden diffusely throughout the colon. Nonobstructive bowel gas pattern. SOFT TISSUES: Vascular calcifications. BONES: Lower lumbar fusion hardware noted. No acute osseous abnormality. IMPRESSION: 1. No acute findings. 2. Moderate colonic stool burden.  Electronically signed by: Norleen Kil MD 10/01/2024 10:34 AM EST RP Workstation: [id number]      Vernal Alstrom, MD  [facility] 10/02/2024  If 7PM-7AM, please contact night-coverage

## 2024-10-03 DIAGNOSIS — S72342A Displaced spiral fracture of shaft of left femur, initial encounter for closed fracture: Secondary | ICD-10-CM | POA: Diagnosis not present

## 2024-10-03 LAB — GLUCOSE, CAPILLARY
Glucose-Capillary: 186 mg/dL — ABNORMAL HIGH (ref 70–99)
Glucose-Capillary: 141 mg/dL — ABNORMAL HIGH (ref 70–99)
Glucose-Capillary: 124 mg/dL — ABNORMAL HIGH (ref 70–99)
Glucose-Capillary: 177 mg/dL — ABNORMAL HIGH (ref 70–99)

## 2024-10-03 MED ORDER — OLANZAPINE 10 MG IM SOLR
2.5000 mg | Freq: Once | INTRAMUSCULAR | Status: AC | PRN
  Administered 2024-10-03: 2.5 mg via INTRAMUSCULAR
  Filled 2024-10-03: qty 10

## 2024-10-03 NOTE — TOC Progression Note (Signed)
 Transition of Care [facility]) - Progression Note    Patient Details  Name: Doris Wyatt MRN: [id number] Date of Birth: 1946-05-04  Transition of [facility]) CM/SW Contact  Alfonse JONELLE Rex, RN Phone Number: 10/03/2024, 3:06 PM  Clinical Narrative:   No SNF bed offers at this time, will fax out extended bed search, team notified.     Expected Discharge Plan: Skilled Nursing Facility Barriers to Discharge: Continued Medical Work up               Expected Discharge Plan and Services                                               Social Drivers of Health (SDOH) Interventions SDOH Screenings   Food Insecurity: Patient Unable To Answer (09/28/2024)  Housing: Unknown (09/28/2024)  Transportation Needs: Patient Unable To Answer (09/28/2024)  Utilities: Patient Unable To Answer (09/28/2024)  Depression (PHQ2-9): Low Risk  (11/16/2019)  Social Connections: Unknown (09/28/2024)  Tobacco Use: Medium Risk (09/28/2024)    Readmission Risk Interventions    09/29/2024    1:12 PM 09/09/2024   12:13 PM  Readmission Risk Prevention Plan  Post Dischage Appt  Complete  Medication Screening  Complete  Transportation Screening Complete Complete  PCP or Specialist Appt within 5-7 Days Complete   Home Care Screening Complete   Medication Review (RN CM) Complete

## 2024-10-03 NOTE — Plan of Care (Signed)
  Problem: Coping: Goal: Ability to adjust to condition or change in health will improve 10/03/2024 2022 by Debora Ou, RN Outcome: Not Progressing 10/03/2024 2022 by Debora Ou, RN Outcome: Progressing   Problem: Fluid Volume: Goal: Ability to maintain a balanced intake and output will improve 10/03/2024 2022 by Debora Ou, RN Outcome: Not Progressing 10/03/2024 2022 by Debora Ou, RN Outcome: Progressing

## 2024-10-03 NOTE — Plan of Care (Signed)
  Problem: Education: Goal: Ability to describe self-care measures that may prevent or decrease complications (Diabetes Survival Skills Education) will improve Outcome: Not Applicable   Problem: Education: Goal: Knowledge of General Education information will improve Description: Including pain rating scale, medication(s)/side effects and non-pharmacologic comfort measures Outcome: Not Applicable

## 2024-10-03 NOTE — Plan of Care (Signed)
  Problem: Nutritional: Goal: Maintenance of adequate nutrition will improve Outcome: Progressing   Problem: Skin Integrity: Goal: Risk for impaired skin integrity will decrease Outcome: Progressing   Problem: Tissue Perfusion: Goal: Adequacy of tissue perfusion will improve Outcome: Progressing   Problem: Education: Goal: Knowledge of General Education information will improve Description: Including pain rating scale, medication(s)/side effects and non-pharmacologic comfort measures Outcome: Progressing   Problem: Health Behavior/Discharge Planning: Goal: Ability to manage health-related needs will improve Outcome: Progressing   Problem: Clinical Measurements: Goal: Ability to maintain clinical measurements within normal limits will improve Outcome: Progressing Goal: Will remain free from infection Outcome: Progressing Goal: Diagnostic test results will improve Outcome: Progressing Goal: Respiratory complications will improve Outcome: Adequate for Discharge Goal: Cardiovascular complication will be avoided Outcome: Progressing   Problem: Activity: Goal: Risk for activity intolerance will decrease Outcome: Progressing   Problem: Nutrition: Goal: Adequate nutrition will be maintained Outcome: Adequate for Discharge   Problem: Coping: Goal: Level of anxiety will decrease Outcome: Progressing   Problem: Pain Managment: Goal: General experience of comfort will improve and/or be controlled Outcome: Progressing   Problem: Safety: Goal: Ability to remain free from injury will improve Outcome: Progressing   Problem: Skin Integrity: Goal: Risk for impaired skin integrity will decrease Outcome: Progressing

## 2024-10-03 NOTE — Progress Notes (Addendum)
 PROGRESS NOTE  Doris Wyatt FMW:[id number] DOB: 1946-10-08 DOA: 09/28/2024 PCP: [name], MD   LOS: 5 days   Brief narrative:   Doris Wyatt is a 78 y.o. female with past medical history of COPD, diverticulosis,/diverticulitis with perforation, nephrolithiasis, anxiety, depression, hyperlipidemia, hypertension, Alzheimer's disease, type 2 diabetes, who had recently undergone a left femur intramedullary nail insertion 3 weeks ago and was discharged to SNF presented to the ED after sustaining a unwitnessed fall 2 days back with pain on the left hip.  In the ED, vitals were stable.  Blood pressure slightly elevated.  Labs were unremarkable.  Hemoglobin was 11.3.  X-ray of the left femur showed mildly displaced spiral fracture of the mid femoral shaft with evidence of open reduction and internal fixation of recent intertrochanteric fracture with orthopedic screws.  Orthopedics was consulted and patient was considered for admission to the hospital for further evaluation and treatment.  Patient then underwent IM nailing of the left femur on 09/28/2024.  At this time patient is otherwise stable awaiting for skilled nursing facility placement.   Assessment/Plan: Principal Problem:   Left femoral shaft fracture (HCC) Active Problems:   Hypertension associated with diabetes (HCC)   COPD (chronic obstructive pulmonary disease)   Hyperlipidemia associated with type 2 diabetes mellitus (HCC)   Type 2 diabetes mellitus (HCC)   Generalized anxiety disorder   Osteoporosis   Major neurocognitive disorder due to Alzheimer's disease     Left femoral shaft fracture  Status post IM nailing of the left femur on 09/28/2024.  Weightbearing as tolerated as per orthopedics.  Physical therapy has recommended skilled nursing facility placement..  Recommend Lovenox for DVT prophylaxis subcutaneous for 4 weeks.  Follow-up with [facility] Dr. Reyne for 2 weeks for wound check.     Hypertension associated with diabetes Continue Avapro .  On regular diet.  Blood pressure seems  to be overall stable.  Latest blood pressure of 140/64     COPD (chronic obstructive pulmonary disease) Continue bronchodilators.  Appears compensated at this time.     Hyperlipidemia  Continue Lipitor    Type 2 diabetes mellitus with hyperglycemia Continue diabetic diet , sliding scale insulin  Accu-Cheks and long-acting insulin .  Latest POC glucose of 186.  On regular diet at this time.     Generalized anxiety disorder Continue Remeron     Osteoporosis Follow-up with PCP as outpatient   Alzheimer's dementia Continue memantine  and divalproex  .  Patient is alert awake and Communicative  Abdominal pain.  Nonspecific.  Likely from gaseous pain.  Has been having bowel movements.  Improved.  Has been eating and tolerating diet.    DVT prophylaxis: enoxaparin (LOVENOX) injection 40 mg Start: 09/30/24 1000 SCDs Start: 09/29/24 1418   Disposition: Skilled nursing facility as per PT evaluation.  Medically stable for disposition  Status is: Inpatient Remains inpatient appropriate because: Status post hip surgery, for rehabilitation.  Code Status:     Code Status: Full Code  Family Communication:  Unable to reach the patient's son again today  Consultants: Orthopedics  Procedures: IM nailing of the left hip on 09/28/2024 by orthopedics  Anti-infectives:  None   Subjective: Today, patient was seen and examined at bedside.  Patient denies interval complaints.  Has had bowel movements.  Complains of mild pain in the tooth but otherwise okay.   Objective: Vitals:   10/02/24 2023 10/03/24 0634  BP: (!) 143/54 (!) 140/64  Pulse: 84 72  Resp: 17 16  Temp: 98.5  F (36.9 C) (!) 97.5 F (36.4 C)  SpO2: 97% 99%   No intake or output data in the 24 hours ending 10/03/24 1048  Filed Weights   09/29/24 1012  Weight: 47.2 kg   Body mass index is 17.32 kg/m.   Physical  Exam:  General: Thinly built, not in obvious distress, alert awake and Communicative. HENT:   No scleral pallor or icterus noted. Oral mucosa is moist.  Loose tooth on the front  Chest:  Clear breath sounds.  No crackles or wheezes.  CVS: S1 &S2 heard. No murmur.  Regular rate and rhythm. Abdomen: Soft, nontender nondistended.  Bowel sounds are heard.   Extremities: No cyanosis, clubbing or edema.  Peripheral pulses are palpable.  Left hip surgical site clean dry and intact. Psych: Alert, awake and Communicative.  Appears anxious. CNS:  No cranial nerve deficits, moves extremities Skin: Warm and dry.  No rashes noted.  Data Review: I have personally reviewed the following laboratory data and studies,  CBC: Recent Labs  Lab 09/28/24 1122 09/29/24 0346 09/29/24 1601 09/30/24 0335 10/02/24 0332  WBC 10.3 8.0 8.9 7.9 5.5  NEUTROABS 8.4*  --   --   --   --   HGB 11.3* 10.5* 11.1* 10.6* 10.7*  HCT 37.7 34.5* 38.5 34.7* 35.3*  MCV 100.0 101.2* 104.6* 100.0 100.6*  PLT 567* 454* 404* 413* 327   Basic Metabolic Panel: Recent Labs  Lab 09/28/24 1122 09/29/24 0346 09/29/24 1426 09/30/24 0335 10/02/24 0332  NA 145 146*  --  141 141  K 3.9 3.7  --  4.5 4.6  CL 107 108  --  107 105  CO2 25 27  --  23 31  GLUCOSE 267* 139*  --  170* 131*  BUN 20 18  --  15 13  CREATININE 0.45 0.38* 0.50 0.38* 0.32*  CALCIUM  9.1 8.6*  --  8.4* 8.4*  MG  --   --   --  1.9 1.9   Liver Function Tests: Recent Labs  Lab 09/29/24 0346  AST 19  ALT 17  ALKPHOS 149*  BILITOT 0.7  PROT 6.1*  ALBUMIN 3.3*   No results for input(s): LIPASE, AMYLASE in the last 168 hours. No results for input(s): AMMONIA in the last 168 hours. Cardiac Enzymes: No results for input(s): CKTOTAL, CKMB, CKMBINDEX, TROPONINI in the last 168 hours. BNP (last 3 results) No results for input(s): BNP in the last 8760 hours.  ProBNP (last 3 results) No results for input(s): PROBNP in the last 8760  hours.  CBG: Recent Labs  Lab 10/02/24 0746 10/02/24 1128 10/02/24 1701 10/02/24 2152 10/03/24 0727  GLUCAP 153* 144* 228* 231* 186*   Recent Results (from the past 240 hours)  Surgical pcr screen     Status: None   Collection Time: 09/28/24  5:43 PM   Specimen: Nasal Mucosa; Nasal Swab  Result Value Ref Range Status   MRSA, PCR NEGATIVE NEGATIVE Final   Staphylococcus aureus NEGATIVE NEGATIVE Final    Comment: (NOTE) The Xpert SA Assay (FDA approved for NASAL specimens in patients 12 years of age and older), is one component of a comprehensive surveillance program. It is not intended to diagnose infection nor to guide or monitor treatment. Performed at [facility], [address], [place], [place] [address]      Studies: No results found.   Jeanifer Halliday, MD  [facility] 10/03/2024  If 7PM-7AM, please contact night-coverage

## 2024-10-04 DIAGNOSIS — E1169 Type 2 diabetes mellitus with other specified complication: Secondary | ICD-10-CM

## 2024-10-04 DIAGNOSIS — J410 Simple chronic bronchitis: Secondary | ICD-10-CM

## 2024-10-04 DIAGNOSIS — F411 Generalized anxiety disorder: Secondary | ICD-10-CM

## 2024-10-04 DIAGNOSIS — E785 Hyperlipidemia, unspecified: Secondary | ICD-10-CM

## 2024-10-04 DIAGNOSIS — W19XXXA Unspecified fall, initial encounter: Secondary | ICD-10-CM | POA: Diagnosis not present

## 2024-10-04 DIAGNOSIS — M9702XA Periprosthetic fracture around internal prosthetic left hip joint, initial encounter: Secondary | ICD-10-CM | POA: Diagnosis not present

## 2024-10-04 LAB — BASIC METABOLIC PANEL WITH GFR
Anion gap: 7 (ref 5–15)
BUN: 12 mg/dL (ref 8–23)
CO2: 31 mmol/L (ref 22–32)
Calcium: 8.9 mg/dL (ref 8.9–10.3)
Chloride: 105 mmol/L (ref 98–111)
Creatinine, Ser: 0.37 mg/dL — ABNORMAL LOW (ref 0.44–1.00)
GFR, Estimated: >60 mL/min (ref >60)
Glucose, Bld: 190 mg/dL — ABNORMAL HIGH (ref 70–99)
Potassium: 4 mmol/L (ref 3.5–5.1)
Sodium: 142 mmol/L (ref 135–145)

## 2024-10-04 LAB — GLUCOSE, CAPILLARY
Glucose-Capillary: 193 mg/dL — ABNORMAL HIGH (ref 70–99)
Glucose-Capillary: 276 mg/dL — ABNORMAL HIGH (ref 70–99)
Glucose-Capillary: 164 mg/dL — ABNORMAL HIGH (ref 70–99)
Glucose-Capillary: 173 mg/dL — ABNORMAL HIGH (ref 70–99)
Glucose-Capillary: 262 mg/dL — ABNORMAL HIGH (ref 70–99)

## 2024-10-04 MED ORDER — INSULIN ASPART 100 UNIT/ML IJ SOLN
2.0000 [IU] | Freq: Three times a day (TID) | INTRAMUSCULAR | Status: DC
  Administered 2024-10-04 – 2024-10-07 (×6): 2 [IU] via SUBCUTANEOUS
  Filled 2024-10-04 (×6): qty 2

## 2024-10-04 MED ORDER — INSULIN GLARGINE-YFGN 100 UNIT/ML ~~LOC~~ SOLN
13.0000 [IU] | Freq: Every day | SUBCUTANEOUS | Status: DC
  Administered 2024-10-05 – 2024-10-12 (×8): 13 [IU] via SUBCUTANEOUS
  Filled 2024-10-04 (×8): qty 0.13

## 2024-10-04 NOTE — Inpatient Diabetes Management (Signed)
 Inpatient Diabetes Program Recommendations  AACE/ADA: New Consensus Statement on Inpatient Glycemic Control (2015)  Target Ranges:  Prepandial:   less than 140 mg/dL      Peak postprandial:   less than 180 mg/dL (1-2 hours)      Critically ill patients:  140 - 180 mg/dL   Lab Results  Component Value Date   GLUCAP 276 (H) 10/04/2024   HGBA1C 9.0 (H) 09/10/2024    Review of Glycemic Control  Diabetes history: DM2 Outpatient Diabetes medications: Humalog 0-12 TID, Lantus 10 QAM Current orders for Inpatient glycemic control: Semglee 10 daily, Novolog  0-9 TID with meals   HgbA1C - 9.0% Post-prandials elevated  Inpatient Diabetes Program Recommendations:   Consider adding Novolog  2 units TID with meals if eating > 50%   Continue to follow.  Thank you. Shona Brandy, RD, LDN, CDCES Inpatient Diabetes Coordinator [phone]

## 2024-10-04 NOTE — Plan of Care (Signed)
  Problem: Coping: Goal: Ability to adjust to condition or change in health will improve Outcome: Not Applicable   Problem: Metabolic: Goal: Ability to maintain appropriate glucose levels will improve Outcome: Not Applicable

## 2024-10-04 NOTE — Progress Notes (Signed)
 Physical Therapy Treatment Patient Details Name: Doris Wyatt MRN: [id number] DOB: 1946-08-03 Today's Date: 10/04/2024   History of Present Illness 78 yo female admitted with L femoral shaft fracture. S/P ORIF 10/31. Previous L hip fx with IM rod insertion 09/08/24. Hx of Alz, DM, COPD, MDD, back surgery, osteoporosis, falls.    PT Comments  POD # 5    L ORIF Cognition Comments: AxO x 1 awake but unable to follow any commands.  Fearful.  Confused. Assisted OOB was difficult.  General transfer comment: Required + 2 side by side HHA/Max Assist to transfer from elevated bed to [facility] then again from [facility] back to bed.  Pt very fearful/confused and grimacing with pain.  Assisted with a wash up with NT while on BSC.  Great difficulty tolerating WBing thru L LE.  Required increased time to offer comfort and reassurance.  Unable to tolerate sitting on BSC very long due to increased L hip pain.  Assisted back to bed vs recliner.  Positioned to comfort with multiple pillows.  LPT has rec Pt will need ST Rehab at SNF to address mobility and functional decline prior to safely returning home.    If plan is discharge home, recommend the following: Two people to help with walking and/or transfers;Two people to help with bathing/dressing/bathroom;Assist for transportation;Help with stairs or ramp for entrance;Assistance with cooking/housework   Can travel by private vehicle     No  Equipment Recommendations       Recommendations for Other Services       Precautions / Restrictions Precautions Precautions: Fall Recall of Precautions/Restrictions: Impaired Precaution/Restrictions Comments: Hx Alheimers/Incont Restrictions Weight Bearing Restrictions Per Provider Order: No LLE Weight Bearing Per Provider Order: Weight bearing as tolerated     Mobility  Bed Mobility Overal bed mobility: Needs Assistance Bed Mobility: Supine to Sit, Sit to Supine     Supine to sit: Max assist, +2 for physical  assistance, +2 for safety/equipment, HOB elevated Sit to supine: Total assist, +2 for physical assistance   General bed mobility comments: Required + 2 Max/Total Assist to gently/carefully assist Pt OOB as well as back to bed.  Pt grimacing with pain.    Transfers Overall transfer level: Needs assistance Equipment used: 2 person hand held assist Transfers: Bed to chair/wheelchair/BSC   Stand pivot transfers: Max assist, Total assist, +2 physical assistance, +2 safety/equipment, From elevated surface         General transfer comment: Required + 2 side by side HHA/Max Assist to transfer from elevated bed to [facility] then again from [facility] back to bed.  Pt very fearful/confused and grimacing with pain.  Assisted with a wash up with NT while on BSC.  Great difficulty tolerating WBing thru L LE.  Required increased time to offer comfort and reassurance.  Unable to tolerate sitting on BSC very long due to increased L hip pain.  Assisted back to bed vs recliner.  Positioned to comfort with multiple pillows.    Ambulation/Gait               General Gait Details: unable to tolerate   Stairs             Wheelchair Mobility     Tilt Bed    Modified Rankin (Stroke Patients Only)       Balance  Communication    Cognition Arousal: Alert Behavior During Therapy: Anxious   PT - Cognitive impairments: History of cognitive impairments                       PT - Cognition Comments: AxO x 1 awake but unable to follow any commands.  Fearful.  Confused.        Cueing    Exercises      General Comments        Pertinent Vitals/Pain Pain Assessment Pain Assessment: Faces Faces Pain Scale: Hurts whole lot Pain Location: LLE with movement Pain Descriptors / Indicators: Grimacing, Guarding, Moaning, Operative site guarding, Discomfort Pain Intervention(s): Monitored during session, Repositioned    Home  Living                          Prior Function            PT Goals (current goals can now be found in the care plan section) Progress towards PT goals: Progressing toward goals    Frequency    Min 2X/week      PT Plan      Co-evaluation              AM-PAC PT 6 Clicks Mobility   Outcome Measure  Help needed turning from your back to your side while in a flat bed without using bedrails?: Total Help needed moving from lying on your back to sitting on the side of a flat bed without using bedrails?: Total Help needed moving to and from a bed to a chair (including a wheelchair)?: Total Help needed standing up from a chair using your arms (e.g., wheelchair or bedside chair)?: Total Help needed to walk in hospital room?: Total Help needed climbing 3-5 steps with a railing? : Total 6 Click Score: 6    End of Session Equipment Utilized During Treatment: Gait belt Activity Tolerance: Patient limited by pain Patient left: in bed;with call bell/phone within reach;with bed alarm set Nurse Communication: Mobility status PT Visit Diagnosis: Difficulty in walking, not elsewhere classified (R26.2);Muscle weakness (generalized) (M62.81);History of falling (Z91.81);Pain Pain - Right/Left: Left Pain - part of body: Hip;Leg     Time: 8761-8695 PT Time Calculation (min) (ACUTE ONLY): 26 min  Charges:    $Therapeutic Activity: 23-37 mins PT General Charges $$ ACUTE PT VISIT: 1 Visit                     Katheryn Leap  PTA Acute  Rehabilitation Services Office M-F          [phone]

## 2024-10-04 NOTE — Progress Notes (Signed)
 [facility]                                                                              Doris Wyatt, is a 78 y.o. female, DOB - [birth year], FMW:[id number] Admit date - 09/28/2024    Outpatient Primary MD for the patient is [name], MD  LOS - 6  days  Chief Complaint  Patient presents with   Fall       Brief summary   Patient is a 78 year old female with COPD, diverticulosis,/diverticulitis with perforation, nephrolithiasis, anxiety, depression, hyperlipidemia, hypertension, Alzheimer's disease, type 2 diabetes, who had recently undergone a left femur intramedullary nail insertion 3 weeks ago and was discharged to SNF presented to the ED after sustaining a unwitnessed fall 2 days back with pain on the left hip.  In the ED, vitals were stable.  Blood pressure slightly elevated.  Labs were unremarkable.  Hemoglobin was 11.3.  X-ray of the left femur showed mildly displaced spiral fracture of the mid femoral shaft with evidence of open reduction and internal fixation of recent intertrochanteric fracture with orthopedic screws.  Orthopedics was consulted.   Assessment & Plan      Mechanical fall with left femoral shaft fracture  - Status post IM nailing of the left humerus on 09/29/2024.  - Weightbearing as tolerated as per orthopedics, recommended Lovenox subcu injection for DVT prophylaxis for 4 weeks..  -  Physical therapy has recommended skilled nursing facility placement.  -  Follow-up with [facility] Dr. Reyne for 2 weeks for wound check.   Essential hypertension -Continue Avapro .       COPD (chronic obstructive pulmonary disease) - Continue bronchodilators.  Appears compensated at this time.     Hyperlipidemia  -Continue Lipitor     Type 2 diabetes mellitus with hyperglycemia, uncontrolled -Hemoglobin A1c 9.0 on 09/10/2024  CBG (last 3)  Recent Labs    10/03/24 2044 10/04/24 0742 10/04/24 1129  GLUCAP 177* 193* 276*    - Increased Semglee to 13 units daily, continue SSI, added 2 units 3 times daily with meals    Generalized anxiety disorder Continue Remeron     Osteoporosis Follow-up with PCP as outpatient   Alzheimer's dementia Continue memantine  and divalproex  .    Underweight Nutrition Problem: Increased nutrient needs Etiology: post-op healing, hip fracture  Signs/Symptoms: estimated needs Interventions: Glucerna shake, MVI Estimated body mass index is 17.32 kg/m as calculated from the following:   Height as of this encounter: 5' 5 (1.651 m).   Weight as of this encounter: 47.2 kg.  Code Status: Full code DVT Prophylaxis:  enoxaparin (LOVENOX) injection 40 mg Start: 09/30/24 1000 SCDs Start: 09/29/24 1418   Level of Care: Level of care: Telemetry Family Communication: Updated patient Disposition Plan:      Remains inpatient appropriate: awaiting SNF   Procedures:  09/29/2024 OPEN REDUCTION AND INTRAMEDULLARY NAIL FIXATION OF LEFT FEMUR   Consultants:   Orthopedics  Antimicrobials:   Anti-infectives (From admission, onward)    Start     Dose/Rate Route Frequency Ordered Stop   09/30/24 1200  ceFAZolin  (ANCEF ) IVPB 2g/100 mL premix  2 g 200 mL/hr over 30 Minutes Intravenous On call to O.R. 09/29/24 1117 09/30/24 2042   09/29/24 1700  ceFAZolin  (ANCEF ) IVPB 2g/100 mL premix        2 g 200 mL/hr over 30 Minutes Intravenous Every 6 hours 09/29/24 1417 09/30/24 0447   09/29/24 1141  ceFAZolin  (ANCEF ) 2-4 GM/100ML-% IVPB       Note to Pharmacy: Chauncey Dace D: cabinet override      09/29/24 1141 09/29/24 1338          Medications  aspirin EC  81 mg Oral QPM   atorvastatin   40 mg Oral Daily   bisacodyl  5 mg Oral Daily   divalproex   125 mg Oral BID   docusate sodium   100 mg Oral BID   enoxaparin (LOVENOX) injection  40 mg Subcutaneous Q24H   feeding supplement (GLUCERNA SHAKE)  237 mL Oral TID BM   insulin  aspart  0-9 Units Subcutaneous TID WC    insulin  aspart  2 Units Subcutaneous TID WC   insulin  glargine-yfgn  10 Units Subcutaneous Daily   irbesartan   75 mg Oral Daily   lidocaine   1 patch Transdermal Q24H   memantine   10 mg Oral BID   mirtazapine  15 mg Oral QHS   multivitamin with minerals  1 tablet Oral Daily   pantoprazole  40 mg Oral Daily   polyethylene glycol  17 g Oral Daily      Subjective:   [name] was seen and examined today.  No acute issues.  Patient denies dizziness, chest pain, shortness of breath, abdominal pain, N/V. No acute events overnight.  Awaiting SNF.  Objective:   Vitals:   10/03/24 0634 10/03/24 1348 10/03/24 2046 10/04/24 0725  BP: (!) 140/64 (!) 132/57 (!) 156/87 (!) 153/63  Pulse: 72 85 76 71  Resp: 16 16 18 16   Temp: (!) 97.5 F (36.4 C) 98.4 F (36.9 C) 98.6 F (37 C) 98.2 F (36.8 C)  TempSrc: Oral  Oral Oral  SpO2: 99% 97% 98% 92%  Weight:      Height:        Intake/Output Summary (Last 24 hours) at 10/04/2024 1249 Last data filed at 10/04/2024 0600 Gross per 24 hour  Intake 450 ml  Output 250 ml  Net 200 ml     Wt Readings from Last 3 Encounters:  09/29/24 47.2 kg  09/08/24 44.3 kg  02/02/23 47.2 kg     Exam General: Alert and oriented, NAD, pleasant Cardiovascular: S1 S2 auscultated,  RRR Respiratory: Clear to auscultation bilaterally, no wheezing Gastrointestinal: Soft, nontender, nondistended, + bowel sounds Ext: no pedal edema bilaterally Neuro: No new deficits Psych: Pleasant    Data Reviewed:  I have personally reviewed following labs    CBC Lab Results  Component Value Date   WBC 5.5 10/02/2024   RBC 3.51 (L) 10/02/2024   HGB 10.7 (L) 10/02/2024   HCT 35.3 (L) 10/02/2024   MCV 100.6 (H) 10/02/2024   MCH 30.5 10/02/2024   PLT 327 10/02/2024   MCHC 30.3 10/02/2024   RDW 15.2 10/02/2024   LYMPHSABS 0.7 09/28/2024   MONOABS 1.2 (H) 09/28/2024   EOSABS 0.0 09/28/2024   BASOSABS 0.0 09/28/2024     Last metabolic panel Lab  Results  Component Value Date   NA 142 10/04/2024   K 4.0 10/04/2024   CL 105 10/04/2024   CO2 31 10/04/2024   BUN 12 10/04/2024   CREATININE 0.37 (L) 10/04/2024   GLUCOSE 190 (  H) 10/04/2024   GFRNONAA >60 10/04/2024   GFRAA >60 11/10/2017   CALCIUM  8.9 10/04/2024   PROT 6.1 (L) 09/29/2024   ALBUMIN 3.3 (L) 09/29/2024   BILITOT 0.7 09/29/2024   ALKPHOS 149 (H) 09/29/2024   AST 19 09/29/2024   ALT 17 09/29/2024   ANIONGAP 7 10/04/2024    CBG (last 3)  Recent Labs    10/03/24 2044 10/04/24 0742 10/04/24 1129  GLUCAP 177* 193* 276*      Coagulation Profile: Recent Labs  Lab 09/28/24 1122  INR 1.1     Radiology Studies: I have personally reviewed the imaging studies  No results found.     Nydia Distance M.D. [facility] 10/04/2024, 12:49 PM  Available via Epic secure chat 7am-7pm After 7 pm, please refer to night coverage provider listed on amion.

## 2024-10-04 NOTE — Progress Notes (Signed)
 Occupational Therapy Treatment Patient Details Name: Doris Wyatt MRN: [id number] DOB: [birth year] Today's Date: 10/04/2024   History of present illness 78 yr old female admitted with fall and L femoral shaft fracture. S/P ORIF 09/29/24. Recent L hip fracture with IM rod insertion 09/08/24. Hx of Alzheimer's, DM, COPD, MDD, back surgery, osteoporosis, falls.   OT comments  Pt presented with increased confusion and disorientation today. She was also noted to be with lethargy, impaired command follow, and impaired problem solving. She was fearful of progressive activity attempts and with guarded movements, given increased pain of her LLE. Her overall activity tolerance was compromised. She required max hand-over-hand assist for simple feeding and face washing in bed, as well as total assist for rolling to the right. She tolerated minimal ROM to her LLE, given noted painful withdrawal response. Continue OT plan of care. Patient will benefit from continued inpatient follow up therapy, <3 hours/day       If plan is discharge home, recommend the following:  A lot of help with bathing/dressing/bathroom;Assistance with cooking/housework;Assist for transportation;Help with stairs or ramp for entrance;Direct supervision/assist for medications management;Supervision due to cognitive status;A lot of help with walking and/or transfers;Direct supervision/assist for financial management   Equipment Recommendations  Other (comment) (defer to next level of care)    Recommendations for Other Services      Precautions / Restrictions Precautions Precautions: Fall Restrictions LLE Weight Bearing Per Provider Order: Weight bearing as tolerated       Mobility Bed Mobility Overal bed mobility: Needs Assistance   Rolling: Total assist                     ADL either performed or assessed with clinical judgement   ADL Overall ADL's : Needs assistance/impaired Eating/Feeding: Maximal  assistance;Cueing for sequencing;Bed level Eating/Feeding Details (indicate cue type and reason): Pt required max hand-over-hand assist to drink from a cup. She was limited by lethargy and increased confusion. Max cues were also needed for attention to tasks and initiation. Grooming: Maximal assistance;Bed level;Cueing for sequencing Grooming Details (indicate cue type and reason): Pt required max hand-over-hand assist to perform face washing in bed. She was limited by lethargy and increased confusion. Max cues were also needed for thoroughness with task, attention, and initiation.               Cognition Arousal: Alert Behavior During Therapy: Anxious Cognition: History of cognitive impairments   Orientation impairments: Time, Situation, Place Awareness: Online awareness impaired Memory impairment (select all impairments): Declarative long-term memory, Working memory, Short-term memory Attention impairment (select first level of impairment): Divided attention, Sustained attention Executive functioning impairment (select all impairments): Reasoning, Organization, Sequencing, Problem solving, Initiation                   Following commands: Impaired Following commands impaired: Follows one step commands inconsistently      Cueing   Cueing Techniques: Verbal cues, Gestural cues, Tactile cues             Pertinent Vitals/ Pain       Pain Assessment Pain Assessment: Faces Pain Score: 8  Pain Location: LLE with movement Pain Descriptors / Indicators: Grimacing, Guarding, Moaning, Operative site guarding, Discomfort Pain Intervention(s): Limited activity within patient's tolerance, Monitored during session, Repositioned   Frequency  Min 2X/week        Progress Toward Goals  OT Goals(current goals can now be found in the care plan section)     Acute  Rehab OT Goals OT Goal Formulation: Patient unable to participate in goal setting Time For Goal Achievement:  10/14/24 Potential to Achieve Goals: Fair  Plan         AM-PAC OT 6 Clicks Daily Activity     Outcome Measure   Help from another person eating meals?: A Lot Help from another person taking care of personal grooming?: A Lot Help from another person toileting, which includes using toliet, bedpan, or urinal?: Total Help from another person bathing (including washing, rinsing, drying)?: Total Help from another person to put on and taking off regular upper body clothing?: A Lot Help from another person to put on and taking off regular lower body clothing?: Total 6 Click Score: 9    End of Session Equipment Utilized During Treatment: Other (comment) (N/A)  OT Visit Diagnosis: Unsteadiness on feet (R26.81);Other abnormalities of gait and mobility (R26.89);Muscle weakness (generalized) (M62.81);Pain;Other symptoms and signs involving cognitive function;History of falling (Z91.81) Pain - Right/Left: Left Pain - part of body: Hip   Activity Tolerance Patient limited by pain   Patient Left in bed;with call bell/phone within reach;with bed alarm set   Nurse Communication Mobility status        Time: 1525-1540 OT Time Calculation (min): 15 min  Charges: OT General Charges $OT Visit: 1 Visit OT Treatments $Self Care/Home Management : 8-22 mins     Delanna JINNY Lesches, OTR/L 10/04/2024, 5:04 PM

## 2024-10-05 DIAGNOSIS — W19XXXA Unspecified fall, initial encounter: Secondary | ICD-10-CM | POA: Diagnosis not present

## 2024-10-05 DIAGNOSIS — M9702XA Periprosthetic fracture around internal prosthetic left hip joint, initial encounter: Secondary | ICD-10-CM | POA: Diagnosis not present

## 2024-10-05 DIAGNOSIS — F411 Generalized anxiety disorder: Secondary | ICD-10-CM | POA: Diagnosis not present

## 2024-10-05 DIAGNOSIS — J410 Simple chronic bronchitis: Secondary | ICD-10-CM | POA: Diagnosis not present

## 2024-10-05 LAB — CBC
HCT: 36.1 % (ref 36.0–46.0)
Hemoglobin: 11 g/dL — ABNORMAL LOW (ref 12.0–15.0)
MCH: 30.9 pg (ref 26.0–34.0)
MCHC: 30.5 g/dL (ref 30.0–36.0)
MCV: 101.4 fL — ABNORMAL HIGH (ref 80.0–100.0)
Platelets: 285 K/uL (ref 150–400)
RBC: 3.56 MIL/uL — ABNORMAL LOW (ref 3.87–5.11)
RDW: 15.5 % (ref 11.5–15.5)
WBC: 5.7 K/uL (ref 4.0–10.5)
nRBC: 0 % (ref 0.0–0.2)

## 2024-10-05 LAB — BASIC METABOLIC PANEL WITH GFR
Anion gap: 7 (ref 5–15)
BUN: 14 mg/dL (ref 8–23)
CO2: 30 mmol/L (ref 22–32)
Calcium: 8.8 mg/dL — ABNORMAL LOW (ref 8.9–10.3)
Chloride: 104 mmol/L (ref 98–111)
Creatinine, Ser: 0.45 mg/dL (ref 0.44–1.00)
GFR, Estimated: >60 mL/min (ref >60)
Glucose, Bld: 226 mg/dL — ABNORMAL HIGH (ref 70–99)
Potassium: 4.1 mmol/L (ref 3.5–5.1)
Sodium: 141 mmol/L (ref 135–145)

## 2024-10-05 LAB — GLUCOSE, CAPILLARY
Glucose-Capillary: 204 mg/dL — ABNORMAL HIGH (ref 70–99)
Glucose-Capillary: 131 mg/dL — ABNORMAL HIGH (ref 70–99)
Glucose-Capillary: 85 mg/dL (ref 70–99)
Glucose-Capillary: 79 mg/dL (ref 70–99)

## 2024-10-05 NOTE — Plan of Care (Signed)
  Problem: Safety: Goal: Ability to remain free from injury will improve Outcome: Progressing   Problem: Pain Managment: Goal: General experience of comfort will improve and/or be controlled Outcome: Progressing   Problem: Coping: Goal: Level of anxiety will decrease Outcome: Progressing

## 2024-10-05 NOTE — Progress Notes (Signed)
 [facility]                                                                              Doris Wyatt, is a 78 y.o. female, DOB - October 02, 1946, FMW:[id number] Admit date - 09/28/2024    Outpatient Primary MD for the patient is Doris Artist PARAS, MD  LOS - 7  days  Chief Complaint  Patient presents with   Fall       Brief summary   Patient is a 78 year old female with COPD, diverticulosis,/diverticulitis with perforation, nephrolithiasis, anxiety, depression, hyperlipidemia, hypertension, Alzheimer's disease, type 2 diabetes, who had recently undergone a left femur intramedullary nail insertion 3 weeks ago and was discharged to SNF presented to the ED after sustaining a unwitnessed fall 2 days back with pain on the left hip.  In the ED, vitals were stable.  Blood pressure slightly elevated.  Labs were unremarkable.  Hemoglobin was 11.3.  X-ray of the left femur showed mildly displaced spiral fracture of the mid femoral shaft with evidence of open reduction and internal fixation of recent intertrochanteric fracture with orthopedic screws.  Orthopedics was consulted. - Status post IM nailing of the left humerus on 09/29/2024.   Awaiting placement  Assessment & Plan   Mechanical fall with left femoral shaft fracture  - Status post IM nailing of the left humerus on 09/29/2024.  - Weightbearing as tolerated as per orthopedics, recommended Lovenox subcu injection for DVT prophylaxis for 4 weeks..  -  Physical therapy has recommended skilled nursing facility placement.  -  Follow-up with [facility], Dr. Reyne for 2 weeks for wound check.   Essential hypertension -Continue Avapro .       COPD (chronic obstructive pulmonary disease) - Stable, no wheezing, continue bronchodilators      Hyperlipidemia  -Continue Lipitor     Type 2 diabetes mellitus with hyperglycemia, uncontrolled -Hemoglobin A1c 9.0 on 09/10/2024  CBG (last 3)  Recent Labs     10/04/24 2139 10/05/24 0749 10/05/24 1156  GLUCAP 262* 204* 131*   - Continue Semglee to 13 units daily, continue SSI, added 2 units 3 times daily with meals    Generalized anxiety disorder Continue Remeron     Osteoporosis Follow-up with PCP as outpatient   Alzheimer's dementia Continue memantine  and divalproex  .    Underweight Nutrition Problem: Increased nutrient needs Etiology: post-op healing, hip fracture  Signs/Symptoms: estimated needs Interventions: Glucerna shake, MVI Estimated body mass index is 17.32 kg/m as calculated from the following:   Height as of this encounter: 5' 5 (1.651 m).   Weight as of this encounter: 47.2 kg.  Code Status: Full code DVT Prophylaxis:  enoxaparin (LOVENOX) injection 40 mg Start: 09/30/24 1000 SCDs Start: 09/29/24 1418   Level of Care: Level of care: Telemetry Family Communication: Updated patient Disposition Plan:      Remains inpatient appropriate: awaiting SNF   Procedures:  09/29/2024 OPEN REDUCTION AND INTRAMEDULLARY NAIL FIXATION OF LEFT FEMUR   Consultants:   Orthopedics  Antimicrobials:   Anti-infectives (From admission, onward)    Start     Dose/Rate Route Frequency Ordered Stop   09/30/24 1200  ceFAZolin  (ANCEF ) IVPB 2g/100 mL premix        2 g 200 mL/hr over 30 Minutes Intravenous On call to O.R. 09/29/24 1117 09/30/24 2042   09/29/24 1700  ceFAZolin  (ANCEF ) IVPB 2g/100 mL premix        2 g 200 mL/hr over 30 Minutes Intravenous Every 6 hours 09/29/24 1417 09/30/24 0447   09/29/24 1141  ceFAZolin  (ANCEF ) 2-4 GM/100ML-% IVPB       Note to Pharmacy: Chauncey Dace D: cabinet override      09/29/24 1141 09/29/24 1338          Medications  aspirin EC  81 mg Oral QPM   atorvastatin   40 mg Oral Daily   bisacodyl  5 mg Oral Daily   divalproex   125 mg Oral BID   docusate sodium   100 mg Oral BID   enoxaparin (LOVENOX) injection  40 mg Subcutaneous Q24H   feeding supplement (GLUCERNA SHAKE)  237 mL  Oral TID BM   insulin  aspart  0-9 Units Subcutaneous TID WC   insulin  aspart  2 Units Subcutaneous TID WC   insulin  glargine-yfgn  13 Units Subcutaneous Daily   irbesartan   75 mg Oral Daily   lidocaine   1 patch Transdermal Q24H   memantine   10 mg Oral BID   mirtazapine  15 mg Oral QHS   multivitamin with minerals  1 tablet Oral Daily   pantoprazole  40 mg Oral Daily   polyethylene glycol  17 g Oral Daily      Subjective:   [name] was seen and examined today.  No acute issues, awaiting SNF.   Patient denies dizziness, chest pain, shortness of breath, abdominal pain, N/V. No acute events overnight.   Objective:   Vitals:   10/04/24 0725 10/04/24 1347 10/04/24 2025 10/05/24 0659  BP: (!) 153/63 (!) 108/56 124/68 (!) 147/66  Pulse: 71 88 76 71  Resp: 16 16 14 17   Temp: 98.2 F (36.8 C)  98.1 F (36.7 C) 97.8 F (36.6 C)  TempSrc: Oral  Oral Oral  SpO2: 92% 96% 95% 97%  Weight:      Height:        Intake/Output Summary (Last 24 hours) at 10/05/2024 1235 Last data filed at 10/05/2024 0700 Gross per 24 hour  Intake 300 ml  Output 50 ml  Net 250 ml     Wt Readings from Last 3 Encounters:  09/29/24 47.2 kg  09/08/24 44.3 kg  02/02/23 47.2 kg   Physical Exam General: Alert and, NAD, comfortable, pleasant Cardiovascular: S1 S2 clear, RRR.  Respiratory: CTAB Gastrointestinal: Soft, nontender, nondistended, NBS Ext: no pedal edema bilaterally Neuro: no new deficits   Data Reviewed:  I have personally reviewed following labs    CBC Lab Results  Component Value Date   WBC 5.7 10/05/2024   RBC 3.56 (L) 10/05/2024   HGB 11.0 (L) 10/05/2024   HCT 36.1 10/05/2024   MCV 101.4 (H) 10/05/2024   MCH 30.9 10/05/2024   PLT 285 10/05/2024   MCHC 30.5 10/05/2024   RDW 15.5 10/05/2024   LYMPHSABS 0.7 09/28/2024   MONOABS 1.2 (H) 09/28/2024   EOSABS 0.0 09/28/2024   BASOSABS 0.0 09/28/2024     Last metabolic panel Lab Results  Component Value Date   NA  141 10/05/2024   K 4.1 10/05/2024   CL 104 10/05/2024   CO2 30 10/05/2024   BUN 14 10/05/2024   CREATININE 0.45 10/05/2024   GLUCOSE 226 (H) 10/05/2024  GFRNONAA >60 10/05/2024   GFRAA >60 11/10/2017   CALCIUM  8.8 (L) 10/05/2024   PROT 6.1 (L) 09/29/2024   ALBUMIN 3.3 (L) 09/29/2024   BILITOT 0.7 09/29/2024   ALKPHOS 149 (H) 09/29/2024   AST 19 09/29/2024   ALT 17 09/29/2024   ANIONGAP 7 10/05/2024    CBG (last 3)  Recent Labs    10/04/24 2139 10/05/24 0749 10/05/24 1156  GLUCAP 262* 204* 131*      Coagulation Profile: No results for input(s): INR, PROTIME in the last 168 hours.    Radiology Studies: I have personally reviewed the imaging studies  No results found.     Nydia Distance M.D. [facility] 10/05/2024, 12:35 PM  Available via Epic secure chat 7am-7pm After 7 pm, please refer to night coverage provider listed on amion.

## 2024-10-05 NOTE — Progress Notes (Signed)
 Nutrition Follow-up  DOCUMENTATION CODES:   Severe malnutrition in context of chronic illness  INTERVENTION:  - Regular diet.   - Add assistance with meals to support increasing intake.   - Continue automatic trays to ensure patient receives 3 meals a day given dementia. -Glucerna Shake po TID, each supplement provides 220 kcal and 10 grams of protein  - Encourage intake at all meals and of supplements.   -Multivitamin with minerals daily - Monitor weight trends.  NUTRITION DIAGNOSIS:   Severe Malnutrition related to chronic illness (COPD; dementia) as evidenced by severe fat depletion, severe muscle depletion. *new  GOAL:   Patient will meet greater than or equal to 90% of their needs *progressing  MONITOR:   PO intake, Supplement acceptance, Weight trends  REASON FOR ASSESSMENT:   Consult Hip fracture protocol  ASSESSMENT:   78 y.o. female with medical history significant of external pain,*Tritus, COPD, diverticulosis,/diverticulitis with perforation, nephrolithiasis, anxiety, depression, hyperlipidemia, hypertension, Alzheimer's disease, osteoporosis, history of pneumonia, type 2 diabetes, ureteral stone with hydronephrosis who underwent a left femur intramedullary nail insertion 3 weeks ago, discharged to SNF, but sent to the emergency department after having an unwitnessed fall 2 days ago and complaining of pain in the left hip area.  Left femur x-ray showing mildly displaced spiral fracture of the mid femoral shaft.  Patient sleeping at time of visit, no family at bedside.  RD observed untouched breakfast tray in room and lunch tray in front of patient but untouched besides a cup of soup that was mostly untouched. She is documented to be consuming 0-50% of meals with an average of 26% over the past week.  Patient awoke briefly but often fell back asleep during conversation. History of dementia so unable to obtain any nutrition history.   Patient did not respond when  asked about Glucerna. She is documented to be accepting 1-3 daily.  Asked if having someone help her with her meals would be beneficial and patient stated yes.    Admit weight: 104# Current weight: 104# (no new wt taken since admit)  Medications reviewed and include: Dulcolax, Colace, Miralax , Remeron, MVI  Labs reviewed:  - HA1C 9.0 (as of 10/12) Blood Glucose 164-276 x24 hours   NUTRITION - FOCUSED PHYSICAL EXAM:  Flowsheet Row Most Recent Value  Orbital Region Severe depletion  Upper Arm Region Severe depletion  Thoracic and Lumbar Region Severe depletion  Buccal Region Severe depletion  Temple Region Severe depletion  Clavicle Bone Region Severe depletion  Clavicle and Acromion Bone Region Severe depletion  Scapular Bone Region Severe depletion  Dorsal Hand Severe depletion  Patellar Region Moderate depletion  Anterior Thigh Region Moderate depletion  Posterior Calf Region Moderate depletion  Edema (RD Assessment) None  Hair Reviewed  Eyes Reviewed  Mouth Reviewed  Skin Reviewed  Nails Reviewed    Diet Order:   Diet Order             Diet regular Room service appropriate? Yes; Fluid consistency: Thin  Diet effective now                   EDUCATION NEEDS:  Not appropriate for education at this time  Skin:  Skin Assessment: Skin Integrity Issues: Skin Integrity Issues:: Incisions Incisions: Left Leg  Last BM:  11/5 - type 6  Height:  Ht Readings from Last 1 Encounters:  09/29/24 5' 5 (1.651 m)   Weight:  Wt Readings from Last 1 Encounters:  09/29/24 47.2 kg  BMI:  Body mass index is 17.32 kg/m.  Estimated Nutritional Needs:  Kcal:  1350-1550 Protein:  65-75g Fluid:  1.6L/day    Trude Ned RD, LDN Contact via Secure Chat.

## 2024-10-05 NOTE — TOC Progression Note (Signed)
 Transition of Care [facility]) - Progression Note    Patient Details  Name: Doris Wyatt MRN: [id number] Date of Birth: [birth year]  [facility]) CM/SW Contact  Alfonse JONELLE Rex, RN Phone Number: 10/05/2024, 2:33 PM  Clinical Narrative:   Call to patient's dtr , [name], to review SNF bed offers [facility], [facility]), request NCM to email to her at [email or web address], email sent per her request, await facility choice.     Expected Discharge Plan: Skilled Nursing Facility Barriers to Discharge: Continued Medical Work up               Expected Discharge Plan and Services                                               Social Drivers of Health (SDOH) Interventions SDOH Screenings   Food Insecurity: Patient Unable To Answer (09/28/2024)  Housing: Unknown (09/28/2024)  Transportation Needs: Patient Unable To Answer (09/28/2024)  Utilities: Patient Unable To Answer (09/28/2024)  Depression (PHQ2-9): Low Risk  (11/16/2019)  Social Connections: Unknown (09/28/2024)  Tobacco Use: Medium Risk (09/28/2024)    Readmission Risk Interventions    09/29/2024    1:12 PM 09/09/2024   12:13 PM  Readmission Risk Prevention Plan  Post Dischage Appt  Complete  Medication Screening  Complete  Transportation Screening Complete Complete  PCP or Specialist Appt within 5-7 Days Complete   Home Care Screening Complete   Medication Review (RN CM) Complete

## 2024-10-06 DIAGNOSIS — J410 Simple chronic bronchitis: Secondary | ICD-10-CM | POA: Diagnosis not present

## 2024-10-06 DIAGNOSIS — W19XXXA Unspecified fall, initial encounter: Secondary | ICD-10-CM | POA: Diagnosis not present

## 2024-10-06 DIAGNOSIS — M9702XA Periprosthetic fracture around internal prosthetic left hip joint, initial encounter: Secondary | ICD-10-CM | POA: Diagnosis not present

## 2024-10-06 LAB — GLUCOSE, CAPILLARY
Glucose-Capillary: 107 mg/dL — ABNORMAL HIGH (ref 70–99)
Glucose-Capillary: 146 mg/dL — ABNORMAL HIGH (ref 70–99)
Glucose-Capillary: 93 mg/dL (ref 70–99)
Glucose-Capillary: 175 mg/dL — ABNORMAL HIGH (ref 70–99)

## 2024-10-06 LAB — CREATININE, SERUM
Creatinine, Ser: 0.32 mg/dL — ABNORMAL LOW (ref 0.44–1.00)
GFR, Estimated: >60 mL/min (ref >60)

## 2024-10-06 NOTE — Progress Notes (Signed)
 Soft Mitts applied d/t patient constantly removing equipment used to monitor heart with pulling adhesives off risking skin tears/irritation due to friction.

## 2024-10-06 NOTE — Progress Notes (Signed)
 [facility]                                                                              Doris Wyatt, is a 78 y.o. female, DOB - 1946-05-22, FMW:[id number] Admit date - 09/28/2024    Outpatient Primary MD for the patient is [name], MD  LOS - 8  days  Chief Complaint  Patient presents with   Fall       Brief summary   Patient is a 78 year old female with COPD, diverticulosis,/diverticulitis with perforation, nephrolithiasis, anxiety, depression, hyperlipidemia, hypertension, Alzheimer's disease, type 2 diabetes, who had recently undergone a left femur intramedullary nail insertion 3 weeks ago and was discharged to SNF presented to the ED after sustaining a unwitnessed fall 2 days back with pain on the left hip.  In the ED, vitals were stable.  Blood pressure slightly elevated.  Labs were unremarkable.  Hemoglobin was 11.3.  X-ray of the left femur showed mildly displaced spiral fracture of the mid femoral shaft with evidence of open reduction and internal fixation of recent intertrochanteric fracture with orthopedic screws.  Orthopedics was consulted. - Status post IM nailing of the left humerus on 09/29/2024.   Awaiting placement  Assessment & Plan   Mechanical fall with left femoral shaft fracture  - Status post IM nailing of the left humerus on 09/29/2024.  - Weightbearing as tolerated as per orthopedics, recommended Lovenox subcu injection for DVT prophylaxis for 4 weeks..  -  Physical therapy has recommended skilled nursing facility placement.  -  Follow-up with [facility], Dr. Reyne for 2 weeks for wound check.   Essential hypertension -Continue Avapro .       COPD (chronic obstructive pulmonary disease) - Stable, no wheezing, continue bronchodilators      Hyperlipidemia  -Continue Lipitor     Type 2 diabetes mellitus with hyperglycemia, uncontrolled -Hemoglobin A1c 9.0 on 09/10/2024  CBG (last 3)  Recent Labs     10/05/24 2110 10/06/24 0739 10/06/24 1158  GLUCAP 79 107* 146*   - Continue Semglee to 13 units daily, continue SSI, novolog  2 units 3 times daily with meals    Generalized anxiety disorder Continue Remeron     Osteoporosis Follow-up with PCP as outpatient   Alzheimer's dementia Continue memantine  and divalproex  .    Underweight Nutrition Problem: Severe Malnutrition Etiology: chronic illness (COPD; dementia)  Signs/Symptoms: severe fat depletion, severe muscle depletion Interventions: Refer to RD note for recommendations, Glucerna shake, MVI Estimated body mass index is 17.32 kg/m as calculated from the following:   Height as of this encounter: 5' 5 (1.651 m).   Weight as of this encounter: 47.2 kg.  Code Status: Full code DVT Prophylaxis:  enoxaparin (LOVENOX) injection 40 mg Start: 09/30/24 1000 SCDs Start: 09/29/24 1418   Level of Care: Level of care: Telemetry Family Communication: Updated patient Disposition Plan:      Remains inpatient appropriate: awaiting SNF   Procedures:  09/29/2024 OPEN REDUCTION AND INTRAMEDULLARY NAIL FIXATION OF LEFT FEMUR   Consultants:   Orthopedics  Antimicrobials:   Anti-infectives (From admission, onward)    Start     Dose/Rate  Route Frequency Ordered Stop   09/30/24 1200  ceFAZolin  (ANCEF ) IVPB 2g/100 mL premix        2 g 200 mL/hr over 30 Minutes Intravenous On call to O.R. 09/29/24 1117 09/30/24 2042   09/29/24 1700  ceFAZolin  (ANCEF ) IVPB 2g/100 mL premix        2 g 200 mL/hr over 30 Minutes Intravenous Every 6 hours 09/29/24 1417 09/30/24 0447   09/29/24 1141  ceFAZolin  (ANCEF ) 2-4 GM/100ML-% IVPB       Note to Pharmacy: Chauncey Dace D: cabinet override      09/29/24 1141 09/29/24 1338          Medications  aspirin EC  81 mg Oral QPM   atorvastatin   40 mg Oral Daily   bisacodyl  5 mg Oral Daily   divalproex   125 mg Oral BID   docusate sodium   100 mg Oral BID   enoxaparin (LOVENOX) injection  40 mg  Subcutaneous Q24H   feeding supplement (GLUCERNA SHAKE)  237 mL Oral TID BM   insulin  aspart  0-9 Units Subcutaneous TID WC   insulin  aspart  2 Units Subcutaneous TID WC   insulin  glargine-yfgn  13 Units Subcutaneous Daily   irbesartan   75 mg Oral Daily   lidocaine   1 patch Transdermal Q24H   memantine   10 mg Oral BID   mirtazapine  15 mg Oral QHS   multivitamin with minerals  1 tablet Oral Daily   pantoprazole  40 mg Oral Daily   polyethylene glycol  17 g Oral Daily      Subjective:   Doris Wyatt was seen and examined today.  No acute issues. Patient denies dizziness, chest pain, shortness of breath, abdominal pain, N/V.   Objective:   Vitals:   10/04/24 2025 10/05/24 0659 10/05/24 2101 10/06/24 0638  BP: 124/68 (!) 147/66 (!) 156/62 (!) 157/85  Pulse: 76 71 82 70  Resp: 14 17 16 20   Temp: 98.1 F (36.7 C) 97.8 F (36.6 C) 98.4 F (36.9 C) 97.9 F (36.6 C)  TempSrc: Oral Oral Oral Oral  SpO2: 95% 97% 97% 97%  Weight:      Height:        Intake/Output Summary (Last 24 hours) at 10/06/2024 1218 Last data filed at 10/06/2024 0600 Gross per 24 hour  Intake 360 ml  Output --  Net 360 ml     Wt Readings from Last 3 Encounters:  09/29/24 47.2 kg  09/08/24 44.3 kg  02/02/23 47.2 kg    Physical Exam General: Alert and awake, confused, dementia  Cardiovascular: S1 S2 clear, RRR.  Respiratory: CTAB Gastrointestinal: Soft, NT, ND, NBS Ext: no pedal edema bilaterally Neuro: no new deficits Psych: dementia   Data Reviewed:  I have personally reviewed following labs    CBC Lab Results  Component Value Date   WBC 5.7 10/05/2024   RBC 3.56 (L) 10/05/2024   HGB 11.0 (L) 10/05/2024   HCT 36.1 10/05/2024   MCV 101.4 (H) 10/05/2024   MCH 30.9 10/05/2024   PLT 285 10/05/2024   MCHC 30.5 10/05/2024   RDW 15.5 10/05/2024   LYMPHSABS 0.7 09/28/2024   MONOABS 1.2 (H) 09/28/2024   EOSABS 0.0 09/28/2024   BASOSABS 0.0 09/28/2024     Last metabolic  panel Lab Results  Component Value Date   NA 141 10/05/2024   K 4.1 10/05/2024   CL 104 10/05/2024   CO2 30 10/05/2024   BUN 14 10/05/2024   CREATININE 0.32 (L) 10/06/2024  GLUCOSE 226 (H) 10/05/2024   GFRNONAA >60 10/06/2024   GFRAA >60 11/10/2017   CALCIUM  8.8 (L) 10/05/2024   PROT 6.1 (L) 09/29/2024   ALBUMIN 3.3 (L) 09/29/2024   BILITOT 0.7 09/29/2024   ALKPHOS 149 (H) 09/29/2024   AST 19 09/29/2024   ALT 17 09/29/2024   ANIONGAP 7 10/05/2024    CBG (last 3)  Recent Labs    10/05/24 2110 10/06/24 0739 10/06/24 1158  GLUCAP 79 107* 146*      Coagulation Profile: No results for input(s): INR, PROTIME in the last 168 hours.    Radiology Studies: I have personally reviewed the imaging studies  No results found.     Nydia Distance M.D. [facility] 10/06/2024, 12:18 PM  Available via Epic secure chat 7am-7pm After 7 pm, please refer to night coverage provider listed on amion.

## 2024-10-06 NOTE — Progress Notes (Signed)
 Occupational Therapy Treatment Patient Details Name: Doris Wyatt MRN: [id number] DOB: 22-May-1946 Today's Date: 10/06/2024   History of present illness 78 yr old female admitted with L femoral shaft fracture, S/P ORIF 09/29/24. Recent L hip fracture with IM rod insertion 09/08/24. PMH: Alzheimer's, DM, COPD, MDD, back surgery, osteoporosis, falls.   OT comments  The pt was seen for functional strengthening, safety education during progressive activity, and progression of functional activity. She required increased assistance for supine to sit, to donn socks seated EOB, and to transfer to the bedside chair using a RW. She presented with poor standing balance, requiring constant hands-on assist and max cues for general safety awareness. She also required cues for calming and reassurance. She presented with guarded movements, due to pain. Continue OT plan of care. Patient will benefit from continued inpatient follow up therapy, <3 hours/day.       If plan is discharge home, recommend the following:  A lot of help with bathing/dressing/bathroom;Assistance with cooking/housework;Assist for transportation;Help with stairs or ramp for entrance;Direct supervision/assist for medications management;Supervision due to cognitive status;Direct supervision/assist for financial management;Two people to help with walking and/or transfers   Equipment Recommendations  Other (comment) (defer to next setting)    Recommendations for Other Services      Precautions / Restrictions Precautions Precautions: Fall Restrictions Weight Bearing Restrictions Per Provider Order: Yes LLE Weight Bearing Per Provider Order: Weight bearing as tolerated       Mobility Bed Mobility Overal bed mobility: Needs Assistance Bed Mobility: Supine to Sit     Supine to sit: Mod assist, Used rails, HOB elevated     General bed mobility comments: required increased time and cues for general sequencing/transfer  technique, including advancing BLE, reaching with upper extremity and trunk position    Transfers Overall transfer level: Needs assistance Equipment used: Rolling walker (2 wheels) Transfers: Sit to/from Stand, Bed to chair/wheelchair/BSC Sit to Stand: Mod assist, From elevated surface Stand pivot transfers: Max assist, From elevated surface         General transfer comment: The pt required max cues for BLE positioning, walker placement and advancement, and reaching back for chair prior to sitting. Pt also required significant cues for calming and reassurance.     Balance     Sitting balance-Leahy Scale: Fair       Standing balance-Leahy Scale: Zero       ADL either performed or assessed with clinical judgement   ADL Overall ADL's : Needs assistance/impaired          Lower Body Dressing: Total assistance;Sitting/lateral leans Lower Body Dressing Details (indicate cue type and reason): to donn socks seated EOB                     Praxis     Communication Communication Communication: No apparent difficulties   Cognition Arousal: Alert Behavior During Therapy: Anxious Cognition: History of cognitive impairments, Cognition impaired   Orientation impairments: Time, Situation Awareness: Online awareness impaired Memory impairment (select all impairments): Declarative long-term memory, Working memory, Short-term memory Attention impairment (select first level of impairment): Divided attention Executive functioning impairment (select all impairments): Problem solving, Sequencing, Organization        Following commands: Impaired Following commands impaired: Follows one step commands inconsistently      Cueing   Cueing Techniques: Verbal cues, Gestural cues, Tactile cues             Pertinent Vitals/ Pain       Pain  Assessment Pain Assessment: Faces Pain Score: 7  Pain Location: LLE with movement Pain Descriptors / Indicators: Guarding, Grimacing,  Sore, Operative site guarding Pain Intervention(s): Limited activity within patient's tolerance, Monitored during session, Premedicated before session, Ice applied   Frequency  Min 2X/week        Progress Toward Goals  OT Goals(current goals can now be found in the care plan section)     Acute Rehab OT Goals OT Goal Formulation: With patient/family Time For Goal Achievement: 10/14/24 Potential to Achieve Goals: Fair  Plan         AM-PAC OT 6 Clicks Daily Activity     Outcome Measure   Help from another person eating meals?: A Little Help from another person taking care of personal grooming?: A Lot Help from another person toileting, which includes using toliet, bedpan, or urinal?: A Lot Help from another person bathing (including washing, rinsing, drying)?: A Lot Help from another person to put on and taking off regular upper body clothing?: A Little Help from another person to put on and taking off regular lower body clothing?: Total 6 Click Score: 13    End of Session Equipment Utilized During Treatment: Gait belt;Rolling walker (2 wheels)  OT Visit Diagnosis: Unsteadiness on feet (R26.81);Other abnormalities of gait and mobility (R26.89);Muscle weakness (generalized) (M62.81);Pain;Other symptoms and signs involving cognitive function;History of falling (Z91.81) Pain - Right/Left: Left Pain - part of body: Hip   Activity Tolerance Patient limited by pain   Patient Left in chair;with call bell/phone within reach;with family/visitor present   Nurse Communication  (family requesting to speak with MD and for the pt to have pain medication)        Time: 8349-8287 OT Time Calculation (min): 22 min  Charges: OT General Charges $OT Visit: 1 Visit OT Treatments $Therapeutic Activity: 8-22 mins     Delanna JINNY Lesches, OTR/L  10/06/2024, 5:40 PM

## 2024-10-06 NOTE — TOC Progression Note (Signed)
 Transition of Care [facility]) - Progression Note    Patient Details  Name: Doris Wyatt MRN: [id number] Date of Birth: Oct 22, 1946  [facility] CM/SW Contact  Alfonse JONELLE Rex, RN Phone Number: 10/06/2024, 2:22 PM  Clinical Narrative:   Call to patient's dtr [name] to follow up on SNF bed offers, [name] states one facility is too far away, the other facility was not up to their standards. [name] requested NCM send referral to [employer] at [facility] , also states she plans to visit facility to to see if she can get patient accepted. TOC will continue to follow.     Expected Discharge Plan: Skilled Nursing Facility Barriers to Discharge: Continued Medical Work up               Expected Discharge Plan and Services                                               Social Drivers of Health (SDOH) Interventions SDOH Screenings   Food Insecurity: Patient Unable To Answer (09/28/2024)  Housing: Unknown (09/28/2024)  Transportation Needs: Patient Unable To Answer (09/28/2024)  Utilities: Patient Unable To Answer (09/28/2024)  Depression (PHQ2-9): Low Risk  (11/16/2019)  Social Connections: Unknown (09/28/2024)  Tobacco Use: Medium Risk (09/28/2024)    Readmission Risk Interventions    09/29/2024    1:12 PM 09/09/2024   12:13 PM  Readmission Risk Prevention Plan  Post Dischage Appt  Complete  Medication Screening  Complete  Transportation Screening Complete Complete  PCP or Specialist Appt within 5-7 Days Complete   Home Care Screening Complete   Medication Review (RN CM) Complete

## 2024-10-07 DIAGNOSIS — M9702XA Periprosthetic fracture around internal prosthetic left hip joint, initial encounter: Secondary | ICD-10-CM | POA: Diagnosis not present

## 2024-10-07 DIAGNOSIS — J410 Simple chronic bronchitis: Secondary | ICD-10-CM | POA: Diagnosis not present

## 2024-10-07 DIAGNOSIS — W19XXXA Unspecified fall, initial encounter: Secondary | ICD-10-CM | POA: Diagnosis not present

## 2024-10-07 DIAGNOSIS — F411 Generalized anxiety disorder: Secondary | ICD-10-CM | POA: Diagnosis not present

## 2024-10-07 LAB — GLUCOSE, CAPILLARY
Glucose-Capillary: 203 mg/dL — ABNORMAL HIGH (ref 70–99)
Glucose-Capillary: 133 mg/dL — ABNORMAL HIGH (ref 70–99)
Glucose-Capillary: 53 mg/dL — ABNORMAL LOW (ref 70–99)
Glucose-Capillary: 123 mg/dL — ABNORMAL HIGH (ref 70–99)
Glucose-Capillary: 58 mg/dL — ABNORMAL LOW (ref 70–99)
Glucose-Capillary: 183 mg/dL — ABNORMAL HIGH (ref 70–99)

## 2024-10-07 MED ORDER — DEXTROSE 50 % IV SOLN
INTRAVENOUS | Status: AC
  Filled 2024-10-07: qty 50

## 2024-10-07 MED ORDER — DEXTROSE 50 % IV SOLN
25.0000 g | INTRAVENOUS | Status: AC
  Administered 2024-10-07: 12.5 g via INTRAVENOUS

## 2024-10-07 NOTE — Plan of Care (Signed)
  Problem: Nutritional: Goal: Maintenance of adequate nutrition will improve Outcome: Progressing   Problem: Skin Integrity: Goal: Risk for impaired skin integrity will decrease Outcome: Progressing   Problem: Clinical Measurements: Goal: Ability to maintain clinical measurements within normal limits will improve Outcome: Progressing   Problem: Activity: Goal: Risk for activity intolerance will decrease Outcome: Progressing   Problem: Pain Managment: Goal: General experience of comfort will improve and/or be controlled Outcome: Progressing   Problem: Safety: Goal: Ability to remain free from injury will improve Outcome: Progressing   Problem: Skin Integrity: Goal: Risk for impaired skin integrity will decrease Outcome: Progressing

## 2024-10-07 NOTE — Progress Notes (Signed)
 [facility]                                                                              Doris Wyatt, is a 78 y.o. female, DOB - 27-Oct-1946, FMW:[id number] Admit date - 09/28/2024    Outpatient Primary MD for the patient is [name], MD  LOS - 9  days  Chief Complaint  Patient presents with   Fall       Brief summary   Patient is a 78 year old female with COPD, diverticulosis,/diverticulitis with perforation, nephrolithiasis, anxiety, depression, hyperlipidemia, hypertension, Alzheimer's disease, type 2 diabetes, who had recently undergone a left femur intramedullary nail insertion 3 weeks ago and was discharged to SNF presented to the ED after sustaining a unwitnessed fall 2 days back with pain on the left hip.  In the ED, vitals were stable.  Blood pressure slightly elevated.  Labs were unremarkable.  Hemoglobin was 11.3.  X-ray of the left femur showed mildly displaced spiral fracture of the mid femoral shaft with evidence of open reduction and internal fixation of recent intertrochanteric fracture with orthopedic screws.  Orthopedics was consulted. - Status post IM nailing of the left humerus on 09/29/2024.   Awaiting placement  Assessment & Plan   Mechanical fall with left femoral shaft fracture  - Status post IM nailing of the left humerus on 09/29/2024.  - Weightbearing as tolerated as per orthopedics, recommended Lovenox subcu injection for DVT prophylaxis for 4 weeks..  -  Physical therapy has recommended skilled nursing facility placement.  -  Follow-up with [facility], Dr. Reyne for 2 weeks for wound check. - No acute issues, awaiting SNF   Essential hypertension -Continue Avapro .       COPD (chronic obstructive pulmonary disease) - Stable, no wheezing, continue bronchodilators      Hyperlipidemia  -Continue Lipitor     Type 2 diabetes mellitus with hyperglycemia, uncontrolled -Hemoglobin A1c 9.0 on 09/10/2024  CBG  (last 3)  Recent Labs    10/07/24 0810 10/07/24 1136 10/07/24 1209  GLUCAP 203* 53* 133*   - Hypoglycemia event due to not eating.  Discontinued meal coverage.,  Continue SSI sensitive, and Semglee 13 units daily    Generalized anxiety disorder Continue Remeron     Osteoporosis Follow-up with PCP as outpatient   Alzheimer's dementia Continue memantine  and divalproex  .    Underweight Nutrition Problem: Severe Malnutrition Etiology: chronic illness (COPD; dementia)  Signs/Symptoms: severe fat depletion, severe muscle depletion Interventions: Refer to RD note for recommendations, Glucerna shake, MVI Estimated body mass index is 17.32 kg/m as calculated from the following:   Height as of this encounter: 5' 5 (1.651 m).   Weight as of this encounter: 47.2 kg.  Code Status: Full code DVT Prophylaxis:  enoxaparin (LOVENOX) injection 40 mg Start: 09/30/24 1000 SCDs Start: 09/29/24 1418   Level of Care: Level of care: Telemetry Family Communication: Updated patient Disposition Plan:      Remains inpatient appropriate: awaiting SNF   Procedures:  09/29/2024 OPEN REDUCTION AND INTRAMEDULLARY NAIL FIXATION OF LEFT FEMUR   Consultants:   Orthopedics  Antimicrobials:   Anti-infectives (From admission, onward)  Start     Dose/Rate Route Frequency Ordered Stop   09/30/24 1200  ceFAZolin  (ANCEF ) IVPB 2g/100 mL premix        2 g 200 mL/hr over 30 Minutes Intravenous On call to O.R. 09/29/24 1117 09/30/24 2042   09/29/24 1700  ceFAZolin  (ANCEF ) IVPB 2g/100 mL premix        2 g 200 mL/hr over 30 Minutes Intravenous Every 6 hours 09/29/24 1417 09/30/24 0447   09/29/24 1141  ceFAZolin  (ANCEF ) 2-4 GM/100ML-% IVPB       Note to Pharmacy: Chauncey Dace D: cabinet override      09/29/24 1141 09/29/24 1338          Medications  aspirin EC  81 mg Oral QPM   atorvastatin   40 mg Oral Daily   bisacodyl  5 mg Oral Daily   divalproex   125 mg Oral BID   docusate sodium    100 mg Oral BID   enoxaparin (LOVENOX) injection  40 mg Subcutaneous Q24H   feeding supplement (GLUCERNA SHAKE)  237 mL Oral TID BM   insulin  aspart  0-9 Units Subcutaneous TID WC   insulin  aspart  2 Units Subcutaneous TID WC   insulin  glargine-yfgn  13 Units Subcutaneous Daily   irbesartan   75 mg Oral Daily   lidocaine   1 patch Transdermal Q24H   memantine   10 mg Oral BID   mirtazapine  15 mg Oral QHS   multivitamin with minerals  1 tablet Oral Daily   pantoprazole  40 mg Oral Daily   polyethylene glycol  17 g Oral Daily      Subjective:   Doris Wyatt was seen and examined today.  Not eating too well.  No acute pain, fever chills nausea or vomiting  Objective:   Vitals:   10/06/24 0638 10/06/24 1436 10/06/24 2150 10/07/24 0545  BP: (!) 157/85 (!) 96/56 (!) 155/67 (!) 159/80  Pulse: 70 100 83 83  Resp: 20 16 17 18   Temp: 97.9 F (36.6 C) 99.6 F (37.6 C) 99 F (37.2 C) 98.1 F (36.7 C)  TempSrc: Oral Oral Oral Axillary  SpO2: 97% 98% 97% 98%  Weight:      Height:       No intake or output data in the 24 hours ending 10/07/24 1356    Wt Readings from Last 3 Encounters:  09/29/24 47.2 kg  09/08/24 44.3 kg  02/02/23 47.2 kg   Physical Exam General: Awake, dementia Cardiovascular: S1 S2 clear, RRR.  Respiratory: CTAB, no wheezing Gastrointestinal: Soft, nontender, nondistended, NBS Ext: no pedal edema bilaterally Psych: dementia   Data Reviewed:  I have personally reviewed following labs    CBC Lab Results  Component Value Date   WBC 5.7 10/05/2024   RBC 3.56 (L) 10/05/2024   HGB 11.0 (L) 10/05/2024   HCT 36.1 10/05/2024   MCV 101.4 (H) 10/05/2024   MCH 30.9 10/05/2024   PLT 285 10/05/2024   MCHC 30.5 10/05/2024   RDW 15.5 10/05/2024   LYMPHSABS 0.7 09/28/2024   MONOABS 1.2 (H) 09/28/2024   EOSABS 0.0 09/28/2024   BASOSABS 0.0 09/28/2024     Last metabolic panel Lab Results  Component Value Date   NA 141 10/05/2024   K 4.1 10/05/2024    CL 104 10/05/2024   CO2 30 10/05/2024   BUN 14 10/05/2024   CREATININE 0.32 (L) 10/06/2024   GLUCOSE 226 (H) 10/05/2024   GFRNONAA >60 10/06/2024   GFRAA >60 11/10/2017   CALCIUM  8.8 (L)  10/05/2024   PROT 6.1 (L) 09/29/2024   ALBUMIN 3.3 (L) 09/29/2024   BILITOT 0.7 09/29/2024   ALKPHOS 149 (H) 09/29/2024   AST 19 09/29/2024   ALT 17 09/29/2024   ANIONGAP 7 10/05/2024    CBG (last 3)  Recent Labs    10/07/24 0810 10/07/24 1136 10/07/24 1209  GLUCAP 203* 53* 133*      Coagulation Profile: No results for input(s): INR, PROTIME in the last 168 hours.    Radiology Studies: I have personally reviewed the imaging studies  No results found.     Nydia Distance M.D. [facility] 10/07/2024, 1:56 PM  Available via Epic secure chat 7am-7pm After 7 pm, please refer to night coverage provider listed on amion.

## 2024-10-07 NOTE — Progress Notes (Signed)
 Hypoglycemic Event  CBG: 53  Treatment: D50 25 mL (12.5 gm)  Symptoms: None  Follow-up CBG: Time:1209 CBG Result:133  Possible Reasons for Event: Inadequate meal intake  Comments/MD notified: Patient responded well to treatment. Encouraging po intake but patient stating, Leave me alone, get away from me. Patient had consumed ice cream cup with am medications and part of glucerna shake, refused any other meal intake but has had poor intake this admission. Will monitor closely.   Doris Wyatt

## 2024-10-07 NOTE — Progress Notes (Signed)
 Hypoglycemic Event  CBG: 58  Treatment: 4 oz juice/soda, dinner  Symptoms: None  Follow-up CBG: Time:1826 CBG Result:123  Possible Reasons for Event: Inadequate meal intake  Comments/MD notified:Family brought food from home and requested to try to get her to eat. Patient ate about 50% of tuna sandwich and a few sips of Sunny D. Will cont to monitor     Doris Wyatt

## 2024-10-08 DIAGNOSIS — G309 Alzheimer's disease, unspecified: Secondary | ICD-10-CM | POA: Diagnosis not present

## 2024-10-08 DIAGNOSIS — M9702XA Periprosthetic fracture around internal prosthetic left hip joint, initial encounter: Secondary | ICD-10-CM | POA: Diagnosis not present

## 2024-10-08 DIAGNOSIS — W19XXXA Unspecified fall, initial encounter: Secondary | ICD-10-CM | POA: Diagnosis not present

## 2024-10-08 DIAGNOSIS — F411 Generalized anxiety disorder: Secondary | ICD-10-CM | POA: Diagnosis not present

## 2024-10-08 DIAGNOSIS — F0280 Dementia in other diseases classified elsewhere without behavioral disturbance: Secondary | ICD-10-CM

## 2024-10-08 LAB — COMPREHENSIVE METABOLIC PANEL WITH GFR
ALT: 10 U/L (ref 0–44)
AST: 16 U/L (ref 15–41)
Albumin: 3.3 g/dL — ABNORMAL LOW (ref 3.5–5.0)
Alkaline Phosphatase: 113 U/L (ref 38–126)
Anion gap: 7 (ref 5–15)
BUN: 14 mg/dL (ref 8–23)
CO2: 28 mmol/L (ref 22–32)
Calcium: 8.8 mg/dL — ABNORMAL LOW (ref 8.9–10.3)
Chloride: 104 mmol/L (ref 98–111)
Creatinine, Ser: 0.44 mg/dL (ref 0.44–1.00)
GFR, Estimated: >60 mL/min (ref >60)
Glucose, Bld: 193 mg/dL — ABNORMAL HIGH (ref 70–99)
Potassium: 4.3 mmol/L (ref 3.5–5.1)
Sodium: 139 mmol/L (ref 135–145)
Total Bilirubin: 0.7 mg/dL (ref 0.0–1.2)
Total Protein: 6 g/dL — ABNORMAL LOW (ref 6.5–8.1)

## 2024-10-08 LAB — CBC
HCT: 34.1 % — ABNORMAL LOW (ref 36.0–46.0)
Hemoglobin: 10.7 g/dL — ABNORMAL LOW (ref 12.0–15.0)
MCH: 31.3 pg (ref 26.0–34.0)
MCHC: 31.4 g/dL (ref 30.0–36.0)
MCV: 99.7 fL (ref 80.0–100.0)
Platelets: 237 K/uL (ref 150–400)
RBC: 3.42 MIL/uL — ABNORMAL LOW (ref 3.87–5.11)
RDW: 15.4 % (ref 11.5–15.5)
WBC: 6.1 K/uL (ref 4.0–10.5)
nRBC: 0 % (ref 0.0–0.2)

## 2024-10-08 LAB — GLUCOSE, CAPILLARY
Glucose-Capillary: 119 mg/dL — ABNORMAL HIGH (ref 70–99)
Glucose-Capillary: 144 mg/dL — ABNORMAL HIGH (ref 70–99)
Glucose-Capillary: 189 mg/dL — ABNORMAL HIGH (ref 70–99)
Glucose-Capillary: 237 mg/dL — ABNORMAL HIGH (ref 70–99)
Glucose-Capillary: 67 mg/dL — ABNORMAL LOW (ref 70–99)

## 2024-10-08 LAB — AMMONIA: Ammonia: 15 umol/L (ref 9–35)

## 2024-10-08 NOTE — Progress Notes (Signed)
 [facility]                                                                              Tyyne Cliett, is a 78 y.o. female, DOB - 24-Sep-1946, FMW:[id number] Admit date - 09/28/2024    Outpatient Primary MD for the patient is [name], MD  LOS - 10  days  Chief Complaint  Patient presents with   Fall       Brief summary   Patient is a 78 year old female with COPD, diverticulosis,/diverticulitis with perforation, nephrolithiasis, anxiety, depression, hyperlipidemia, hypertension, Alzheimer's disease, type 2 diabetes, who had recently undergone a left femur intramedullary nail insertion 3 weeks ago and was discharged to SNF presented to the ED after sustaining a unwitnessed fall 2 days back with pain on the left hip.  In the ED, vitals were stable.  Blood pressure slightly elevated.  Labs were unremarkable.  Hemoglobin was 11.3.  X-ray of the left femur showed mildly displaced spiral fracture of the mid femoral shaft with evidence of open reduction and internal fixation of recent intertrochanteric fracture with orthopedic screws.  Orthopedics was consulted. - Status post IM nailing of the left humerus on 09/29/2024.   Awaiting placement  Assessment & Plan   Mechanical fall with left femoral shaft fracture  - Status post IM nailing of the left humerus on 09/29/2024.  - Weightbearing as tolerated as per orthopedics, recommended Lovenox subcu injection for DVT prophylaxis for 4 weeks..  -  Physical therapy has recommended skilled nursing facility placement.  -  Follow-up with [facility], Dr. Reyne for 2 weeks for wound check. - No acute issues, awaiting SNF   Essential hypertension -Continue Avapro .       COPD (chronic obstructive pulmonary disease) - Stable, no wheezing, continue bronchodilators      Hyperlipidemia  -Continue Lipitor     Type 2 diabetes mellitus with hyperglycemia, uncontrolled -Hemoglobin A1c 9.0 on 09/10/2024  CBG  (last 3)  Recent Labs    10/07/24 1826 10/07/24 2225 10/08/24 0727  GLUCAP 123* 183* 189*   - Continue SSI sensitive, Semglee 13 units daily.  P.o. intake poor, hence holding off on meal coverage.    Osteoporosis Follow-up with PCP as outpatient   Alzheimer's dementia with sundowning - Continue memantine  and divalproex  . - Family felt that patient is more lethargic with Remeron and did not really help with eating.  Remeron held last night and patient is much more alert and awake today.  Drank the whole Glucerna but did not really eat much. -Remeron discontinued   Underweight Nutrition Problem: Severe Malnutrition Etiology: chronic illness (COPD; dementia)  Signs/Symptoms: severe fat depletion, severe muscle depletion Interventions: Refer to RD note for recommendations, Glucerna shake, MVI Estimated body mass index is 17.32 kg/m as calculated from the following:   Height as of this encounter: 5' 5 (1.651 m).   Weight as of this encounter: 47.2 kg.  Code Status: Full code DVT Prophylaxis:  enoxaparin (LOVENOX) injection 40 mg Start: 09/30/24 1000 SCDs Start: 09/29/24 1418   Level of Care: Level of care: Telemetry Family Communication: Updated patient's daughter, [name] on the phone  today. Disposition Plan:      Remains inpatient appropriate: awaiting SNF   Procedures:  09/29/2024 OPEN REDUCTION AND INTRAMEDULLARY NAIL FIXATION OF LEFT FEMUR   Consultants:   Orthopedics  Antimicrobials:   Anti-infectives (From admission, onward)    Start     Dose/Rate Route Frequency Ordered Stop   09/30/24 1200  ceFAZolin  (ANCEF ) IVPB 2g/100 mL premix        2 g 200 mL/hr over 30 Minutes Intravenous On call to O.R. 09/29/24 1117 09/30/24 2042   09/29/24 1700  ceFAZolin  (ANCEF ) IVPB 2g/100 mL premix        2 g 200 mL/hr over 30 Minutes Intravenous Every 6 hours 09/29/24 1417 09/30/24 0447   09/29/24 1141  ceFAZolin  (ANCEF ) 2-4 GM/100ML-% IVPB       Note to Pharmacy: Chauncey Dace D: cabinet override      09/29/24 1141 09/29/24 1338          Medications  aspirin EC  81 mg Oral QPM   atorvastatin   40 mg Oral Daily   bisacodyl  5 mg Oral Daily   divalproex   125 mg Oral BID   docusate sodium   100 mg Oral BID   enoxaparin (LOVENOX) injection  40 mg Subcutaneous Q24H   feeding supplement (GLUCERNA SHAKE)  237 mL Oral TID BM   insulin  aspart  0-9 Units Subcutaneous TID WC   insulin  glargine-yfgn  13 Units Subcutaneous Daily   irbesartan   75 mg Oral Daily   lidocaine   1 patch Transdermal Q24H   memantine   10 mg Oral BID   multivitamin with minerals  1 tablet Oral Daily   pantoprazole  40 mg Oral Daily   polyethylene glycol  17 g Oral Daily      Subjective:   Doris Wyatt was seen and examined today.  Much more alert and awake today, oriented to self not to place and person, drank the whole Glucerna by herself but not eating too well.  No acute nausea vomiting, chest pain or shortness of breath or fevers.  Objective:   Vitals:   10/06/24 2150 10/07/24 0545 10/07/24 2219 10/08/24 0632  BP: (!) 155/67 (!) 159/80 (!) 144/59 139/62  Pulse: 83 83 78 88  Resp: 17 18 16 16   Temp: 99 F (37.2 C) 98.1 F (36.7 C) 98 F (36.7 C) 99.2 F (37.3 C)  TempSrc: Oral Axillary Oral Oral  SpO2: 97% 98% 98% 95%  Weight:      Height:        Intake/Output Summary (Last 24 hours) at 10/08/2024 1058 Last data filed at 10/08/2024 1000 Gross per 24 hour  Intake 0 ml  Output --  Net 0 ml      Wt Readings from Last 3 Encounters:  09/29/24 47.2 kg  09/08/24 44.3 kg  02/02/23 47.2 kg    Physical Exam General: Alert and oriented x self, pleasant, states year 2066 and she is at her grandmother's house. Cardiovascular: S1 S2 clear, RRR.  Respiratory: CTAB, no wheezing Gastrointestinal: Soft, nontender, nondistended, NBS Ext: no pedal edema bilaterally Neuro: no new deficits, following commands Psych: Dementia, pleasant  Data Reviewed:  I have  personally reviewed following labs    CBC Lab Results  Component Value Date   WBC 6.1 10/08/2024   RBC 3.42 (L) 10/08/2024   HGB 10.7 (L) 10/08/2024   HCT 34.1 (L) 10/08/2024   MCV 99.7 10/08/2024   MCH 31.3 10/08/2024   PLT 237 10/08/2024   MCHC 31.4 10/08/2024  RDW 15.4 10/08/2024   LYMPHSABS 0.7 09/28/2024   MONOABS 1.2 (H) 09/28/2024   EOSABS 0.0 09/28/2024   BASOSABS 0.0 09/28/2024     Last metabolic panel Lab Results  Component Value Date   NA 139 10/08/2024   K 4.3 10/08/2024   CL 104 10/08/2024   CO2 28 10/08/2024   BUN 14 10/08/2024   CREATININE 0.44 10/08/2024   GLUCOSE 193 (H) 10/08/2024   GFRNONAA >60 10/08/2024   GFRAA >60 11/10/2017   CALCIUM  8.8 (L) 10/08/2024   PROT 6.0 (L) 10/08/2024   ALBUMIN 3.3 (L) 10/08/2024   BILITOT 0.7 10/08/2024   ALKPHOS 113 10/08/2024   AST 16 10/08/2024   ALT 10 10/08/2024   ANIONGAP 7 10/08/2024    CBG (last 3)  Recent Labs    10/07/24 1826 10/07/24 2225 10/08/24 0727  GLUCAP 123* 183* 189*      Coagulation Profile: No results for input(s): INR, PROTIME in the last 168 hours.    Radiology Studies: I have personally reviewed the imaging studies  No results found.     Nydia Distance M.D. [facility] 10/08/2024, 10:58 AM  Available via Epic secure chat 7am-7pm After 7 pm, please refer to night coverage provider listed on amion.

## 2024-10-08 NOTE — Plan of Care (Signed)
  Problem: Fluid Volume: Goal: Ability to maintain a balanced intake and output will improve Outcome: Not Applicable   Problem: Activity: Goal: Risk for activity intolerance will decrease Outcome: Not Applicable

## 2024-10-08 NOTE — Progress Notes (Signed)
 Hypoglycemic Event  CBG: 67  Treatment: Patient refused to drink orange juice but ate a container of chocolate ice cream.   Symptoms: None   Follow-up CBG: Time:1629  CBG Result:119  Possible Reasons for Event: Patient's poor appetite  MD notified: No new orders at this time.     Doris Wyatt

## 2024-10-09 ENCOUNTER — Telehealth (HOSPITAL_COMMUNITY): Payer: Self-pay

## 2024-10-09 ENCOUNTER — Other Ambulatory Visit (HOSPITAL_COMMUNITY): Payer: Self-pay

## 2024-10-09 DIAGNOSIS — I152 Hypertension secondary to endocrine disorders: Secondary | ICD-10-CM

## 2024-10-09 DIAGNOSIS — S72342D Displaced spiral fracture of shaft of left femur, subsequent encounter for closed fracture with routine healing: Secondary | ICD-10-CM

## 2024-10-09 DIAGNOSIS — E119 Type 2 diabetes mellitus without complications: Secondary | ICD-10-CM

## 2024-10-09 DIAGNOSIS — E1159 Type 2 diabetes mellitus with other circulatory complications: Secondary | ICD-10-CM | POA: Diagnosis not present

## 2024-10-09 DIAGNOSIS — F411 Generalized anxiety disorder: Secondary | ICD-10-CM | POA: Diagnosis not present

## 2024-10-09 LAB — GLUCOSE, CAPILLARY
Glucose-Capillary: 198 mg/dL — ABNORMAL HIGH (ref 70–99)
Glucose-Capillary: 375 mg/dL — ABNORMAL HIGH (ref 70–99)
Glucose-Capillary: 261 mg/dL — ABNORMAL HIGH (ref 70–99)
Glucose-Capillary: 137 mg/dL — ABNORMAL HIGH (ref 70–99)

## 2024-10-09 MED ORDER — DOCUSATE SODIUM 100 MG PO CAPS: 100.0000 mg | ORAL_CAPSULE | Freq: Two times a day (BID) | ORAL | Status: DC | PRN

## 2024-10-09 MED ORDER — PANTOPRAZOLE SODIUM 40 MG PO TBEC: 40.0000 mg | DELAYED_RELEASE_TABLET | Freq: Every day | ORAL | Status: DC

## 2024-10-09 MED ORDER — ADULT MULTIVITAMIN W/MINERALS CH: 1.0000 | ORAL_TABLET | Freq: Every day | ORAL | Status: DC

## 2024-10-09 MED ORDER — POLYETHYLENE GLYCOL 3350 17 G PO PACK: 17.0000 g | PACK | Freq: Every day | ORAL | 0 refills | Status: DC | PRN

## 2024-10-09 NOTE — Plan of Care (Signed)
   Problem: Coping: Goal: Level of anxiety will decrease Outcome: Progressing   Problem: Pain Managment: Goal: General experience of comfort will improve and/or be controlled Outcome: Progressing   Problem: Safety: Goal: Ability to remain free from injury will improve Outcome: Progressing

## 2024-10-09 NOTE — Progress Notes (Signed)
 [facility]                                                                               Doris Wyatt, is a 78 y.o. female, DOB - [birth year], FMW:[id number] Admit date - 09/28/2024    Outpatient Primary MD for the patient is [name], MD  LOS - 11  days    Brief summary   Patient is a 78 year old female with COPD, diverticulosis,/diverticulitis with perforation, nephrolithiasis, anxiety, depression, hyperlipidemia, hypertension, Alzheimer's disease, type 2 diabetes, who had recently undergone a left femur intramedullary nail insertion 3 weeks ago and was discharged to SNF presented to the ED after sustaining a unwitnessed fall 2 days back with pain on the left hip.  In the ED, vitals were stable.  Blood pressure slightly elevated.  Labs were unremarkable.  Hemoglobin was 11.3.  X-ray of the left femur showed mildly displaced spiral fracture of the mid femoral shaft with evidence of open reduction and internal fixation of recent intertrochanteric fracture with orthopedic screws.  Orthopedics was consulted. - Status post IM nailing of the left humerus on 09/29/2024.    Awaiting placement  Assessment & Plan    Assessment and Plan:   Mechanical fall with left femoral shaft fracture  - Status post IM nailing of the left humerus on 09/29/2024.  - Weightbearing as tolerated as per orthopedics, recommended Lovenox subcu injection for DVT prophylaxis for 4 weeks..  -  Physical therapy has recommended skilled nursing facility placement.  -  Follow-up with [facility], Dr. Reyne for 2 weeks for wound check. - No acute issues, awaiting SNF   Essential hypertension Well controlled.  -Continue Avapro .       COPD (chronic obstructive pulmonary disease) - Stable, no wheezing, continue bronchodilators      Hyperlipidemia  -Continue Lipitor     Type 2 diabetes mellitus with hyperglycemia, uncontrolled -Hemoglobin A1c 9.0 on 09/10/2024 CBG (last 3)   Recent Labs    10/08/24 2346 10/09/24 0845 10/09/24 1207  GLUCAP 144* 198* 375*   - Continue SSI sensitive, Semglee 13 units daily for now.      Osteoporosis Follow-up with PCP as outpatient   Alzheimer's dementia with sundowning - Continue memantine  and divalproex  . -    Underweight Nutrition Problem: Severe Malnutrition Etiology: chronic illness (COPD; dementia) Recommend feed with assistance.    Signs/Symptoms: severe fat depletion, severe muscle depletion Interventions: Refer to RD note for recommendations, Glucerna shake, MVI Estimated body mass index is 17.32 kg/m as calculated from the following:   Height as of this encounter: 5' 5 (1.651 m).   Weight as of this encounter: 47.2 kg.     Urinary retention:  -s/p in and out cath today.    RN Pressure Injury Documentation:    Malnutrition Type:  Nutrition Problem: Severe Malnutrition Etiology: chronic illness (COPD; dementia)   Malnutrition Characteristics:  Signs/Symptoms: severe fat depletion, severe muscle depletion   Nutrition Interventions:  Interventions: Refer to RD note for recommendations, Glucerna shake, MVI  Estimated body mass index is 17.32 kg/m as calculated from the following:   Height as of this encounter: 5' 5 (1.651 m).   Weight  as of this encounter: 47.2 kg.  Code Status: full code.  DVT Prophylaxis:  enoxaparin (LOVENOX) injection 40 mg Start: 09/30/24 1000 SCDs Start: 09/29/24 1418   Level of Care: Level of care: Telemetry Family Communication:   Disposition Plan:     Remains inpatient appropriate:  pending placement.   Procedures:  S/p IM nailing of the left humerus on 10/31  Consultants:   orthopedics  Antimicrobials:   Anti-infectives (From admission, onward)    Start     Dose/Rate Route Frequency Ordered Stop   09/30/24 1200  ceFAZolin  (ANCEF ) IVPB 2g/100 mL premix        2 g 200 mL/hr over 30 Minutes Intravenous On call to O.R. 09/29/24 1117 09/30/24 2042    09/29/24 1700  ceFAZolin  (ANCEF ) IVPB 2g/100 mL premix        2 g 200 mL/hr over 30 Minutes Intravenous Every 6 hours 09/29/24 1417 09/30/24 0447   09/29/24 1141  ceFAZolin  (ANCEF ) 2-4 GM/100ML-% IVPB       Note to Pharmacy: Chauncey Dace D: cabinet override      09/29/24 1141 09/29/24 1338        Medications  Scheduled Meds:  aspirin EC  81 mg Oral QPM   atorvastatin   40 mg Oral Daily   bisacodyl  5 mg Oral Daily   divalproex   125 mg Oral BID   docusate sodium   100 mg Oral BID   enoxaparin (LOVENOX) injection  40 mg Subcutaneous Q24H   feeding supplement (GLUCERNA SHAKE)  237 mL Oral TID BM   insulin  aspart  0-9 Units Subcutaneous TID WC   insulin  glargine-yfgn  13 Units Subcutaneous Daily   irbesartan   75 mg Oral Daily   lidocaine   1 patch Transdermal Q24H   memantine   10 mg Oral BID   multivitamin with minerals  1 tablet Oral Daily   pantoprazole  40 mg Oral Daily   polyethylene glycol  17 g Oral Daily   Continuous Infusions: PRN Meds:.acetaminophen  **OR** acetaminophen , HYDROcodone -acetaminophen , HYDROmorphone (DILAUDID) injection, ondansetron  **OR** ondansetron  (ZOFRAN ) IV    Subjective:   Doris Wyatt was seen and examined today.  No new complaints. Patient's daughter in law at bedside and concerned about her not eating well.   Objective:   Vitals:   10/08/24 1310 10/08/24 2340 10/09/24 0526 10/09/24 1339  BP: (!) 143/64 (!) 146/60 (!) 163/71 (!) 136/40  Pulse: 77 80 84 82  Resp: 16 18 18 17   Temp: 98.4 F (36.9 C) 97.6 F (36.4 C) 98.5 F (36.9 C) 98.3 F (36.8 C)  TempSrc: Oral   Oral  SpO2: 99% 97% 98% 97%  Weight:      Height:        Intake/Output Summary (Last 24 hours) at 10/09/2024 1350 Last data filed at 10/09/2024 1314 Gross per 24 hour  Intake 180 ml  Output --  Net 180 ml   Filed Weights   09/29/24 1012  Weight: 47.2 kg     Exam General exam: Appears calm and comfortable  Respiratory system: Clear to auscultation.  Respiratory effort normal. Cardiovascular system: S1 & S2 heard, RRR.  Gastrointestinal system: Abdomen is nondistended, soft and nontender.  Central nervous system: alert,  Extremities: Symmetric 5 x 5 power. Skin: No rashes,  Psychiatry:  Mood & affect appropriate.     Data Reviewed:  I have personally reviewed following labs and imaging studies   CBC Lab Results  Component Value Date   WBC 6.1 10/08/2024   RBC 3.42 (  L) 10/08/2024   HGB 10.7 (L) 10/08/2024   HCT 34.1 (L) 10/08/2024   MCV 99.7 10/08/2024   MCH 31.3 10/08/2024   PLT 237 10/08/2024   MCHC 31.4 10/08/2024   RDW 15.4 10/08/2024   LYMPHSABS 0.7 09/28/2024   MONOABS 1.2 (H) 09/28/2024   EOSABS 0.0 09/28/2024   BASOSABS 0.0 09/28/2024     Last metabolic panel Lab Results  Component Value Date   NA 139 10/08/2024   K 4.3 10/08/2024   CL 104 10/08/2024   CO2 28 10/08/2024   BUN 14 10/08/2024   CREATININE 0.44 10/08/2024   GLUCOSE 193 (H) 10/08/2024   GFRNONAA >60 10/08/2024   GFRAA >60 11/10/2017   CALCIUM  8.8 (L) 10/08/2024   PROT 6.0 (L) 10/08/2024   ALBUMIN 3.3 (L) 10/08/2024   BILITOT 0.7 10/08/2024   ALKPHOS 113 10/08/2024   AST 16 10/08/2024   ALT 10 10/08/2024   ANIONGAP 7 10/08/2024    CBG (last 3)  Recent Labs    10/08/24 2346 10/09/24 0845 10/09/24 1207  GLUCAP 144* 198* 375*      Coagulation Profile: No results for input(s): INR, PROTIME in the last 168 hours.   Radiology Studies: No results found.     Elgie Butter M.D. [facility] 10/09/2024, 1:50 PM  Available via Epic secure chat 7am-7pm After 7 pm, please refer to night coverage provider listed on amion.

## 2024-10-09 NOTE — Inpatient Diabetes Management (Addendum)
 Inpatient Diabetes Program Recommendations  AACE/ADA: New Consensus Statement on Inpatient Glycemic Control (2015)  Target Ranges:  Prepandial:   less than 140 mg/dL      Peak postprandial:   less than 180 mg/dL (1-2 hours)      Critically ill patients:  140 - 180 mg/dL   Lab Results  Component Value Date   GLUCAP 198 (H) 10/09/2024   HGBA1C 9.0 (H) 09/10/2024    Review of Glycemic Control  Latest Reference Range & Units 10/07/24 22:25 10/08/24 07:27 10/08/24 11:16 10/08/24 16:03 10/08/24 16:29 10/08/24 23:46 10/09/24 08:45  Glucose-Capillary 70 - 99 mg/dL 816 (H) 810 (H) 762 (H) 67 (L) 119 (H) 144 (H) 198 (H)  (H): Data is abnormally high (L): Data is abnormally low Diabetes history: DM2 Outpatient Diabetes medications: Humalog 0-12 TID, Lantus 10 QAM Current orders for Inpatient glycemic control: Semglee 10 daily, Novolog  0-9 TID with meals    HgbA1C - 9.0% Post-prandials elevated    Inpatient Diabetes Program Recommendations:    Consider reducing correction to Novolog  0-6 units TID. Will plan to speak with patient and family.  Addendum: Spoke with patient and patient's son regarding outpatient diabetes management. Anticipate SNF rehab at discharge and need for insulin . Reviewed patient's current A1c of 9.0%. Explained what a A1c is and what it measures. Also reviewed goal A1c with patient, importance of good glucose control @ home, and blood sugar goals.  Doris Wyatt, patient's daughter is requesting the teaching in the event patient will return to her home following SNF rehab.  Doris Wyatt not at bedside; attempted to call no answer. Will reattempt.   Addendum @1330  Doris Wyatt on way to hospital. Will plan to see. @1540 : Spoke with Doris Wyatt and granddaughter.  Educated patient on insulin  pen use at home. Reviewed contents of insulin  flexpen starter kit. Reviewed all steps of insulin  pen including attachment of needle, 2-unit air shot, dialing up dose, giving injection, removing  needle, disposal of sharps, storage of unused insulin , disposal of insulin  etc. Patient's daughter able to provide successful return demonstration. Also reviewed troubleshooting with insulin  pen. MD to give patient Rxs for insulin  pens and insulin  pen needles. Additionally, patient has used Dexcom in the past and is interested in CGM. Once insulin  provided assuming coverage will improve. RPH contacted to determine. CGM provided to patient's daughter for application following SNF rehab.  Would not consider resuming Glimepiride at discharge.  Thanks, Doris Minus, MSN, RNC-OB Diabetes Coordinator [phone] (8a-5p)

## 2024-10-09 NOTE — Progress Notes (Signed)
 Physical Therapy Treatment Patient Details Name: Doris Wyatt MRN: [id number] DOB: June 20, 1946 Today's Date: 10/09/2024   History of Present Illness Pt is 78 yo female admitted with L femoral shaft fracture on 09/28/24 and S/P ORIF 10/31. Pt just recently had another L hip fx with IM rod insertion on 09/08/24. Hx of Alz, DM, COPD, MDD, back surgery, osteoporosis, falls.    PT Comments  Pt needs increased time and cues but made excellent progress today.  Pt with good participation and effort improving transfers to min-mod A and ambulated 12' with RW.  Does benefit from +2 assist for safety and pain control, although moved much better today and appeared to have improved pain control.  Cont POC. Patient will benefit from continued inpatient follow up therapy, <3 hours/day at d/c.     If plan is discharge home, recommend the following: A lot of help with walking and/or transfers;A lot of help with bathing/dressing/bathroom;Assistance with cooking/housework;Help with stairs or ramp for entrance   Can travel by private vehicle     Yes  Equipment Recommendations  Wheelchair cushion (measurements PT);Wheelchair (measurements PT);Rolling walker (2 wheels)    Recommendations for Other Services       Precautions / Restrictions Precautions Precautions: Fall Restrictions LLE Weight Bearing Per Provider Order: Weight bearing as tolerated     Mobility  Bed Mobility Overal bed mobility: Needs Assistance Bed Mobility: Supine to Sit     Supine to sit: Min assist, +2 for physical assistance     General bed mobility comments: Good effort from pt in using bil L to scoot hips to EOB, reaching to pull up on therapist and then scooting forward with cues.  Did need some min A to lift trunk and for L LE    Transfers Overall transfer level: Needs assistance Equipment used: Rolling walker (2 wheels) Transfers: Sit to/from Stand Sit to Stand: Mod assist, +2 safety/equipment            General transfer comment: Cues for hand placement and L LE management with light mod A to stand and increased time.  Assist for hand placement and L LE management with sitting.  Pt very fearful with return to sit.  Frequent cues for relaxation    Ambulation/Gait Ambulation/Gait assistance: Min assist, +2 physical assistance Gait Distance (Feet): 12 Feet Assistive device: Rolling walker (2 wheels) Gait Pattern/deviations: Step-to pattern, Decreased stride length, Decreased weight shift to left Gait velocity: decreased     General Gait Details: Step to L gait with increased time and cues. Pt requiring min A for stability and to advance RW.   Stairs             Wheelchair Mobility     Tilt Bed    Modified Rankin (Stroke Patients Only)       Balance Overall balance assessment: Needs assistance Sitting-balance support: No upper extremity supported, Feet supported Sitting balance-Leahy Scale: Good     Standing balance support: Bilateral upper extremity supported, During functional activity, Reliant on assistive device for balance Standing balance-Leahy Scale: Poor Standing balance comment: RW and min A                            Communication    Cognition Arousal: Alert Behavior During Therapy: Anxious   PT - Cognitive impairments: History of cognitive impairments  PT - Cognition Comments: AxO x 1 ; fearful and anxious but able to follow commands and participate with cues for seqencing and relaxation; son present and encouraging Following commands: Impaired Following commands impaired: Only follows one step commands consistently    Cueing Cueing Techniques: Verbal cues, Gestural cues, Tactile cues, Visual cues  Exercises      General Comments General comments (skin integrity, edema, etc.): VSS with transfers (monitored for orthostatic BP -syncopal episdoe last admission)      Pertinent Vitals/Pain Pain  Assessment Pain Assessment: Faces Faces Pain Scale: Hurts little more Pain Location: LLE with movement Pain Descriptors / Indicators: Grimacing, Guarding Pain Intervention(s): Limited activity within patient's tolerance, Monitored during session, Repositioned, Other (comment) (Pt comfortable at rest, trying to avoid pain meds if possible for cogntiion -son present and agrees)    Home Living Family/patient expects to be discharged to:: Skilled nursing facility Living Arrangements: Children                      Prior Function            PT Goals (current goals can now be found in the care plan section) Progress towards PT goals: Progressing toward goals    Frequency    Min 2X/week      PT Plan      Co-evaluation              AM-PAC PT 6 Clicks Mobility   Outcome Measure  Help needed turning from your back to your side while in a flat bed without using bedrails?: A Lot Help needed moving from lying on your back to sitting on the side of a flat bed without using bedrails?: A Lot Help needed moving to and from a bed to a chair (including a wheelchair)?: A Lot Help needed standing up from a chair using your arms (e.g., wheelchair or bedside chair)?: A Lot Help needed to walk in hospital room?: A Lot Help needed climbing 3-5 steps with a railing? : Total 6 Click Score: 11    End of Session Equipment Utilized During Treatment: Gait belt Activity Tolerance: Patient tolerated treatment well Patient left: with call bell/phone within reach;with chair alarm set;in chair;with family/visitor present Nurse Communication: Mobility status PT Visit Diagnosis: Difficulty in walking, not elsewhere classified (R26.2);Muscle weakness (generalized) (M62.81);History of falling (Z91.81);Pain Pain - Right/Left: Left Pain - part of body: Hip;Leg     Time: 8855-8789 PT Time Calculation (min) (ACUTE ONLY): 26 min  Charges:    $Gait Training: 8-22 mins $Therapeutic  Activity: 8-22 mins PT General Charges $$ ACUTE PT VISIT: 1 Visit                     Doris, PT Acute Rehab [facility] [phone]    Doris Wyatt 10/09/2024, 12:42 PM

## 2024-10-09 NOTE — Progress Notes (Signed)
 Attempted to feed patient multiple times throughout the day but patient refuses. She will only drink the Glucerna shakes. MD aware.

## 2024-10-10 ENCOUNTER — Other Ambulatory Visit (HOSPITAL_COMMUNITY): Payer: Self-pay

## 2024-10-10 DIAGNOSIS — S72392A Other fracture of shaft of left femur, initial encounter for closed fracture: Secondary | ICD-10-CM

## 2024-10-10 LAB — GLUCOSE, CAPILLARY
Glucose-Capillary: 184 mg/dL — ABNORMAL HIGH (ref 70–99)
Glucose-Capillary: 218 mg/dL — ABNORMAL HIGH (ref 70–99)
Glucose-Capillary: 241 mg/dL — ABNORMAL HIGH (ref 70–99)
Glucose-Capillary: 105 mg/dL — ABNORMAL HIGH (ref 70–99)

## 2024-10-10 MED ORDER — LANTUS SOLOSTAR 100 UNIT/ML ~~LOC~~ SOPN: 13.0000 [IU] | PEN_INJECTOR | Freq: Every morning | SUBCUTANEOUS | Status: DC

## 2024-10-10 NOTE — Assessment & Plan Note (Addendum)
 Continue valsartan

## 2024-10-10 NOTE — Assessment & Plan Note (Signed)
 Needs consideration of bisphosphonate therapy with PCP

## 2024-10-10 NOTE — Progress Notes (Signed)
-----------------------------------------------------------  [facility]--------------------------------------------------- --------------------------------------------------------D(Data) A(Action) R(response) Note------------------------------------------------  Patient Name: Doris Wyatt Patient DOB: 1946-06-05 Date: 10/10/2024     Data: Received message from Dr Barbarann regarding patient awaiting placement.     Action: Chart reviewed. TOC saw patient on 11/7. No official TOC consult in orders. Added last TOC CM Alfonse RN to message thread requesting update on SNF or to refer to [facility] working with patient.    Response: Alfonse RN reaching out to family for decision.     ALEC Lim, RN [facility]

## 2024-10-10 NOTE — Assessment & Plan Note (Addendum)
 A1c 9, poor control Continue glargine Cover with sensitive-scale SSI Carb modified diet

## 2024-10-10 NOTE — Discharge Summary (Addendum)
 Physician Discharge Summary   Patient: Doris Wyatt MRN: [id number] DOB: 29-Nov-1946  Admit date:     09/28/2024  Discharge date: 10/13/2024  Discharge Physician: Burnard Cunning, DO   PCP: [name], MD   Recommendations at discharge:   You are being discharged to short-term rehabilitation Follow up with Dr. Delayne after discharge from rehab Follow up with Dr. Reyne in 2 weeks You will need to discuss osteoporosis medication options at that visit Follow pending urine culture results   Discharge Diagnoses: Principal Problem:   Left femoral shaft fracture (HCC) Active Problems:   Abnormal urinalysis   Hypertension associated with diabetes (HCC)   COPD (chronic obstructive pulmonary disease)   Type 2 diabetes mellitus (HCC)   Hyperlipidemia associated with type 2 diabetes mellitus (HCC)   Generalized anxiety disorder   Osteoporosis   Major neurocognitive disorder due to Alzheimer's disease   Protein-calorie malnutrition, severe    Hospital Course: 77yo with h/o COPD, anxiety/depression, HTN, HLD, Alzheimer's dementia, and T2DM who presented on 10/30 with a fall.  She had previously had a fall with hip fracture and was hospitalized from 10/9-16 and discharged to SNF s/p L femur intramedullary rod insertion.  This time she had a femoral shaft fracture and underwent ORIF and nail insertion again on 10/30.  Hospital course prolonged while awaiting SNF placement for rehab.  Assessment and Plan:  Assessment & Plan Left femoral shaft fracture (HCC) Initial intertrochanteric fracture s/p ORIF on 10/13 She was discharged to SNF [facility]) and had another unwitnessed fall on 10/28 She returned on 10/30 with hip pain and was found to have a femoral shaft fracture  S/p additional repair on 10/30 Has been stable awaiting SNF placement.   Prior discharge on 11/11 appealed.   SNF placement has now been obtained.  Pt stable to discharge to SNF for rehab  today.  Hypertension associated with diabetes (HCC) Continue valsartan   Added low dose amlodipine  due to elevated BP's on ARB alone PCP follow up  Monitor BP's & titrate regimen COPD (chronic obstructive pulmonary disease) Continue albuterol  prn No on home O2 Compensated at this time Hyperlipidemia associated with type 2 diabetes mellitus (HCC) Continue atorvastatin  Type 2 diabetes mellitus (HCC) A1c 9, poor control Continue glargine - increased to 16 units daily Cover with sensitive-scale sliding scale Novolog  0-9 units Carb modified diet  PCP follow up Generalized anxiety disorder No longer taking mirtazepine Osteoporosis Needs consideration of bisphosphonate therapy with PCP Major neurocognitive disorder due to Alzheimer's disease Continue memantine  and divalproex  No longer taking donepezil  Protein-calorie malnutrition, severe Nutrition Problem: Severe Malnutrition Etiology: chronic illness (COPD; dementia) Signs/Symptoms: severe fat depletion, severe muscle depletion Interventions: Refer to RD note for recommendations, Glucerna shake, MVI Abnormal urinalysis Nursing noted malodorous urine and signs of pt discomfort voiding.  UA showed moderate leukocytes.  Pt afebrile. --Start empiric Macrobid  100 mg BID x 5 days --Urine culture results are pending - follow   UTI ruled in with urine culture growing klebsiellapneumoniae   Consultants: Orthopedics Nutrition PT OT TOC team  Procedures: ORIF and IM nail of L femur 10/31  Antibiotics: Cefazolin  x 6 doses   Pain control - [place]  Controlled Substance Reporting System database was reviewed. and patient was instructed, not to drive, operate heavy machinery, perform activities at heights, swimming or participation in water activities or provide baby-sitting services while on Pain, Sleep and Anxiety Medications; until their outpatient Physician has advised to do so again. Also recommended to not to take more  than  prescribed Pain, Sleep and Anxiety Medications.   Disposition: Skilled nursing facility Diet recommendation:  Regular diet DISCHARGE MEDICATION: Allergies as of 10/13/2024   No Known Allergies      Medication List     STOP taking these medications    donepezil  23 MG Tabs tablet Commonly known as: ARICEPT    ferrous sulfate  325 (65 FE) MG EC tablet   HYDROcodone -acetaminophen  5-325 MG tablet Commonly known as: NORCO/VICODIN   Remeron  15 MG tablet Generic drug: mirtazapine    traMADol  50 MG tablet Commonly known as: ULTRAM        TAKE these medications    acetaminophen  325 MG tablet Commonly known as: TYLENOL  Take 2 tablets (650 mg total) by mouth every 6 (six) hours as needed for mild pain (or Fever >/= 101). What changed: when to take this   albuterol  108 (90 Base) MCG/ACT inhaler Commonly known as: VENTOLIN  HFA Inhale 2 puffs into the lungs every 6 (six) hours as needed for wheezing or shortness of breath.   amLODipine  2.5 MG tablet Commonly known as: NORVASC  Take 1 tablet (2.5 mg total) by mouth daily.   aspirin  EC 81 MG tablet Take 81 mg by mouth at bedtime. Swallow whole.   atorvastatin  40 MG tablet Commonly known as: LIPITOR Take 40 mg by mouth at bedtime.   bisacodyl  5 MG EC tablet Commonly known as: DULCOLAX Take 5 mg by mouth in the morning.   dicyclomine 20 MG tablet Commonly known as: BENTYL Take 20 mg by mouth 3 (three) times daily before meals.   divalproex  125 MG DR tablet Commonly known as: DEPAKOTE  Take 1 tablet (125 mg total) by mouth 2 (two) times daily.   docusate sodium  100 MG capsule Commonly known as: COLACE Take 1 capsule (100 mg total) by mouth 2 (two) times daily as needed for mild constipation.   enoxaparin  30 MG/0.3ML injection Commonly known as: LOVENOX  Inject 0.3 mLs (30 mg total) into the skin daily.   GLUCERNA PO Take 1 Can by mouth See admin instructions. Glucerna LACTOSE-FREE - Drink 1 can by mouth two times a  day between meals   HumaLOG 100 UNIT/ML injection Generic drug: insulin  lispro Inject 0-12 Units into the skin See admin instructions. Inject 0-12 units into the skin three times a day before meals and at bedtime, per sliding scale: BGL less than 70 = CALL NP or PA; 70-200 = give nothing; 201-250 = 2 units; 251-300 = 4 units; 301-350 = 6 units; 351-400 = 8 units; 401-450 = 10 units; 451-600 = 12 units AND re-check in 2 hours. If  >350 or <100 at that time, notify the provider.   insulin  aspart 100 UNIT/ML injection Commonly known as: novoLOG  Inject 0-9 Units into the skin 3 (three) times daily with meals. Take 0-9 units 3 times daily with meals as follows: If CBG 70-120: 0 units If CBG 121-150: 1 unit If CBG 151-200: 2 units If CBG 201-250: 3 units If CBG 251-300: 5 units If CBG 301-350: 7 units If CBG 351-400: 9 units If CBG over 400: 12 units and call MD   Lantus  SoloStar 100 UNIT/ML Solostar Pen Generic drug: insulin  glargine Inject 16 Units into the skin in the morning. What changed: how much to take   lidocaine  4 % Place 1 patch onto the skin See admin instructions. Apply 1 patch to the left hip in the morning at remove at bedtime   memantine  10 MG tablet Commonly known as: NAMENDA  TAKE 1 TABLET BY  MOUTH TWICE A DAY   multivitamin with minerals Tabs tablet Take 1 tablet by mouth daily.   nitrofurantoin  (macrocrystal-monohydrate) 100 MG capsule Commonly known as: MACROBID  Take 1 capsule (100 mg total) by mouth every 12 (twelve) hours.   pantoprazole  40 MG tablet Commonly known as: PROTONIX  Take 1 tablet (40 mg total) by mouth daily.   polyethylene glycol 17 g packet Commonly known as: MIRALAX  / GLYCOLAX  Take 17 g by mouth daily as needed.   valsartan  80 MG tablet Commonly known as: DIOVAN  Take 80 mg by mouth daily.               Discharge Care Instructions  (From admission, onward)           Start     Ordered   10/10/24 0000  Discharge wound care:        Comments: Keep dressing clean, dry, and intact until orthopedic follow up appointment   10/10/24 1119            Contact information for follow-up providers     Madden, Evan J, MD Follow up in 1 week(s).   Specialty: Family Medicine Contact information: [address] [place] [address] [phone]         Reyne Cordella SQUIBB, MD Follow up in 2 week(s).   Specialty: Orthopedic Surgery Contact information: [address] [place] [address] [phone]              Contact information for after-discharge care     Destination     [facility] .   Service: Skilled Nursing Contact information: [address] [place]  [phone]                       Subjective: Patient awake sitting up in bed when seen on rounds this morning.  She denies any complaints.  Breakfast tray had just been dropped off and I asked if she needed assistance sitting up to eat breakfast.  She stated no that she was not hungry.  No family present at time of my visit.  11/14 -- pt doing well, denies complaints.  Remains medically stable for discharge to SNF.    Discharge Exam:  General exam: awake, alert, no acute distress, pleasantly confused HEENT: moist mucus membranes, hearing grossly normal  Respiratory system: CTAB, no wheezes, rales or rhonchi, normal respiratory effort. Cardiovascular system: normal S1/S2, RRR, no pedal edema.   Gastrointestinal system: soft, NT, ND, no HSM felt, +bowel sounds. Central nervous system: A&O x self. no gross focal neurologic deficits, normal speech Extremities: left lateral thigh dressing intact, no edema, normal tone Skin: dry, intact, normal temperature Psychiatry: normal mood, congruent affect       Objective: Vitals:   10/13/24 0632 10/13/24 0827  BP: 124/66 132/64  Pulse: 87   Resp: 14   Temp: 99.6 F (37.6 C)   SpO2: 96%     Intake/Output Summary (Last 24 hours) at  10/13/2024 0840 Last data filed at 10/13/2024 9360 Gross per 24 hour  Intake 250 ml  Output 250 ml  Net 0 ml   Filed Weights   09/29/24 1012  Weight: 47.2 kg      Data Reviewed: I have reviewed the patient's lab results since admission.  Pertinent labs for today include:   None recently    Condition at discharge: stable  The results of significant diagnostics from this hospitalization (including imaging, microbiology, ancillary and laboratory) are listed below  for reference.   Imaging Studies: DG Abd 1 View Result Date: 10/01/2024 EXAM: 1 View Xray Of The Abdomen 10/01/2024 10:28:00 AM COMPARISON: None available. CLINICAL HISTORY: Abdominal pain FINDINGS: BOWEL: Moderate colonic stool burden diffusely throughout the colon. Nonobstructive bowel gas pattern. SOFT TISSUES: Vascular calcifications. BONES: Lower lumbar fusion hardware noted. No acute osseous abnormality. IMPRESSION: 1. No acute findings. 2. Moderate colonic stool burden. Electronically signed by: Norleen Kil MD 10/01/2024 10:34 AM EST RP Workstation: [id number]   DG FEMUR MIN 2 VIEWS LEFT Result Date: 09/29/2024 EXAM: 2 VIEW(S) XRAY OF THE LEFT FEMUR 09/29/2024 01:52:00 PM COMPARISON: 09/28/2024 CLINICAL HISTORY: 886218 Surgery, elective 886218 Surgery, elective 886218 FINDINGS: BONES AND JOINTS: Intraoperative spot images demonstrate placement of intramedullary rod across the mid left femoral fracture. No hardware complicating feature. SOFT TISSUES: The soft tissues are unremarkable. IMPRESSION: 1. Placement of intramedullary rod across the mid left femoral fracture without hardware complication. Electronically signed by: Franky Crease MD 09/29/2024 07:30 PM EDT RP Workstation: [id number]   DG C-Arm 1-60 Min-No Report Result Date: 09/29/2024 Fluoroscopy was utilized by the requesting physician.  No radiographic interpretation.   Chest Portable 1 View Result Date: 09/28/2024 EXAM: 1 VIEW(S) XRAY OF THE CHEST 09/28/2024  03:25:00 PM COMPARISON: 09/07/2024 CLINICAL HISTORY: Preop cardiovascular exam FINDINGS: LUNGS AND PLEURA: Hyperinflated lungs. No focal pulmonary opacity. No pulmonary edema. No pleural effusion. No pneumothorax. HEART AND MEDIASTINUM: Aortic atherosclerosis. No acute abnormality of the cardiac and mediastinal silhouettes. BONES AND SOFT TISSUES: Remote right rib fracture. No acute osseous abnormality. IMPRESSION: 1. No acute cardiopulmonary findings. Emphysema. Electronically signed by: Norman Gatlin MD 09/28/2024 03:39 PM EDT RP Workstation: [id number]   DG Femur Min 2 Views Left Result Date: 09/28/2024 EXAM: 2 VIEW(S) XRAY OF THE LEFT FEMUR 09/28/2024 12:17:00 PM COMPARISON: Left femur series dated 09/08/2024 and 09/07/2024. CLINICAL HISTORY: fall fall FINDINGS: BONES AND JOINTS: Spiral mildly displaced fracture of the mid femoral shaft. Status post open reduction and internal fixation of a recent intertrochanteric fracture with an orthopedic screw within the femoral head and neck and anchoring rod extending the length of the femoral shaft. No joint dislocation. SOFT TISSUES: There are vascular calcifications present. IMPRESSION: 1. Spiral mildly displaced fracture of the mid femoral shaft, developed since prior studies. 2. Status post open reduction and internal fixation of a recent intertrochanteric fracture with an orthopedic screw within the femoral head and neck and anchoring rod extending the length of the femoral shaft. Electronically signed by: Evalene Coho MD 09/28/2024 12:38 PM EDT RP Workstation: [id number]    Microbiology: Results for orders placed or performed during the hospital encounter of 09/28/24  Surgical pcr screen     Status: None   Collection Time: 09/28/24  5:43 PM   Specimen: Nasal Mucosa; Nasal Swab  Result Value Ref Range Status   MRSA, PCR NEGATIVE NEGATIVE Final   Staphylococcus aureus NEGATIVE NEGATIVE Final    Comment: (NOTE) The Xpert SA Assay (FDA approved for  NASAL specimens in patients 47 years of age and older), is one component of a comprehensive surveillance program. It is not intended to diagnose infection nor to guide or monitor treatment. Performed at [facility], [address] [place], [place] [address]     Labs: CBC: Recent Labs  Lab 10/08/24 0636  WBC 6.1  HGB 10.7*  HCT 34.1*  MCV 99.7  PLT 237   Basic Metabolic Panel: Recent Labs  Lab 10/08/24 0636  NA 139  K 4.3  CL 104  CO2  28  GLUCOSE 193*  BUN 14  CREATININE 0.44  CALCIUM  8.8*   Liver Function Tests: Recent Labs  Lab 10/08/24 0636  AST 16  ALT 10  ALKPHOS 113  BILITOT 0.7  PROT 6.0*  ALBUMIN 3.3*   CBG: Recent Labs  Lab 10/12/24 0750 10/12/24 1218 10/12/24 1538 10/12/24 2049 10/13/24 0753  GLUCAP 205* 234* 145* 152* 136*    Discharge time spent: greater than 30 minutes.  Signed: Burnard DELENA Cunning, DO [facility] 10/13/2024

## 2024-10-10 NOTE — Assessment & Plan Note (Addendum)
 Continue albuterol  prn No on home O2 Compensated at this time

## 2024-10-10 NOTE — Assessment & Plan Note (Addendum)
 Continue memantine  and divalproex  No longer taking donepezil 

## 2024-10-10 NOTE — Assessment & Plan Note (Addendum)
 Nutrition Problem: Severe Malnutrition Etiology: chronic illness (COPD; dementia) Signs/Symptoms: severe fat depletion, severe muscle depletion Interventions: Refer to RD note for recommendations, Glucerna shake, MVI

## 2024-10-10 NOTE — Telephone Encounter (Signed)
 Pharmacy Patient Advocate Encounter  Received notification from [employer] that Prior Authorization for FreeStyle Libre 3 Plus Sensor has been APPROVED from 10/09/24 to 11/29/25. Spoke to pharmacy to process.Copay is $26.38.    PA #/Case ID/Reference #: [id number]

## 2024-10-10 NOTE — Assessment & Plan Note (Addendum)
 Initial intertrochanteric fracture s/p ORIF on 10/13 She was discharged to SNF [facility]) and had another unwitnessed fall on 10/28 She returned on 10/30 with hip pain and was found to have a femoral shaft fracture  S/p additional repair on 10/30 Has been stable awaiting SNF placement.   Prior discharge on 11/11 appealed.   SNF placement has now been obtained.  Pt stable to discharge to SNF for rehab today.

## 2024-10-10 NOTE — Plan of Care (Signed)
  Problem: Health Behavior/Discharge Planning: Goal: Ability to manage health-related needs will improve Outcome: Progressing   Problem: Clinical Measurements: Goal: Ability to maintain clinical measurements within normal limits will improve Outcome: Progressing Goal: Will remain free from infection Outcome: Progressing Goal: Diagnostic test results will improve Outcome: Progressing Goal: Respiratory complications will improve Outcome: Progressing Goal: Cardiovascular complication will be avoided Outcome: Progressing   Problem: Nutrition: Goal: Adequate nutrition will be maintained Outcome: Progressing   Problem: Coping: Goal: Level of anxiety will decrease Outcome: Progressing   Problem: Pain Managment: Goal: General experience of comfort will improve and/or be controlled Outcome: Progressing   Problem: Safety: Goal: Ability to remain free from injury will improve Outcome: Progressing   Problem: Skin Integrity: Goal: Risk for impaired skin integrity will decrease Outcome: Progressing

## 2024-10-10 NOTE — Assessment & Plan Note (Signed)
 Continue atorvastatin 

## 2024-10-10 NOTE — Progress Notes (Signed)
 Occupational Therapy Treatment Patient Details Name: NADEGE CARRIGER MRN: [id number] DOB: 09/05/1946 Today's Date: 10/10/2024   History of present illness Pt is 78 yr old female admitted with L femoral shaft fracture on 09/28/24 and S/P ORIF 09/29/24. Pt also recently had another LLE fracture s/p IM rod insertion on 09/08/24. Hx of Alzheimer's disease, DM, COPD, MDD, back surgery, osteoporosis, falls.   OT comments  The pt was seen for functional strengthening, facilitation of ADL participation, and progression of functional activity. She required assist to perform supine to sit, needing moderately increased time with cues, give LLE pain and associated guarding Once seated EOB, she performed face washing with supervision. She subsequently required mod assist to stand using a RW, then to take lateral steps towards the head of the bed using a RW. She was assisted back to bed where she performed simple self-feeding with SBA. Continue OT plan of care. Patient will benefit from continued inpatient follow up therapy, <3 hours/day.       If plan is discharge home, recommend the following:  A lot of help with bathing/dressing/bathroom;Assistance with cooking/housework;Assist for transportation;Help with stairs or ramp for entrance;Direct supervision/assist for medications management;Supervision due to cognitive status;Direct supervision/assist for financial management;A lot of help with walking and/or transfers   Equipment Recommendations  Other (comment) (defer to next setting)    Recommendations for Other Services      Precautions / Restrictions Precautions Precautions: Fall Restrictions Weight Bearing Restrictions Per Provider Order: Yes LLE Weight Bearing Per Provider Order: Weight bearing as tolerated       Mobility Bed Mobility Overal bed mobility: Needs Assistance Bed Mobility: Supine to Sit, Sit to Supine     Supine to sit: HOB elevated, Used rails, Min assist Sit to supine:  Mod assist (required assist for BLE)        Transfers Overall transfer level: Needs assistance Equipment used: Rolling walker (2 wheels) Transfers: Sit to/from Stand Sit to Stand: Mod assist        Balance     Sitting balance-Leahy Scale: Fair       Standing balance-Leahy Scale: Poor            ADL either performed or assessed with clinical judgement   ADL Overall ADL's : Needs assistance/impaired Eating/Feeding: Set up;Bed level Eating/Feeding Details (indicate cue type and reason): She drank from a cup at bed level. Grooming: Set up;Supervision/safety;Sitting Grooming Details (indicate cue type and reason): She performed face washing seated EOB.        Communication Communication Communication: No apparent difficulties   Cognition Arousal: Alert Behavior During Therapy: Anxious Cognition: History of cognitive impairments, Cognition impaired   Orientation impairments: Time, Situation   Memory impairment (select all impairments): Declarative long-term memory, Working memory, Short-term memory Attention impairment (select first level of impairment): Sustained attention, Divided attention Executive functioning impairment (select all impairments): Problem solving, Sequencing, Organization                     Following commands impaired: Follows one step commands with increased time      Cueing   Cueing Techniques: Verbal cues, Gestural cues, Tactile cues             Pertinent Vitals/ Pain       Pain Assessment Pain Assessment: Faces Pain Score: 7  Pain Location: LLE with movement Pain Descriptors / Indicators: Grimacing, Guarding Pain Intervention(s): Limited activity within patient's tolerance, Monitored during session, Repositioned   Frequency  Min 2X/week  Progress Toward Goals  OT Goals(current goals can now be found in the care plan section)     Acute Rehab OT Goals OT Goal Formulation: With patient Time For Goal  Achievement: 10/14/24 Potential to Achieve Goals: Good  Plan         AM-PAC OT 6 Clicks Daily Activity     Outcome Measure   Help from another person eating meals?: A Little Help from another person taking care of personal grooming?: A Little Help from another person toileting, which includes using toliet, bedpan, or urinal?: A Lot Help from another person bathing (including washing, rinsing, drying)?: A Lot Help from another person to put on and taking off regular upper body clothing?: A Little Help from another person to put on and taking off regular lower body clothing?: A Lot 6 Click Score: 15    End of Session Equipment Utilized During Treatment: Gait belt;Rolling walker (2 wheels)  OT Visit Diagnosis: Unsteadiness on feet (R26.81);Other abnormalities of gait and mobility (R26.89);Muscle weakness (generalized) (M62.81);Pain;Other symptoms and signs involving cognitive function;History of falling (Z91.81) Pain - Right/Left: Left Pain - part of body: Hip   Activity Tolerance Patient limited by pain   Patient Left in bed;with call bell/phone within reach;with bed alarm set   Nurse Communication Mobility status        Time: 8375-8360 OT Time Calculation (min): 15 min  Charges: OT General Charges $OT Visit: 1 Visit OT Treatments $Therapeutic Activity: 8-22 mins    Delanna JINNY Lesches, OTR/L 10/10/2024, 4:57 PM

## 2024-10-10 NOTE — Assessment & Plan Note (Addendum)
 No longer taking mirtazepine

## 2024-10-10 NOTE — Plan of Care (Signed)
  Problem: Health Behavior/Discharge Planning: Goal: Ability to identify and utilize available resources and services will improve Outcome: Progressing Goal: Ability to manage health-related needs will improve Outcome: Progressing   Problem: Nutritional: Goal: Maintenance of adequate nutrition will improve Outcome: Progressing Goal: Progress toward achieving an optimal weight will improve Outcome: Completed/Met   Problem: Skin Integrity: Goal: Risk for impaired skin integrity will decrease Outcome: Progressing   Problem: Health Behavior/Discharge Planning: Goal: Ability to manage health-related needs will improve Outcome: Progressing   Problem: Clinical Measurements: Goal: Ability to maintain clinical measurements within normal limits will improve Outcome: Progressing Goal: Will remain free from infection Outcome: Progressing Goal: Diagnostic test results will improve Outcome: Progressing Goal: Respiratory complications will improve Outcome: Progressing Goal: Cardiovascular complication will be avoided Outcome: Progressing   Problem: Nutrition: Goal: Adequate nutrition will be maintained Outcome: Progressing   Problem: Coping: Goal: Level of anxiety will decrease Outcome: Progressing   Problem: Pain Managment: Goal: General experience of comfort will improve and/or be controlled Outcome: Progressing   Problem: Safety: Goal: Ability to remain free from injury will improve Outcome: Progressing   Problem: Skin Integrity: Goal: Risk for impaired skin integrity will decrease Outcome: Progressing

## 2024-10-10 NOTE — TOC Progression Note (Addendum)
 Transition of Care [facility]) - Progression Note    Patient Details  Name: Doris Wyatt MRN: [id number] Date of Birth: Apr 19, 1946  Transition of Care [facility]) CM/SW Contact  Alfonse JONELLE Rex, RN Phone Number: 10/10/2024, 10:33 AM  Clinical Narrative:   Call to patient's daughter, [name], to review bed choice for short term rehab/SNF, no answer, vm full. Text sent requesting contact NCM with facility choice. Call to patient's son, Debby, no answer, vm left with NCM name and phone number requesting a call back. Call to pt's grand daughter, Berwyn, introduced self and reason for call. NCM requested she contact her mother, [name] and have her contact NCM please, voiced understanding,  await call back   -11:37am Call received from Mason, patient's daughter, states of current SNF choices, one is not acceptable and one is too far. NCM explained that recommendation is for short term rehab and these on the only facility choices that we have. [name] asked about her options, NCM explained that patient/family has a right to appeal the discharge. [name] states family will initiate   -3:00pm Met with family at bedside, [name], pt's daughter, restated that of the current SNF bed offers, one facility is not up to family standards and one is too far. Family request referral to [facility]  with request for bedrails for patient safety. [name] confirmed that she initiated appeal of discharge. Referral sent to [facility] health, rep-Starr, for review.   -3:50 HINN 12 issued to patient's daughter, [name] [name] voiced understanding, form left at patient's bedside.    Expected Discharge Plan: Skilled Nursing Facility Barriers to Discharge: Continued Medical Work up               Expected Discharge Plan and Services                                               Social Drivers of Health (SDOH) Interventions SDOH Screenings   Food Insecurity: Patient Unable To Answer (09/28/2024)   Housing: Unknown (09/28/2024)  Transportation Needs: Patient Unable To Answer (09/28/2024)  Utilities: Patient Unable To Answer (09/28/2024)  Depression (PHQ2-9): Low Risk  (11/16/2019)  Social Connections: Unknown (09/28/2024)  Tobacco Use: Medium Risk (09/28/2024)    Readmission Risk Interventions    09/29/2024    1:12 PM 09/09/2024   12:13 PM  Readmission Risk Prevention Plan  Post Dischage Appt  Complete  Medication Screening  Complete  Transportation Screening Complete Complete  PCP or Specialist Appt within 5-7 Days Complete   Home Care Screening Complete   Medication Review (RN CM) Complete

## 2024-10-11 DIAGNOSIS — J449 Chronic obstructive pulmonary disease, unspecified: Secondary | ICD-10-CM

## 2024-10-11 LAB — GLUCOSE, CAPILLARY
Glucose-Capillary: 167 mg/dL — ABNORMAL HIGH (ref 70–99)
Glucose-Capillary: 131 mg/dL — ABNORMAL HIGH (ref 70–99)
Glucose-Capillary: 141 mg/dL — ABNORMAL HIGH (ref 70–99)

## 2024-10-11 LAB — URINALYSIS, ROUTINE W REFLEX MICROSCOPIC
Bilirubin Urine: NEGATIVE
Glucose, UA: NEGATIVE mg/dL
Hgb urine dipstick: NEGATIVE
Ketones, ur: 5 mg/dL — AB
Nitrite: NEGATIVE
Protein, ur: 30 mg/dL — AB
Specific Gravity, Urine: 1.019 (ref 1.005–1.030)
pH: 7 (ref 5.0–8.0)

## 2024-10-11 NOTE — Progress Notes (Signed)
  Progress Note   Patient: Doris Wyatt FMW:[id number] DOB: [birth year] DOA: 09/28/2024     13 DOS: the patient was seen and examined on 10/11/2024   Brief hospital course: 77yo with h/o COPD, anxiety/depression, HTN, HLD, Alzheimer's dementia, and T2DM who presented on 10/30 with a fall.  She had previously had a fall with hip fracture and was hospitalized from 10/9-16 and discharged to SNF s/p L femur intramedullary rod insertion.  This time she had a femoral shaft fracture and underwent ORIF and nail insertion again on 10/30.  She is awaiting STR  in SNF again.  Assessment and Plan: * Left femoral shaft fracture (HCC) Initial intertrochanteric fracture s/p ORIF on 10/13 She was discharged to SNF [facility]) and had another unwitnessed fall on 10/28 She returned on 10/30 with hip pain and was found to have a femoral shaft fracture  S/p additional repair on 10/30 Awaiting placement TOC has been unable to connect with family about their preferred location Will plan to dc today either to facility or to home with home health  Protein-calorie malnutrition, severe Nutrition Problem: Severe Malnutrition Etiology: chronic illness (COPD; dementia) Signs/Symptoms: severe fat depletion, severe muscle depletion Interventions: Refer to RD note for recommendations, Glucerna shake, MVI  Major neurocognitive disorder due to Alzheimer's disease Continue memantine  and divalproex  No longer taking donepezil   Osteoporosis Needs consideration of bisphosphonate therapy with PCP  Generalized anxiety disorder No longer taking mirtazepine  Type 2 diabetes mellitus (HCC) A1c 9, poor control Continue glargine Cover with sensitive-scale SSI Carb modified diet   Hyperlipidemia associated with type 2 diabetes mellitus (HCC) Continue atorvastatin   COPD (chronic obstructive pulmonary disease) Continue albuterol  prn No on home O2 Compensated at this time  Hypertension associated with diabetes  (HCC) Continue valsartan          Subjective: Patient seen awake sitting up in bed, LPN at bedside morning.  LPN reports patient got this yesterday afternoon when he.  She is more calm this morning.  Patient denies any complaints and does not remember that she had fractured her leg  Physical Exam: Vitals:   10/10/24 0539 10/10/24 1319 10/10/24 2230 10/11/24 0528  BP: (!) 144/57 (!) 125/58 (!) 136/55 (!) 152/63  Pulse: 86 (!) 101 82 81  Resp: 16 16 17 16   Temp: 99.7 F (37.6 C) (!) 97.5 F (36.4 C)  97.7 F (36.5 C)  TempSrc: Oral Oral  Oral  SpO2: 96%  95% 94%  Weight:      Height:       General exam: awake, alert, no acute distress, confused HEENT: moist mucus membranes, hearing grossly normal  Respiratory system: CTAB, no wheezes, rales or rhonchi, normal respiratory effort. Cardiovascular system: normal S1/S2, RR, no pedal edema.   Gastrointestinal system: soft, NT, ND Central nervous system: A&O x self. no gross focal neurologic deficits, normal speech Extremities: Left distal lateral femur dressing clean dry intact no edema, normal tone Skin: dry, intact, normal temperature Psychiatry: normal mood, congruent affect, abnormal judgment and insight due to dementia   Data Reviewed:  No new labs today CBGs overall at goal with 2 elevated readings above 200 yesterday  Family Communication: None present  Disposition: Status is: Inpatient Patient was discharged yesterday and family is appealing the discharge   Planned Discharge Destination:  SNF versus home     Time spent: 25 minutes  Author: Burnard DELENA Cunning, DO 10/11/2024 12:00 PM  For on call review [email or web address].

## 2024-10-11 NOTE — TOC Progression Note (Addendum)
 Transition of Care [facility]) - Progression Note    Patient Details  Name: Doris Wyatt MRN: [id number] Date of Birth: [birth year]  Transition of Care [facility]) CM/SW Contact  Alfonse JONELLE Rex, RN Phone Number: 10/11/2024, 3:36 PM  Clinical Narrative:   Call received from  [facility] , Doctor agreed with Attending Physician last covered day 10-11-2024 . NCM notified patient's daughter, [name], through text, [name] accepted bed offer at [facility], [place], [place]. NCM called to [facility], spoke with Olam in admissions, notified of accepted bed offer by patient's family. Lisa to call NCM back with bed availability.   -3:45pm Call received from [facility] with [facility], states no beds available until this Friday. NCM called to [facility], [place], [place]. Call to [facility], admissions coordinator at [phone], confirmed no beds available until next week.   -4:15pm NCM called to [facility], [place]ia, requested review for short term rehab, reports no beds available until this Friday. NCM called to [facility], sw Tammy,  made bed offer for short term rehab. NCM called to patient's daughter [name], discussed no beds available at original two SNF, [facility] made a bed offer, provided jennifer with address of SNF,. [name] accepted bed offer . SNF auth initiated via [facility]. Auth ID [id number], shara pending.     Expected Discharge Plan: Skilled Nursing Facility Barriers to Discharge: Continued Medical Work up               Expected Discharge Plan and Services         Expected Discharge Date: 10/12/24                                     Social Drivers of Health (SDOH) Interventions SDOH Screenings   Food Insecurity: Patient Unable To Answer (09/28/2024)  Housing: Unknown (09/28/2024)  Transportation Needs: Patient Unable To Answer (09/28/2024)  Utilities: Patient Unable To Answer (09/28/2024)   Depression (PHQ2-9): Low Risk  (11/16/2019)  Social Connections: Unknown (09/28/2024)  Tobacco Use: Medium Risk (09/28/2024)    Readmission Risk Interventions    09/29/2024    1:12 PM 09/09/2024   12:13 PM  Readmission Risk Prevention Plan  Post Dischage Appt  Complete  Medication Screening  Complete  Transportation Screening Complete Complete  PCP or Specialist Appt within 5-7 Days Complete   Home Care Screening Complete   Medication Review (RN CM) Complete

## 2024-10-11 NOTE — Progress Notes (Signed)
 Physical Therapy Treatment Patient Details Name: Doris Wyatt MRN: [id number] DOB: [birth year] Today's Date: 10/11/2024   History of Present Illness Pt is 78 yo female admitted with L femoral shaft fracture on 09/28/24 and S/P ORIF 10/31. Pt just recently had another L hip fx with IM rod insertion on 09/08/24. Hx of Alz, DM, COPD, MDD, back surgery, osteoporosis, falls.    PT Comments  Pt making good progress with mobility.  She does need max encouragment and cues but only needing min-mod physical assistance.  Had +2 for safety and chair follow.  Pt ambulated 30' today and good participation with exercises. Cont POC.  Patient will benefit from continued inpatient follow up therapy, <3 hours/day at d/c.     If plan is discharge home, recommend the following: A lot of help with walking and/or transfers;A lot of help with bathing/dressing/bathroom;Assistance with cooking/housework;Help with stairs or ramp for entrance   Can travel by private vehicle     Yes  Equipment Recommendations  Wheelchair cushion (measurements PT);Wheelchair (measurements PT);Rolling walker (2 wheels)    Recommendations for Other Services       Precautions / Restrictions Precautions Precautions: Fall Restrictions LLE Weight Bearing Per Provider Order: Weight bearing as tolerated     Mobility  Bed Mobility Overal bed mobility: Needs Assistance Bed Mobility: Supine to Sit     Supine to sit: Mod assist, HOB elevated, Used rails     General bed mobility comments: Good effort from pt in using bil L to scoot hips to EOB, reaching to pull up on therapist and then scooting forward with cues.  Did need some  A to lift trunk and for L LE    Transfers Overall transfer level: Needs assistance Equipment used: Rolling walker (2 wheels) Transfers: Sit to/from Stand Sit to Stand: Min assist           General transfer comment: max cues but only min A to stand    Ambulation/Gait Ambulation/Gait  assistance: Min assist, +2 safety/equipment Gait Distance (Feet): 30 Feet Assistive device: Rolling walker (2 wheels) Gait Pattern/deviations: Step-to pattern, Decreased stride length, Decreased weight shift to left Gait velocity: decreased     General Gait Details: Requiring max encouragment and min A for advancing and turning RW and occasionally for balance.   Stairs             Wheelchair Mobility     Tilt Bed    Modified Rankin (Stroke Patients Only)       Balance Overall balance assessment: Needs assistance Sitting-balance support: No upper extremity supported, Feet supported Sitting balance-Leahy Scale: Fair     Standing balance support: Bilateral upper extremity supported, During functional activity, Reliant on assistive device for balance Standing balance-Leahy Scale: Poor Standing balance comment: RW and min A                            Communication    Cognition Arousal: Alert Behavior During Therapy: Anxious   PT - Cognitive impairments: History of cognitive impairments                       PT - Cognition Comments: AxO x 1 ; fearful and anxious , did follow commands but required increased time and max encouragment for participation.  Pt would state I don't want to do this, let me stay in bed.  Educated and encouraged on importance of mobility.  Had to continually provide  encouragement throughout.        Cueing    Exercises General Exercises - Lower Extremity Ankle Circles/Pumps: AROM, Both, 10 reps, Supine Ankle Circles/Pumps Limitations: tactile cues Quad Sets: AROM, Both, 10 reps, Supine, Limitations Quad Sets Limitations: tactile cues Long Arc Quad: AROM, Both, 10 reps, Supine Heel Slides: 10 reps, Supine, AAROM, Left    General Comments General comments (skin integrity, edema, etc.): VSS wtih transfers      Pertinent Vitals/Pain Pain Assessment Pain Assessment: Faces Faces Pain Scale: Hurts little more Pain  Location: LLE with movement Pain Descriptors / Indicators: Grimacing, Guarding Pain Intervention(s): Limited activity within patient's tolerance, Monitored during session, Repositioned (noted trying to avoid meds as possible due to dementia)    Home Living                          Prior Function            PT Goals (current goals can now be found in the care plan section) Progress towards PT goals: Progressing toward goals    Frequency    Min 2X/week      PT Plan      Co-evaluation              AM-PAC PT 6 Clicks Mobility   Outcome Measure  Help needed turning from your back to your side while in a flat bed without using bedrails?: A Lot Help needed moving from lying on your back to sitting on the side of a flat bed without using bedrails?: A Lot Help needed moving to and from a bed to a chair (including a wheelchair)?: A Lot Help needed standing up from a chair using your arms (e.g., wheelchair or bedside chair)?: A Lot Help needed to walk in hospital room?: A Lot Help needed climbing 3-5 steps with a railing? : Total 6 Click Score: 11    End of Session Equipment Utilized During Treatment: Gait belt Activity Tolerance: Patient tolerated treatment well Patient left: with call bell/phone within reach;with chair alarm set;in chair (door open, tray over pt) Nurse Communication: Mobility status PT Visit Diagnosis: Difficulty in walking, not elsewhere classified (R26.2);Muscle weakness (generalized) (M62.81);History of falling (Z91.81);Pain Pain - Right/Left: Left Pain - part of body: Hip;Leg     Time: 1640-1705 PT Time Calculation (min) (ACUTE ONLY): 25 min  Charges:    $Gait Training: 8-22 mins $Therapeutic Exercise: 8-22 mins PT General Charges $$ ACUTE PT VISIT: 1 Visit                     Benjiman, PT Acute Rehab [facility] [phone]    Benjiman VEAR Mulberry 10/11/2024, 5:43 PM

## 2024-10-11 NOTE — Progress Notes (Signed)
 Dime sized bump noted on patient's labia while doing peri care. MD notified, no orders at this time. Will continue to monitor.

## 2024-10-12 DIAGNOSIS — E43 Unspecified severe protein-calorie malnutrition: Secondary | ICD-10-CM

## 2024-10-12 LAB — GLUCOSE, CAPILLARY
Glucose-Capillary: 262 mg/dL — ABNORMAL HIGH (ref 70–99)
Glucose-Capillary: 205 mg/dL — ABNORMAL HIGH (ref 70–99)
Glucose-Capillary: 234 mg/dL — ABNORMAL HIGH (ref 70–99)
Glucose-Capillary: 145 mg/dL — ABNORMAL HIGH (ref 70–99)
Glucose-Capillary: 152 mg/dL — ABNORMAL HIGH (ref 70–99)

## 2024-10-12 MED ORDER — POLYETHYLENE GLYCOL 3350 17 G PO PACK: 17.0000 g | PACK | Freq: Every day | ORAL | Status: AC | PRN

## 2024-10-12 MED ORDER — ADULT MULTIVITAMIN W/MINERALS CH: 1.0000 | ORAL_TABLET | Freq: Every day | ORAL | Status: AC

## 2024-10-12 MED ORDER — AMLODIPINE BESYLATE 5 MG PO TABS
2.5000 mg | ORAL_TABLET | Freq: Every day | ORAL | Status: DC
  Administered 2024-10-12 – 2024-10-13 (×2): 2.5 mg via ORAL
  Filled 2024-10-12 (×2): qty 1

## 2024-10-12 MED ORDER — INSULIN GLARGINE-YFGN 100 UNIT/ML ~~LOC~~ SOLN
16.0000 [IU] | Freq: Every day | SUBCUTANEOUS | Status: DC
  Administered 2024-10-13: 16 [IU] via SUBCUTANEOUS
  Filled 2024-10-12: qty 0.16

## 2024-10-12 MED ORDER — DOCUSATE SODIUM 100 MG PO CAPS: 100.0000 mg | ORAL_CAPSULE | Freq: Two times a day (BID) | ORAL | Status: AC | PRN

## 2024-10-12 MED ORDER — INSULIN ASPART 100 UNIT/ML IJ SOLN: 0.0000 [IU] | Freq: Three times a day (TID) | INTRAMUSCULAR | Status: AC

## 2024-10-12 MED ORDER — PANTOPRAZOLE SODIUM 40 MG PO TBEC: 40.0000 mg | DELAYED_RELEASE_TABLET | Freq: Every day | ORAL | Status: AC

## 2024-10-12 MED ORDER — AMLODIPINE BESYLATE 2.5 MG PO TABS: 2.5000 mg | ORAL_TABLET | Freq: Every day | ORAL | Status: AC

## 2024-10-12 MED ORDER — LANTUS SOLOSTAR 100 UNIT/ML ~~LOC~~ SOPN: 16.0000 [IU] | PEN_INJECTOR | Freq: Every morning | SUBCUTANEOUS | Status: AC

## 2024-10-12 NOTE — Care Management Important Message (Signed)
 Important Message  Patient Details IM Letter given. Name: Doris Wyatt MRN: [id number] Date of Birth: 1946-11-16   Important Message Given:  Yes - Medicare IM     Melba Ates 10/12/2024, 10:15 AM

## 2024-10-12 NOTE — Progress Notes (Signed)
 Nutrition Follow-up  DOCUMENTATION CODES:   Severe malnutrition in context of chronic illness  INTERVENTION:  - Regular diet.              - Continue assistance with meals to support increasing intake.              - Continue automatic trays to ensure patient receives 3 meals a day given dementia. -Glucerna Shake po TID, each supplement provides 220 kcal and 10 grams of protein  - Encourage intake at all meals and of supplements.   -Multivitamin with minerals daily - Monitor weight trends.  - No new weight since initial admission weight. Ordered new weight.    NUTRITION DIAGNOSIS:   Severe Malnutrition related to chronic illness (COPD; dementia) as evidenced by severe fat depletion, severe muscle depletion. *ongoing  GOAL:   Patient will meet greater than or equal to 90% of their needs *progressing  MONITOR:   PO intake, Supplement acceptance, Weight trends  REASON FOR ASSESSMENT:   Consult Hip fracture protocol  ASSESSMENT:   78 y.o. female with medical history significant of external pain,*Tritus, COPD, diverticulosis,/diverticulitis with perforation, nephrolithiasis, anxiety, depression, hyperlipidemia, hypertension, Alzheimer's disease, osteoporosis, history of pneumonia, type 2 diabetes, ureteral stone with hydronephrosis who underwent a left femur intramedullary nail insertion 3 weeks ago, discharged to SNF, but sent to the emergency department after having an unwitnessed fall 2 days ago and complaining of pain in the left hip area.  Left femur x-ray showing mildly displaced spiral fracture of the mid femoral shaft.  Patient sleeping at time of visit, did not awake to sound of voice. No family or visitors at bedside.   Per chart review, patient has been eating 0-80% of meals. She continues to receive automatic trays due to dementia and assistance with all meals ordered and in place.  Patient has been accepting Glucerna 0-3x/day, thankfully she is mostly accepting 2-3x  daily.  Will continue oral supplements to support intake. Insurance auth pending for SNF.   Admit weight: 104# Current weight: 104# (no new wt taken since admit, ordered new wt)   Medications reviewed and include: Dulcolax, Colace, Miralax , MVI   Labs reviewed:  No BMP since 11/9 HA1C 9.0 (as of 10/12) Blood Glucose 141-262 x24 hours  Diet Order:   Diet Order             Diet general           Diet regular Room service appropriate? Yes with Assist; Fluid consistency: Thin  Diet effective now                   EDUCATION NEEDS:  Not appropriate for education at this time  Skin:  Skin Assessment: Skin Integrity Issues: Skin Integrity Issues:: Incisions Incisions: Left Leg  Last BM:  11/11  Height:  Ht Readings from Last 1 Encounters:  09/29/24 5' 5 (1.651 m)   Weight:  Wt Readings from Last 1 Encounters:  09/29/24 47.2 kg    BMI:  Body mass index is 17.32 kg/m.  Estimated Nutritional Needs:  Kcal:  1350-1550 Protein:  65-75g Fluid:  1.6L/day    Trude Ned RD, LDN Contact via Secure Chat.

## 2024-10-12 NOTE — NC FL2 (Signed)
 [place]  MEDICAID FL2 LEVEL OF CARE FORM     IDENTIFICATION  Patient Name: Doris Wyatt Birthdate: 09/02/1946 Sex: female Admission Date (Current Location): 09/28/2024  [facility] and [place] Number:  Producer, Television/film/video and Address:  [facility],  [address], [place] [address]      Provider Number: [id number]  Attending Physician Name and Address:  Fausto Burnard LABOR, DO  Relative Name and Phone Number:  Carrigan, Delafuente (Son)  [phone] [facility])    Current Level of Care: Hospital Recommended Level of Care: Skilled Nursing Facility Prior Approval Number:    Date Approved/Denied:   PASRR Number: pending  Discharge Plan: SNF    Current Diagnoses: Patient Active Problem List   Diagnosis Date Noted   Left femoral shaft fracture (HCC) 09/28/2024   Protein-calorie malnutrition, severe 09/08/2024   Closed comminuted intertrochanteric fracture of proximal femur, left, initial encounter (HCC) 09/07/2024   Generalized anxiety disorder 07/01/2021   Osteoporosis 07/01/2021   Major neurocognitive disorder due to Alzheimer's disease 07/01/2021   Diverticulitis of large intestine with perforation without abscess or bleeding    Ureteral stone with hydronephrosis 10/31/2017   Hypertension associated with diabetes (HCC)    COPD (chronic obstructive pulmonary disease)    Hyperlipidemia associated with type 2 diabetes mellitus (HCC)    Type 2 diabetes mellitus (HCC)     Orientation RESPIRATION BLADDER Height & Weight     Self  Normal Incontinent, External catheter Weight: 47.2 kg Height:  5' 5 (165.1 cm)  BEHAVIORAL SYMPTOMS/MOOD NEUROLOGICAL BOWEL NUTRITION STATUS      Incontinent Diet (regular)  AMBULATORY STATUS COMMUNICATION OF NEEDS Skin   Extensive Assist Verbally Surgical wounds (Lef Hip IM Nail)                       Personal Care Assistance Level of Assistance  Bathing, Feeding, Dressing Bathing Assistance: Limited assistance Feeding  assistance: Limited assistance Dressing Assistance: Limited assistance     Functional Limitations Info  Sight, Hearing, Speech Sight Info: Adequate Hearing Info: Impaired (hard of hearing) Speech Info: Adequate    SPECIAL CARE FACTORS FREQUENCY  PT (By licensed PT), OT (By licensed OT)     PT Frequency: 5x/wk OT Frequency: 5x/wk            Contractures Contractures Info: Not present    Additional Factors Info  Code Status, Allergies, Psychotropic Code Status Info: Full code Allergies Info: NKA Psychotropic Info: see MAR         Current Medications (10/12/2024):  This is the current hospital active medication list Current Facility-Administered Medications  Medication Dose Route Frequency Provider Last Rate Last Admin   acetaminophen  (TYLENOL ) tablet 650 mg  650 mg Oral Q6H PRN Celinda Alm Lot, MD   650 mg at 10/11/24 2227   Or   acetaminophen  (TYLENOL ) suppository 650 mg  650 mg Rectal Q6H PRN Celinda Alm Lot, MD       amLODipine [facility]) tablet 2.5 mg  2.5 mg Oral Daily Fausto Burnard A, DO   2.5 mg at 10/12/24 1032   aspirin EC tablet 81 mg  81 mg Oral QPM Celinda Alm Lot, MD   81 mg at 10/11/24 1723   atorvastatin  (LIPITOR) tablet 40 mg  40 mg Oral Daily Celinda Alm Lot, MD   40 mg at 10/12/24 9178   bisacodyl (DULCOLAX) EC tablet 5 mg  5 mg Oral Daily Celinda Alm Lot, MD   5 mg at 10/12/24 [phone]  divalproex  (DEPAKOTE ) DR tablet 125 mg  125 mg Oral BID Celinda Alm Lot, MD   125 mg at 10/12/24 0820   docusate sodium  (COLACE) capsule 100 mg  100 mg Oral BID Reyne Cordella SQUIBB, MD   100 mg at 10/12/24 0821   enoxaparin (LOVENOX) injection 40 mg  40 mg Subcutaneous Q24H Reyne Cordella SQUIBB, MD   40 mg at 10/12/24 9180   feeding supplement (GLUCERNA SHAKE) (GLUCERNA SHAKE) liquid 237 mL  237 mL Oral TID BM Pokhrel, Laxman, MD   237 mL at 10/12/24 0819   HYDROcodone -acetaminophen  (NORCO/VICODIN) 5-325 MG per tablet 1 tablet  1 tablet Oral Q6H PRN  Celinda Alm Lot, MD   1 tablet at 10/11/24 1748   insulin  aspart (novoLOG ) injection 0-9 Units  0-9 Units Subcutaneous TID WC Celinda Alm Lot, MD   3 Units at 10/12/24 1237   insulin  glargine-yfgn [facility]) injection 13 Units  13 Units Subcutaneous Daily Rai, Ripudeep K, MD   13 Units at 10/12/24 9178   irbesartan  (AVAPRO ) tablet 75 mg  75 mg Oral Daily Celinda Alm Lot, MD   75 mg at 10/12/24 9178   lidocaine  (LIDODERM ) 5 % 1 patch  1 patch Transdermal Q24H Celinda Alm Lot, MD   1 patch at 10/12/24 9180   memantine  (NAMENDA ) tablet 10 mg  10 mg Oral BID Celinda Alm Lot, MD   10 mg at 10/12/24 0820   multivitamin with minerals tablet 1 tablet  1 tablet Oral Daily Pokhrel, Laxman, MD   1 tablet at 10/12/24 0820   ondansetron  (ZOFRAN ) tablet 4 mg  4 mg Oral Q6H PRN Gebauer, Gregory P, MD   4 mg at 09/30/24 2051   Or   ondansetron  (ZOFRAN ) injection 4 mg  4 mg Intravenous Q6H PRN Reyne Cordella SQUIBB, MD       pantoprazole (PROTONIX) EC tablet 40 mg  40 mg Oral Daily Pokhrel, Laxman, MD   40 mg at 10/12/24 9178   polyethylene glycol (MIRALAX  / GLYCOLAX ) packet 17 g  17 g Oral Daily Pokhrel, Laxman, MD   17 g at 10/12/24 0820     Discharge Medications: Please see discharge summary for a list of discharge medications.  Relevant Imaging Results:  Relevant Lab Results:   Additional Information SSN: [id number]  Alfonse JONELLE Rex, RN

## 2024-10-12 NOTE — TOC Progression Note (Signed)
 Transition of Care [facility]) - Progression Note    Patient Details  Name: Doris Wyatt MRN: [id number] Date of Birth: 1946-07-25  Transition of Care [facility]) CM/SW Contact  Heather DELENA Saltness, LCSW Phone Number: 10/12/2024, 5:11 PM  Clinical Narrative:    CSW checked  MUST website to obtain status of Level II PASRR. Screen still running. Pt to discharge to [facility] once PASRR number is received, hopefully tomorrow 11/14. RN made aware.   Expected Discharge Plan: Skilled Nursing Facility Barriers to Discharge: Continued Medical Work up   Expected Discharge Plan and Services  [facility]       Expected Discharge Date: 10/12/24                   Social Drivers of Health (SDOH) Interventions SDOH Screenings   Food Insecurity: Patient Unable To Answer (09/28/2024)  Housing: Unknown (09/28/2024)  Transportation Needs: Patient Unable To Answer (09/28/2024)  Utilities: Patient Unable To Answer (09/28/2024)  Depression (PHQ2-9): Low Risk  (11/16/2019)  Social Connections: Unknown (09/28/2024)  Tobacco Use: Medium Risk (09/28/2024)    Readmission Risk Interventions    09/29/2024    1:12 PM 09/09/2024   12:13 PM  Readmission Risk Prevention Plan  Post Dischage Appt  Complete  Medication Screening  Complete  Transportation Screening Complete Complete  PCP or Specialist Appt within 5-7 Days Complete   Home Care Screening Complete   Medication Review (RN CM) Complete     Signed: Heather Saltness, MSW, LCSW Clinical Social Worker Inpatient Care Management 10/12/2024 5:13 PM

## 2024-10-12 NOTE — Progress Notes (Signed)
 Occupational Therapy Treatment Patient Details Name: ETHER GOEBEL MRN: [id number] DOB: [birth year] Today's Date: 10/12/2024   History of present illness Pt is 78 yr old female admitted with L femoral shaft fracture on 09/28/24, S/P ORIF 09/29/24. Pt also recently had sustained a LLE fracture s/p IM rod insertion on 09/08/24. Hx of Alzheimer's disease, DM, COPD, MDD, back surgery, osteoporosis, falls.   OT comments  The patient is demonstrating gradual functional progress. She was assisted into sitting edge of bed, then into standing using a RW. Once in standing, she required significant assist for peri-hygiene. She was further instructed on lateral stepping towards the head of the bed using a RW, requiring assistance for balance and advancing the RW. Her overall activity tolerance appeared improved today. Continue OT plan of care. Patient will benefit from continued inpatient follow up therapy, <3 hours/day.       If plan is discharge home, recommend the following:  A lot of help with bathing/dressing/bathroom;Assistance with cooking/housework;Assist for transportation;Help with stairs or ramp for entrance;Direct supervision/assist for medications management;Supervision due to cognitive status;Direct supervision/assist for financial management;A lot of help with walking and/or transfers   Equipment Recommendations  Other (comment) (to be determined pending progress at next setting)    Recommendations for Other Services      Precautions / Restrictions Precautions Precautions: Fall Restrictions Weight Bearing Restrictions Per Provider Order: Yes LLE Weight Bearing Per Provider Order: Weight bearing as tolerated       Mobility Bed Mobility Overal bed mobility: Needs Assistance Bed Mobility: Supine to Sit     Supine to sit: Mod assist, HOB elevated, Used rails          Transfers Overall transfer level: Needs assistance Equipment used: Rolling walker (2  wheels) Transfers: Sit to/from Stand Sit to Stand: Min assist           General transfer comment: Once in standing, she was instructed on lateral stepping towards the head of the bed using a RW for support. She required intermittent assist and cues for walker advancement, as well as constant assist for balance.     Balance     Sitting balance-Leahy Scale: Fair       Standing balance-Leahy Scale: Poor           ADL either performed or assessed with clinical judgement   ADL Overall ADL's : Needs assistance/impaired     Grooming: Set up;Bed level Grooming Details (indicate cue type and reason): The patient drank from a cup at bed level.             Lower Body Dressing: Total assistance;Sitting/lateral leans Lower Body Dressing Details (indicate cue type and reason): to donn socks seated EOB     Toileting- Clothing Manipulation and Hygiene: Maximal assistance;Sit to/from stand Toileting - Clothing Manipulation Details (indicate cue type and reason): Pt required assist to stand from the EOB using a RW for support. She was noted to be soiled, requiring significant assistance to perform peri-hygiene in standing. She also required assist for balance and clothing management. She was unable to safely release the RW to perform hygiene management independently.             Communication Communication Communication: No apparent difficulties   Cognition Arousal: Alert Behavior During Therapy: Anxious Cognition: History of cognitive impairments, Cognition impaired   Orientation impairments: Time, Situation Awareness: Online awareness impaired Memory impairment (select all impairments): Declarative long-term memory, Working memory, Short-term memory Attention impairment (select first level of impairment): Sustained  attention, Divided attention Executive functioning impairment (select all impairments): Problem solving, Sequencing, Organization          Following  commands: Impaired Following commands impaired: Follows one step commands with increased time      Cueing   Cueing Techniques: Verbal cues, Gestural cues, Tactile cues             Pertinent Vitals/ Pain       Pain Assessment Pain Assessment: Faces Pain Score: 5  Pain Location: LLE with movement Pain Descriptors / Indicators: Grimacing, Guarding, Operative site guarding Pain Intervention(s): Limited activity within patient's tolerance, Monitored during session, Repositioned   Frequency  Min 2X/week        Progress Toward Goals  OT Goals(current goals can now be found in the care plan section)  Progress towards OT goals: Progressing toward goals  Acute Rehab OT Goals Patient Stated Goal: decreased pain OT Goal Formulation: With patient Time For Goal Achievement: 10/26/24 Potential to Achieve Goals: Good  Plan         AM-PAC OT 6 Clicks Daily Activity     Outcome Measure   Help from another person eating meals?: A Little Help from another person taking care of personal grooming?: A Little Help from another person toileting, which includes using toliet, bedpan, or urinal?: A Lot Help from another person bathing (including washing, rinsing, drying)?: A Lot Help from another person to put on and taking off regular upper body clothing?: A Little Help from another person to put on and taking off regular lower body clothing?: A Lot 6 Click Score: 15    End of Session Equipment Utilized During Treatment: Rolling walker (2 wheels);Gait belt  OT Visit Diagnosis: Unsteadiness on feet (R26.81);Other abnormalities of gait and mobility (R26.89);Muscle weakness (generalized) (M62.81);Pain;Other symptoms and signs involving cognitive function;History of falling (Z91.81) Pain - Right/Left: Left Pain - part of body: Hip   Activity Tolerance Patient limited by pain   Patient Left in bed;with call bell/phone within reach;with bed alarm set   Nurse Communication Mobility  status        Time: 8965-8945 OT Time Calculation (min): 20 min  Charges: OT General Charges $OT Visit: 1 Visit OT Treatments $Therapeutic Activity: 8-22 mins     Delanna JINNY Lesches, OTR/L 10/12/2024, 4:17 PM

## 2024-10-12 NOTE — Plan of Care (Signed)
  Problem: Health Behavior/Discharge Planning: Goal: Ability to identify and utilize available resources and services will improve Outcome: Progressing Goal: Ability to manage health-related needs will improve Outcome: Progressing

## 2024-10-12 NOTE — Progress Notes (Addendum)
 Progress Note   Patient: Doris Wyatt FMW:[id number] DOB: February 21, 1946 DOA: 09/28/2024     14 DOS: the patient was seen and examined on 10/12/2024   Brief hospital course: 78yo with h/o COPD, anxiety/depression, HTN, HLD, Alzheimer's dementia, and T2DM who presented on 10/30 with a fall.  She had previously had a fall with hip fracture and was hospitalized from 10/9-16 and discharged to SNF s/p L femur intramedullary rod insertion.  This time she had a femoral shaft fracture and underwent ORIF and nail insertion again on 10/30.  She is awaiting STR  in SNF again.  Assessment and Plan: * Left femoral shaft fracture (HCC) Initial intertrochanteric fracture s/p ORIF on 10/13 She was discharged to SNF [facility]) and had another unwitnessed fall on 10/28 She returned on 10/30 with hip pain and was found to have a femoral shaft fracture  S/p additional repair on 10/30 Awaiting placement TOC has been unable to connect with family about their preferred location Will plan to dc today either to facility or to home with home health  Protein-calorie malnutrition, severe Nutrition Problem: Severe Malnutrition Etiology: chronic illness (COPD; dementia) Signs/Symptoms: severe fat depletion, severe muscle depletion Interventions: Refer to RD note for recommendations, Glucerna shake, MVI  Major neurocognitive disorder due to Alzheimer's disease Continue memantine  and divalproex  No longer taking donepezil   Osteoporosis Needs consideration of bisphosphonate therapy with PCP  Generalized anxiety disorder No longer taking mirtazepine  Type 2 diabetes mellitus (HCC) A1c 9, poor control 11/13 -multiple CBGs in the 200s, above will Continue glargine -increase to 16 units daily Sensitive sliding scale NovoLog  Carb modified diet  Titrate regimen for inpatient goal 140-180  Hyperlipidemia associated with type 2 diabetes mellitus (HCC) Continue atorvastatin   COPD (chronic obstructive  pulmonary disease) Continue albuterol  prn No on home O2 Compensated at this time  Hypertension associated with diabetes (HCC) Continue valsartan          Subjective: Patient awake sitting up in bed when seen on rounds this morning.  She denies any complaints.  Breakfast tray had just been dropped off and I asked if she needed assistance sitting up to eat breakfast.  She stated no that she was not hungry.  No family present at time of my visit.  Physical Exam: Vitals:   10/11/24 1320 10/11/24 2228 10/12/24 0606 10/12/24 1032  BP: (!) 151/72 (!) 181/77 (!) 156/55 (!) 156/55  Pulse: 93 89 68   Resp: 15 16 15    Temp: 98.8 F (37.1 C) 98 F (36.7 C) 98.9 F (37.2 C)   TempSrc: Oral  Oral   SpO2: 96% 97% 96%   Weight:      Height:       General exam: awake, alert, no acute distress, confused HEENT: moist mucus membranes, hearing grossly normal  Respiratory system: Lungs clear without wheezes or rhonchi, on room air, normal respiratory effort. Cardiovascular system: Regular rate and rhythm, no peripheral edema Gastrointestinal system: soft, NT, ND Central nervous system: A&O x self. no gross focal neurologic deficits, normal speech Extremities: Left distal lateral femur dressing clean dry intact Skin: dry, intact, normal temperature Psychiatry: normal mood, congruent affect, abnormal judgment and insight due to dementia   Data Reviewed:  No new labs today  UA yesterday cloudy with moderate leukocytes.  Urine culture added  CBGs ranging 131-262, above goal  Family Communication: None present  Disposition: Status is: Inpatient Patient was discharged on 11/11 and family is appealing the discharge   Planned Discharge Destination:  SNF  versus home     Time spent: 35 minutes  Author: Burnard DELENA Cunning, DO 10/12/2024 1:14 PM  For on call review [email or web address].

## 2024-10-12 NOTE — Assessment & Plan Note (Signed)
 A1c 9, poor control 11/13 -multiple CBGs in the 200s, above will Continue glargine -increase to 16 units daily Sensitive sliding scale NovoLog  Carb modified diet  Titrate regimen for inpatient goal 140-180

## 2024-10-12 NOTE — TOC PASRR Note (Signed)
 30 Day PASRR Note   Patient Details  Name: Doris Wyatt Date of Birth: Sep 16, 1946   Transition of Care [facility]) CM/SW Contact:    Alfonse JONELLE Rex, RN Phone Number: 10/12/2024, 12:44 PM  To Whom It May Concern:  Please be advised that this patient will require a short-term nursing home stay - anticipated 30 days or less for rehabilitation and strengthening.   The plan is for return home.

## 2024-10-13 DIAGNOSIS — M19032 Primary osteoarthritis, left wrist: Secondary | ICD-10-CM | POA: Diagnosis not present

## 2024-10-13 DIAGNOSIS — M1612 Unilateral primary osteoarthritis, left hip: Secondary | ICD-10-CM | POA: Diagnosis not present

## 2024-10-13 DIAGNOSIS — M47816 Spondylosis without myelopathy or radiculopathy, lumbar region: Secondary | ICD-10-CM | POA: Diagnosis not present

## 2024-10-13 DIAGNOSIS — Z741 Need for assistance with personal care: Secondary | ICD-10-CM | POA: Diagnosis not present

## 2024-10-13 DIAGNOSIS — M25532 Pain in left wrist: Secondary | ICD-10-CM | POA: Diagnosis not present

## 2024-10-13 DIAGNOSIS — Z043 Encounter for examination and observation following other accident: Secondary | ICD-10-CM | POA: Diagnosis not present

## 2024-10-13 DIAGNOSIS — M549 Dorsalgia, unspecified: Secondary | ICD-10-CM | POA: Diagnosis not present

## 2024-10-13 DIAGNOSIS — Z79899 Other long term (current) drug therapy: Secondary | ICD-10-CM | POA: Diagnosis not present

## 2024-10-13 DIAGNOSIS — M47812 Spondylosis without myelopathy or radiculopathy, cervical region: Secondary | ICD-10-CM | POA: Diagnosis not present

## 2024-10-13 DIAGNOSIS — J449 Chronic obstructive pulmonary disease, unspecified: Secondary | ICD-10-CM | POA: Diagnosis not present

## 2024-10-13 DIAGNOSIS — R8290 Unspecified abnormal findings in urine: Secondary | ICD-10-CM | POA: Clinically undetermined

## 2024-10-13 DIAGNOSIS — I700 Atherosclerosis of aorta: Secondary | ICD-10-CM | POA: Diagnosis not present

## 2024-10-13 DIAGNOSIS — M25531 Pain in right wrist: Secondary | ICD-10-CM | POA: Diagnosis present

## 2024-10-13 DIAGNOSIS — R58 Hemorrhage, not elsewhere classified: Secondary | ICD-10-CM | POA: Diagnosis not present

## 2024-10-13 DIAGNOSIS — M25539 Pain in unspecified wrist: Secondary | ICD-10-CM | POA: Diagnosis not present

## 2024-10-13 DIAGNOSIS — M542 Cervicalgia: Secondary | ICD-10-CM | POA: Diagnosis not present

## 2024-10-13 DIAGNOSIS — Z7982 Long term (current) use of aspirin: Secondary | ICD-10-CM | POA: Diagnosis not present

## 2024-10-13 DIAGNOSIS — I6782 Cerebral ischemia: Secondary | ICD-10-CM | POA: Diagnosis not present

## 2024-10-13 DIAGNOSIS — W19XXXA Unspecified fall, initial encounter: Secondary | ICD-10-CM | POA: Diagnosis not present

## 2024-10-13 DIAGNOSIS — M545 Low back pain, unspecified: Secondary | ICD-10-CM | POA: Diagnosis not present

## 2024-10-13 DIAGNOSIS — E1159 Type 2 diabetes mellitus with other circulatory complications: Secondary | ICD-10-CM | POA: Diagnosis not present

## 2024-10-13 DIAGNOSIS — S299XXA Unspecified injury of thorax, initial encounter: Secondary | ICD-10-CM | POA: Diagnosis not present

## 2024-10-13 DIAGNOSIS — E119 Type 2 diabetes mellitus without complications: Secondary | ICD-10-CM | POA: Diagnosis not present

## 2024-10-13 DIAGNOSIS — M546 Pain in thoracic spine: Secondary | ICD-10-CM | POA: Diagnosis not present

## 2024-10-13 DIAGNOSIS — M6281 Muscle weakness (generalized): Secondary | ICD-10-CM | POA: Diagnosis not present

## 2024-10-13 DIAGNOSIS — S72302D Unspecified fracture of shaft of left femur, subsequent encounter for closed fracture with routine healing: Secondary | ICD-10-CM | POA: Diagnosis not present

## 2024-10-13 DIAGNOSIS — Z794 Long term (current) use of insulin: Secondary | ICD-10-CM | POA: Diagnosis not present

## 2024-10-13 DIAGNOSIS — N390 Urinary tract infection, site not specified: Secondary | ICD-10-CM | POA: Diagnosis not present

## 2024-10-13 DIAGNOSIS — Z7401 Bed confinement status: Secondary | ICD-10-CM | POA: Diagnosis not present

## 2024-10-13 DIAGNOSIS — M810 Age-related osteoporosis without current pathological fracture: Secondary | ICD-10-CM | POA: Diagnosis not present

## 2024-10-13 DIAGNOSIS — R531 Weakness: Secondary | ICD-10-CM | POA: Diagnosis not present

## 2024-10-13 DIAGNOSIS — S0990XA Unspecified injury of head, initial encounter: Secondary | ICD-10-CM | POA: Diagnosis not present

## 2024-10-13 DIAGNOSIS — E43 Unspecified severe protein-calorie malnutrition: Secondary | ICD-10-CM | POA: Diagnosis not present

## 2024-10-13 DIAGNOSIS — I152 Hypertension secondary to endocrine disorders: Secondary | ICD-10-CM | POA: Diagnosis not present

## 2024-10-13 DIAGNOSIS — S3992XA Unspecified injury of lower back, initial encounter: Secondary | ICD-10-CM | POA: Diagnosis not present

## 2024-10-13 DIAGNOSIS — R2689 Other abnormalities of gait and mobility: Secondary | ICD-10-CM | POA: Diagnosis not present

## 2024-10-13 DIAGNOSIS — I10 Essential (primary) hypertension: Secondary | ICD-10-CM | POA: Diagnosis not present

## 2024-10-13 DIAGNOSIS — G309 Alzheimer's disease, unspecified: Secondary | ICD-10-CM | POA: Diagnosis not present

## 2024-10-13 DIAGNOSIS — F0280 Dementia in other diseases classified elsewhere without behavioral disturbance: Secondary | ICD-10-CM | POA: Diagnosis not present

## 2024-10-13 DIAGNOSIS — S6992XA Unspecified injury of left wrist, hand and finger(s), initial encounter: Secondary | ICD-10-CM | POA: Diagnosis not present

## 2024-10-13 LAB — GLUCOSE, CAPILLARY: Glucose-Capillary: 136 mg/dL — ABNORMAL HIGH (ref 70–99)

## 2024-10-13 MED ORDER — NITROFURANTOIN MONOHYD MACRO 100 MG PO CAPS: 100.0000 mg | ORAL_CAPSULE | Freq: Two times a day (BID) | ORAL | Status: AC

## 2024-10-13 MED ORDER — NITROFURANTOIN MONOHYD MACRO 100 MG PO CAPS
100.0000 mg | ORAL_CAPSULE | Freq: Two times a day (BID) | ORAL | Status: DC
  Administered 2024-10-13: 100 mg via ORAL
  Filled 2024-10-13: qty 1

## 2024-10-13 NOTE — Assessment & Plan Note (Addendum)
 Nursing noted malodorous urine and signs of pt discomfort voiding.  UA showed moderate leukocytes.  Pt afebrile. --Start empiric Macrobid 100 mg BID x 5 days --Urine culture results are pending - follow

## 2024-10-13 NOTE — TOC Transition Note (Signed)
 Transition of Care [facility]) - Discharge Note   Patient Details  Name: Doris Wyatt MRN: [id number] Date of Birth: 03/31/1946  Transition of Care [facility]) CM/SW Contact:  Alfonse JONELLE Rex, RN Phone Number: 10/13/2024, 12:09 PM   Clinical Narrative:   Level 2 PASRR [id number] E obtained. DC to [facility]. [facility] for transport. Patient's dtr, Doris Wyatt notified. No further INPT CM needs identified at this time.        Final next level of care: Skilled Nursing Facility Barriers to Discharge: Barriers Resolved   Patient Goals and CMS Choice Patient states their goals for this hospitalization and ongoing recovery are:: return home CMS Medicare.gov Compare Post Acute Care list provided to:: Patient Represenative (must comment) Doris Wyatt, Doris Wyatt (Son)  [phone] (Mobile)) Choice offered to / list presented to : Adult Children [place] ownership interest in [facility].provided to:: Adult Children    Discharge Placement PASRR number recieved: 10/13/24 Existing PASRR number confirmed : 10/13/24          Patient chooses bed at: [facility])   Name of family member notified: daughter Patient and family notified of of transfer: 10/13/24  Discharge Plan and Services Additional resources added to the After Visit Summary for                              [facility] Agency: [place] [facility] [place]        Social Drivers of Health (SDOH) Interventions SDOH Screenings   Food Insecurity: Patient Unable To Answer (09/28/2024)  Housing: Unknown (09/28/2024)  Transportation Needs: Patient Unable To Answer (09/28/2024)  Utilities: Patient Unable To Answer (09/28/2024)  Depression (PHQ2-9): Low Risk  (11/16/2019)  Social Connections: Unknown (09/28/2024)  Tobacco Use: Medium Risk (09/28/2024)     Readmission Risk Interventions    09/29/2024    1:12 PM 09/09/2024   12:13 PM  Readmission Risk Prevention Plan  Post Dischage Appt  Complete   Medication Screening  Complete  Transportation Screening Complete Complete  PCP or Specialist Appt within 5-7 Days Complete   Home Care Screening Complete   Medication Review (RN CM) Complete

## 2024-10-13 NOTE — Progress Notes (Signed)
 PT Cancellation Note  Patient Details Name: Doris Wyatt MRN: [id number] DOB: 22-Nov-1946   Cancelled Treatment:    Reason Eval/Treat Not Completed: Other (comment)  Pt discharging to SNF, [facility] has been called. Benjiman, PT Acute Rehab [facility] [phone]  Benjiman VEAR [place] 10/13/2024, 10:19 AM

## 2024-10-15 LAB — URINE CULTURE: Culture: >=100000 — AB

## 2024-10-23 ENCOUNTER — Emergency Department (HOSPITAL_COMMUNITY)

## 2024-10-23 ENCOUNTER — Emergency Department (HOSPITAL_COMMUNITY)
Admission: EM | Admit: 2024-10-23 | Discharge: 2024-10-23 | Disposition: A | Discharge status: Home / Self Care | Attending: Emergency Medicine | Admitting: Emergency Medicine

## 2024-10-23 DIAGNOSIS — M542 Cervicalgia: Secondary | ICD-10-CM | POA: Diagnosis not present

## 2024-10-23 DIAGNOSIS — E119 Type 2 diabetes mellitus without complications: Secondary | ICD-10-CM | POA: Diagnosis not present

## 2024-10-23 DIAGNOSIS — W19XXXA Unspecified fall, initial encounter: Secondary | ICD-10-CM | POA: Insufficient documentation

## 2024-10-23 DIAGNOSIS — Z7982 Long term (current) use of aspirin: Secondary | ICD-10-CM | POA: Diagnosis not present

## 2024-10-23 DIAGNOSIS — S0990XA Unspecified injury of head, initial encounter: Secondary | ICD-10-CM | POA: Diagnosis not present

## 2024-10-23 DIAGNOSIS — M47812 Spondylosis without myelopathy or radiculopathy, cervical region: Secondary | ICD-10-CM | POA: Diagnosis not present

## 2024-10-23 DIAGNOSIS — Z043 Encounter for examination and observation following other accident: Secondary | ICD-10-CM | POA: Diagnosis not present

## 2024-10-23 DIAGNOSIS — I10 Essential (primary) hypertension: Secondary | ICD-10-CM | POA: Insufficient documentation

## 2024-10-23 DIAGNOSIS — N390 Urinary tract infection, site not specified: Secondary | ICD-10-CM | POA: Diagnosis not present

## 2024-10-23 DIAGNOSIS — M25531 Pain in right wrist: Secondary | ICD-10-CM | POA: Diagnosis present

## 2024-10-23 DIAGNOSIS — Z794 Long term (current) use of insulin: Secondary | ICD-10-CM | POA: Diagnosis not present

## 2024-10-23 DIAGNOSIS — M546 Pain in thoracic spine: Secondary | ICD-10-CM | POA: Insufficient documentation

## 2024-10-23 DIAGNOSIS — M545 Low back pain, unspecified: Secondary | ICD-10-CM | POA: Insufficient documentation

## 2024-10-23 DIAGNOSIS — M47816 Spondylosis without myelopathy or radiculopathy, lumbar region: Secondary | ICD-10-CM | POA: Diagnosis not present

## 2024-10-23 DIAGNOSIS — G309 Alzheimer's disease, unspecified: Secondary | ICD-10-CM | POA: Diagnosis not present

## 2024-10-23 DIAGNOSIS — J449 Chronic obstructive pulmonary disease, unspecified: Secondary | ICD-10-CM | POA: Insufficient documentation

## 2024-10-23 DIAGNOSIS — I6782 Cerebral ischemia: Secondary | ICD-10-CM | POA: Diagnosis not present

## 2024-10-23 DIAGNOSIS — S299XXA Unspecified injury of thorax, initial encounter: Secondary | ICD-10-CM | POA: Diagnosis not present

## 2024-10-23 DIAGNOSIS — M19032 Primary osteoarthritis, left wrist: Secondary | ICD-10-CM | POA: Diagnosis not present

## 2024-10-23 DIAGNOSIS — M1612 Unilateral primary osteoarthritis, left hip: Secondary | ICD-10-CM | POA: Diagnosis not present

## 2024-10-23 DIAGNOSIS — Z79899 Other long term (current) drug therapy: Secondary | ICD-10-CM | POA: Diagnosis not present

## 2024-10-23 DIAGNOSIS — I700 Atherosclerosis of aorta: Secondary | ICD-10-CM | POA: Diagnosis not present

## 2024-10-23 DIAGNOSIS — F0280 Dementia in other diseases classified elsewhere without behavioral disturbance: Secondary | ICD-10-CM | POA: Insufficient documentation

## 2024-10-23 DIAGNOSIS — S3992XA Unspecified injury of lower back, initial encounter: Secondary | ICD-10-CM | POA: Diagnosis not present

## 2024-10-23 LAB — COMPREHENSIVE METABOLIC PANEL WITH GFR
ALT: 15 U/L (ref 0–44)
AST: 24 U/L (ref 15–41)
Albumin: 3.1 g/dL — ABNORMAL LOW (ref 3.5–5.0)
Alkaline Phosphatase: 91 U/L (ref 38–126)
Anion gap: 9 (ref 5–15)
BUN: 11 mg/dL (ref 8–23)
CO2: 28 mmol/L (ref 22–32)
Calcium: 8.8 mg/dL — ABNORMAL LOW (ref 8.9–10.3)
Chloride: 103 mmol/L (ref 98–111)
Creatinine, Ser: 0.5 mg/dL (ref 0.44–1.00)
GFR, Estimated: >60 mL/min (ref >60)
Glucose, Bld: 271 mg/dL — ABNORMAL HIGH (ref 70–99)
Potassium: 4.2 mmol/L (ref 3.5–5.1)
Sodium: 139 mmol/L (ref 135–145)
Total Bilirubin: 0.3 mg/dL (ref 0.0–1.2)
Total Protein: 6.1 g/dL — ABNORMAL LOW (ref 6.5–8.1)

## 2024-10-23 LAB — URINALYSIS, W/ REFLEX TO CULTURE (INFECTION SUSPECTED)
Bilirubin Urine: NEGATIVE
Glucose, UA: >=500 mg/dL — AB
Hgb urine dipstick: NEGATIVE
Ketones, ur: NEGATIVE mg/dL
Nitrite: POSITIVE — AB
Protein, ur: NEGATIVE mg/dL
Specific Gravity, Urine: 1.018 (ref 1.005–1.030)
WBC, UA: >50 WBC/hpf (ref 0–5)
pH: 5 (ref 5.0–8.0)

## 2024-10-23 LAB — CBC WITH DIFFERENTIAL/PLATELET
Abs Immature Granulocytes: 0.04 K/uL (ref 0.00–0.07)
Basophils Absolute: 0.1 K/uL (ref 0.0–0.1)
Basophils Relative: 1 %
Eosinophils Absolute: 0.4 K/uL (ref 0.0–0.5)
Eosinophils Relative: 5 %
HCT: 35.1 % — ABNORMAL LOW (ref 36.0–46.0)
Hemoglobin: 11 g/dL — ABNORMAL LOW (ref 12.0–15.0)
Immature Granulocytes: 1 %
Lymphocytes Relative: 11 %
Lymphs Abs: 0.9 K/uL (ref 0.7–4.0)
MCH: 30.4 pg (ref 26.0–34.0)
MCHC: 31.3 g/dL (ref 30.0–36.0)
MCV: 97 fL (ref 80.0–100.0)
Monocytes Absolute: 0.9 K/uL (ref 0.1–1.0)
Monocytes Relative: 12 %
Neutro Abs: 5.7 K/uL (ref 1.7–7.7)
Neutrophils Relative %: 70 %
Platelets: 321 K/uL (ref 150–400)
RBC: 3.62 MIL/uL — ABNORMAL LOW (ref 3.87–5.11)
RDW: 14.3 % (ref 11.5–15.5)
WBC: 8.1 K/uL (ref 4.0–10.5)
nRBC: 0 % (ref 0.0–0.2)

## 2024-10-23 MED ORDER — CEPHALEXIN 500 MG PO CAPS
500.0000 mg | ORAL_CAPSULE | Freq: Once | ORAL | Status: AC
  Administered 2024-10-23: 500 mg via ORAL
  Filled 2024-10-23: qty 1

## 2024-10-23 MED ORDER — CEPHALEXIN 500 MG PO CAPS: 500.0000 mg | ORAL_CAPSULE | Freq: Three times a day (TID) | ORAL | 0 refills | Status: AC

## 2024-10-23 MED ORDER — ACETAMINOPHEN 325 MG PO TABS
650.0000 mg | ORAL_TABLET | Freq: Once | ORAL | Status: AC
  Administered 2024-10-23: 650 mg via ORAL
  Filled 2024-10-23: qty 2

## 2024-10-23 NOTE — ED Notes (Signed)
 Removed neck brace per Dr. Dreama. Pt tolerated without discomfort.

## 2024-10-23 NOTE — ED Triage Notes (Addendum)
 BIB EMS patient coming from [facility], found on floor landing on cement, patient reports back/left wrist pain, reports she did hit her head, patient is poor historian with dementia, patient's daughter PERRLA, BG 335. Patient recently broke left femur and was in PT at [place]. Patient is alert and oriented x 2 which is baseline for her per daughter. Airway patent, respirations even and unlabored. Skin normal, warm and dry. patient has no complaints at this time. Siderails up x 2. Call light at bedside. Patient had c collar placed en route. Denies SOB/CP/n/v/fever/dizziness.

## 2024-10-23 NOTE — ED Provider Notes (Signed)
 [place] EMERGENCY DEPARTMENT AT [facility] Provider Note   CSN: [id number] Arrival date & time: 10/23/24  8361     Patient presents with: [place]   Doris Wyatt is a 78 y.o. female.  {Add pertinent medical, surgical, social history, OB history to HPI:32947} HPI      78 year old female with a history of COPD, anxiety/depression, hypertension, hyperlipidemia, Alzheimer's dementia, type 2 diabetes, fall with hip fracture and hospitalization from October 9 to 16 after which she was discharged to a skilled nursing facility, fall October 30 with left femoral shaft fracture and underwent ORIF and nail insertion October 30 who presents with fall.     Past Medical History:  Diagnosis Date   Abdominal pain, acute, left lower quadrant 10/31/2017   Arthritis    COPD (chronic obstructive pulmonary disease)    Diverticulitis of large intestine with perforation without abscess or bleeding    Generalized anxiety disorder    History of kidney stones    Hypercholesterolemia    Hypertension    Major depressive disorder    Major neurocognitive disorder due to Alzheimer's disease 07/01/2021   Osteoporosis    Pneumonia    hx of    Type II diabetes mellitus    Ureteral stone with hydronephrosis 10/31/2017   Urolithiasis      Prior to Admission medications   Medication Sig Start Date End Date Taking? Authorizing Provider  acetaminophen  (TYLENOL ) 325 MG tablet Take 2 tablets (650 mg total) by mouth every 6 (six) hours as needed for mild pain (or Fever >/= 101). Patient taking differently: Take 650 mg by mouth 3 (three) times daily. 11/04/17   Dolan Mateo Larger, MD  albuterol  (VENTOLIN  HFA) 108 [phone] Base) MCG/ACT inhaler Inhale 2 puffs into the lungs every 6 (six) hours as needed for wheezing or shortness of breath.    [provider]  amLODipine  (NORVASC ) 2.5 MG tablet Take 1 tablet (2.5 mg total) by mouth daily. 10/13/24   Fausto Burnard LABOR, DO  aspirin  EC 81 MG  tablet Take 81 mg by mouth at bedtime. Swallow whole.    [provider]  atorvastatin  (LIPITOR) 40 MG tablet Take 40 mg by mouth at bedtime.    [provider]  bisacodyl  (DULCOLAX) 5 MG EC tablet Take 5 mg by mouth in the morning.    [provider]  dicyclomine (BENTYL) 20 MG tablet Take 20 mg by mouth 3 (three) times daily before meals.    [provider]  divalproex  (DEPAKOTE ) 125 MG DR tablet Take 1 tablet (125 mg total) by mouth 2 (two) times daily. 06/14/24   Wertman, Sara E, PA-C  docusate sodium  (COLACE) 100 MG capsule Take 1 capsule (100 mg total) by mouth 2 (two) times daily as needed for mild constipation. 10/12/24   Fausto Burnard LABOR, DO  enoxaparin  (LOVENOX ) 30 MG/0.3ML injection Inject 0.3 mLs (30 mg total) into the skin daily. 09/13/24 10/13/24  Odell Celinda Balo, MD  HUMALOG 100 UNIT/ML injection Inject 0-12 Units into the skin See admin instructions. Inject 0-12 units into the skin three times a day before meals and at bedtime, per sliding scale: BGL less than 70 = CALL NP or PA; 70-200 = give nothing; 201-250 = 2 units; 251-300 = 4 units; 301-350 = 6 units; 351-400 = 8 units; 401-450 = 10 units; 451-600 = 12 units AND re-check in 2 hours. If  >350 or <100 at that time, notify the provider.    [provider]  insulin  aspart (NOVOLOG ) 100 UNIT/ML injection Inject 0-9 Units into the skin 3 (three) times daily with meals. Take 0-9 units 3 times daily with meals as follows: If CBG 70-120: 0 units If CBG 121-150: 1 unit If CBG 151-200: 2 units If CBG 201-250: 3 units If CBG 251-300: 5 units If CBG 301-350: 7 units If CBG 351-400: 9 units If CBG over 400: 12 units and call MD 10/12/24   Fausto Burnard LABOR, DO  LANTUS  SOLOSTAR 100 UNIT/ML Solostar Pen Inject 16 Units into the skin in the morning. 10/12/24   Fausto Burnard A, DO  lidocaine  4 % Place 1 patch onto the skin See admin instructions. Apply 1 patch to the left hip in the morning  at remove at bedtime    [provider]  memantine  (NAMENDA ) 10 MG tablet TAKE 1 TABLET BY MOUTH TWICE A DAY 06/16/24   Wertman, Sara E, PA-C  Multiple Vitamin (MULTIVITAMIN WITH MINERALS) TABS tablet Take 1 tablet by mouth daily. 10/12/24   Fausto Burnard LABOR, DO  nitrofurantoin , macrocrystal-monohydrate, (MACROBID ) 100 MG capsule Take 1 capsule (100 mg total) by mouth every 12 (twelve) hours. 10/13/24   Fausto Burnard LABOR, DO  Nutritional Supplements (GLUCERNA PO) Take 1 Can by mouth See admin instructions. Glucerna LACTOSE-FREE - Drink 1 can by mouth two times a day between meals    [provider]  pantoprazole  (PROTONIX ) 40 MG tablet Take 1 tablet (40 mg total) by mouth daily. 10/12/24   Fausto Burnard A, DO  polyethylene glycol (MIRALAX  / GLYCOLAX ) 17 g packet Take 17 g by mouth daily as needed. 10/12/24   Fausto Burnard LABOR, DO  valsartan  (DIOVAN ) 80 MG tablet Take 80 mg by mouth daily.    [provider]    Allergies: Patient has no known allergies.    Review of Systems  Updated Vital Signs BP (!) 156/70 (BP Location: Right Arm)   Pulse 90   Temp 98.3 F (36.8 C) (Oral)   Resp 16   SpO2 98%   Physical Exam  (all labs ordered are listed, but only abnormal results are displayed) Labs Reviewed - No data to display  EKG: None  Radiology: No results found.  {Document cardiac monitor, telemetry assessment procedure when appropriate:32947} Procedures   Medications Ordered in the ED - No data to display    {Click here for ABCD2, HEART and other calculators REFRESH Note before signing:1}                              Medical Decision Making  ***  {Document critical care time when appropriate  Document review of labs and clinical decision tools ie CHADS2VASC2, etc  Document your independent review of radiology images and any outside records  Document your discussion with family members, caretakers and with consultants  Document social determinants  of health affecting pt's care  Document your decision making why or why not admission, treatments were needed:32947:::1}   Final diagnoses:  None    ED Discharge Orders     None

## 2024-10-24 DIAGNOSIS — W19XXXA Unspecified fall, initial encounter: Secondary | ICD-10-CM | POA: Diagnosis not present

## 2024-10-24 DIAGNOSIS — N390 Urinary tract infection, site not specified: Secondary | ICD-10-CM | POA: Diagnosis not present

## 2024-10-24 LAB — I-STAT CHEM 8, ED
BUN: 12 mg/dL (ref 8–23)
Calcium, Ion: 1.11 mmol/L — ABNORMAL LOW (ref 1.15–1.40)
Chloride: 101 mmol/L (ref 98–111)
Creatinine, Ser: 0.4 mg/dL — ABNORMAL LOW (ref 0.44–1.00)
Glucose, Bld: 229 mg/dL — ABNORMAL HIGH (ref 70–99)
HCT: 31 % — ABNORMAL LOW (ref 36.0–46.0)
Hemoglobin: 10.5 g/dL — ABNORMAL LOW (ref 12.0–15.0)
Potassium: 4 mmol/L (ref 3.5–5.1)
Sodium: 139 mmol/L (ref 135–145)
TCO2: 28 mmol/L (ref 22–32)

## 2024-10-24 NOTE — Progress Notes (Signed)
 Patient's fracture was likely related to severe osteoporosis and therefore pathologic

## 2024-10-24 NOTE — Progress Notes (Signed)
 CSW received phone call from Madelin Jacobus with [facility] facilities inquiring about pt. She reports notes state pt was discharged; however, pt is not at [facility] currently. CSW informed pt is not currently at the hospital. Tammy reported she will call pt's daughter.   Per chart review, pt discharged around 10:22PM on 10/23/24.

## 2024-10-25 LAB — URINE CULTURE: Culture: >=100000 — AB

## 2024-10-26 ENCOUNTER — Telehealth (HOSPITAL_BASED_OUTPATIENT_CLINIC_OR_DEPARTMENT_OTHER): Payer: Self-pay | Admitting: *Deleted

## 2024-10-26 NOTE — Telephone Encounter (Signed)
 Post ED Visit - Positive Culture Follow-up  Culture report reviewed by antimicrobial stewardship pharmacist: [facility] [x]  Camelia Marina, [place].D. []  Venetia Gully, Pharm.D., BCPS AQ-ID []  Garrel Crews, Pharm.D., BCPS []  Almarie Lunger, Pharm.D., BCPS []  [place], [address].D., BCPS, AAHIVP []  Rosaline Bihari, Pharm.D., BCPS, AAHIVP []  Vernell Meier, PharmD, BCPS []  Latanya Hint, PharmD, BCPS []  Donald Medley, PharmD, BCPS []  Rocky Bold, PharmD []  Dorothyann Alert, PharmD, BCPS []  Morene Babe, PharmD  [facility] []  Rosaline Edison, PharmD []  Romona Bliss, PharmD []  Dolphus Roller, PharmD []  Veva Seip, Rph []  Vernell Daunt) Leonce, PharmD []  Eva Allis, PharmD []  Rosaline Millet, PharmD []  Iantha Batch, PharmD []  Arvin Gauss, PharmD []  Wanda Hasting, PharmD []  Ronal Rav, PharmD []  Rocky Slade, PharmD []  Bard Jeans, PharmD   Positive urine culture Treated with Cephalexin , organism sensitive to the same and no further patient follow-up is required at this time.  Jama Lisle Blondie 10/26/2024, 8:09 AM

## 2024-11-01 DIAGNOSIS — G309 Alzheimer's disease, unspecified: Secondary | ICD-10-CM | POA: Diagnosis not present

## 2024-11-01 DIAGNOSIS — S72302D Unspecified fracture of shaft of left femur, subsequent encounter for closed fracture with routine healing: Secondary | ICD-10-CM | POA: Diagnosis not present

## 2024-11-01 DIAGNOSIS — E1129 Type 2 diabetes mellitus with other diabetic kidney complication: Secondary | ICD-10-CM | POA: Diagnosis not present

## 2024-11-01 DIAGNOSIS — E43 Unspecified severe protein-calorie malnutrition: Secondary | ICD-10-CM | POA: Diagnosis not present

## 2024-11-01 DIAGNOSIS — Z4802 Encounter for removal of sutures: Secondary | ICD-10-CM | POA: Diagnosis not present

## 2024-11-01 DIAGNOSIS — E1165 Type 2 diabetes mellitus with hyperglycemia: Secondary | ICD-10-CM | POA: Diagnosis not present

## 2024-11-01 DIAGNOSIS — J449 Chronic obstructive pulmonary disease, unspecified: Secondary | ICD-10-CM | POA: Diagnosis not present

## 2024-11-01 DIAGNOSIS — E1169 Type 2 diabetes mellitus with other specified complication: Secondary | ICD-10-CM | POA: Diagnosis not present

## 2024-11-02 ENCOUNTER — Telehealth: Payer: Self-pay | Admitting: Physician Assistant

## 2024-11-02 ENCOUNTER — Other Ambulatory Visit: Payer: Self-pay

## 2024-11-02 MED ORDER — DIVALPROEX SODIUM 125 MG PO DR TAB: 125.0000 mg | DELAYED_RELEASE_TABLET | Freq: Two times a day (BID) | ORAL | 31 refills | Status: AC

## 2024-11-02 NOTE — Telephone Encounter (Signed)
 Sent to [facility] to sign.

## 2024-11-02 NOTE — Telephone Encounter (Signed)
 Patient is out of medication and needs a refilled [facility] into the [facility] [address]   Divaproex 125 mg

## 2024-11-03 ENCOUNTER — Other Ambulatory Visit: Payer: Self-pay

## 2024-11-03 ENCOUNTER — Encounter: Payer: Self-pay | Admitting: Physician Assistant

## 2024-11-03 ENCOUNTER — Telehealth: Payer: Self-pay | Admitting: Physician Assistant

## 2024-11-03 ENCOUNTER — Ambulatory Visit: Admitting: Physician Assistant

## 2024-11-03 MED ORDER — MEMANTINE HCL 10 MG PO TABS: 10.0000 mg | ORAL_TABLET | Freq: Two times a day (BID) | ORAL | 0 refills | Status: AC

## 2024-11-03 NOTE — Telephone Encounter (Signed)
 Sent refill to [facility] on [address]

## 2024-11-03 NOTE — Telephone Encounter (Signed)
 Pt son has a flat so he is unable to bring her. She has an appt sch for next avail.   [facility]

## 2024-11-03 NOTE — Progress Notes (Incomplete)
 Assessment & Plan Dementia due to Alzheimer's disease   Doris Wyatt is a very pleasant 77 y.o. RH female with a history of hypertension, hyperlipidemia, DM2, history of head injury 2016, COPD/asthma anxiety, anxiety, depression and a history of dementia likely due to Alzheimer's disease with behavioral disturbance seen today in follow up for memory loss. Patient is currently on donepezil  23 mg daily and memantine  10 mg twice daily, and on Depakote  125 mg twice daily for mood***.  This patient is accompanied in the office by her daughter*** who supplements the history.  Previous records as well as any outside records available were reviewed prior to todays visit. Patient was last seen on 08/05/2023 via video visit***. Memory decline is noted***.  Mood is controlled but she requires 24/7 surveillance.  MMSE today is  /30.Doris Wyatt Patient is able to participate on ADLs and continues to drive without difficulties. Mood is***     Continue donepezil  23 mg daily and memantine  10 mg twice daily, side effects discussed Continue Depakote  125 mg twice daily for mood, side effects discussed Continue to control other mood medications as per PCP Recommend good control of her cardiovascular risk factors   Discussed the use of AI scribe software for clinical note transcription with the patient, who gave verbal consent to proceed.  History of Present Illness  fall with hip fracture and hospitalization from October 9 to 16 after which she was discharged to a skilled nursing facility, fall October 30 with left femoral shaft fracture and underwent ORIF and nail insertion October 30 who presents with fall.  She had a fall on October 30 with left femoral shaft fracture and underwent ORIF and nail insertion   She lives at [facility] - PT uses a walker to ambulate  She had a recent UTI November 2025  Any changes in memory since last visit?  Reports significant changes especially when recognizing members of her  own family, and their names.  She is in complete denial of any memory loss, stating that she is feeling great.  She may forget at times the names of objects and calls everything telephone refers to someone as the woman  repeats oneself? Endorsed Disoriented when walking into a room?  Not frequently. Leaving objects in unusual places?  Not in unusual places Ambulates with difficulty?  She is less coordinated than before. Recent falls?  Denies Any head injuries?  Denies History of seizures?  Denies Wandering behavior?  Denies Any mood changes?  She has become more argumentative than prior.  She feels that everybody is against her, and that going to doctors offices is not an option, because they are trying to take my money  Any worsening depression?  Denies Hallucinations?  Denies Paranoia?  No frank paranoia, but feels that everybody is against her. Patient reports that sleeps well without vivid dreams, REM behavior or sleepwalking   Any hygiene concerns? Independent of bathing and dressing? Does the patient needs help with medications?  Son is in charge.   Any changes in appetite?  Has decreased appetite, eating a few bites at the time.  If the food is not prepared for her, she would not initiate preparing anything.  Daughter reports that she has been losing some weight. Patient have trouble swallowing?  Denies Any headaches?  Denies  double vision?  Denies Any focal numbness or tingling?  Denies Any pain?  Some arthritic pain. Unilateral weakness?  Denies Any tremors?  Denies Any incontinence of urine?  Endorsed, her  daughter reports that she has been waiting accidentally results Any bowel dysfunction?  She has bouts of diarrhea although is unclear if it is attributed to bowel incontinence.   Priorly reviewed MRI of the brain 08/07/2021 was remarkable for moderate chronic small vessel ischemic  changes of the cerebral hemispheric white matter.  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  No acute changes were seen.     Initial evaluation 07/21/2021 the patient is seen in neurologic consultation at the request of Doris Wyatt, Doris Wyatt SAUNDERS, MD for the evaluation of memory.  The patient is accompanied by her daughter  who supplements the history. She is a 78 y.o. year old female who has had memory issues for about 4 years, when she began retelling stories, and asking the same questions.  In Thanksgiving 2020, the patient was repetitively asking for cornstarch to her daughter, who at that point began to be worried that there was something more to it .  About 1 year ago, the patient began to forget to take her medications, or remember which medication she was taking.  Her daughter has to have someone coming in monitoring this meds.  She initially felt that memory loss was related to anxiety and nervousness with testing, and that she was  never good spelling or math, having not had finished high school. The patient lives alone, and her children monitor her on a regular basis, as they live nearby.  She likes coloring books, and she dislikes crossword puzzles.  She likes to watch TV and socialize frequently.  Her mood has been always anxious, but there is no apparent depression.  She has been irritable at times especially when she cannot remember.  She reports sleeping well, but recently, she is not sure that she was having vivid dreams, or hallucinations.  She was on Seroquel, and she attributes these changes to the medicine, which are now less frequent, and these hallucinations have resolved.  They were described as frames coming off of the wall, last episode about 1 week ago .  She is also positive of her pocketbook, but denies paranoia, although her daughter is not that sure , most recently, she accused her of hiding $100 from the picture frames.  She is independent of bathing and dressing, that he had she has been showing  hoarding behavior.  Her appetite is good, but has become more picky about the choices of food.  She denies trouble swallowing.  She cooks and denies leaving the stove on.  Ambulates without difficulty without walker or cane.  She had a recent mechanical fall due to being tangled with a dog leash.  No fractures or head injury.  She has not been driving, as she lost interest in doing so.  She denies any headaches, double vision, dizziness, focal numbness or tingling, unilateral weakness or tremors.  She denies incontinence or retention, or constipation.  She likes to drink excessive amount of coffee, which may contribute to some diarrhea.  She denies anosmia.  No history of OSA, alcohol.  She quit in 1991 the use of 3 pack a day of tobacco.  Family history for maternal grandmother with dementia.      Neurocognitive Test 07/09/21 Dr. Richie  The results of cognitive testing is dementia due to Alzheimer's disease. Ms. Hanaway had notable trouble learning new information, even with additional learning trials, and exhibited a flat learning curve across assessments. She was also fully amnestic (  i.e., retention percentages of 0%) across all administered memory tasks, suggesting an ongoing and notable impairment storing newly learned information. This pattern of memory dysfunction is the hallmark characteristic of this condition. Additional dysfunction surrounding confrontation naming, semantic fluency, and visuospatial abilities are further consistent with Alzheimer's disease. Taken together, she does exhibit a fairly classic pattern.    02/02/2023    6:00 PM 08/05/2022    7:00 AM  MMSE - Mini Mental State Exam  Orientation to time 0 1  Orientation to Place 3 5  Registration 3 3  Attention/ Calculation 5 4  Recall 0 0  Language- name 2 objects 2 2  Language- repeat 0 1  Language- follow 3 step command 2 3  Language- read & follow direction 1 1  Write a sentence 0 1  Copy design 0 1  Total score 16 22        No data to display          Results     Objective:    Neurological Exam:    VITALS:  There were no vitals filed for this visit.  GEN:  The patient appears stated age and is in NAD. HEENT:  Normocephalic, atraumatic.   Neurological examination:  General: NAD, well-groomed, appears stated age. Orientation: The patient is alert. Oriented to person, place and date Cranial nerves: There is good facial symmetry.The speech is fluent and clear. No aphasia or dysarthria. Fund of knowledge is appropriate. Recent and remote memory are impaired. Attention and concentration are reduced. Able to name objects and repeat phrases.  Hearing is intact to conversational tone. *** Sensation: Sensation is intact to light touch throughout Motor: Strength is at least antigravity x4. DTR's 2/4 in UE/LE     Movement examination:  Tone: There is normal tone in the UE/LE Abnormal movements:  no tremor.  No myoclonus.  No asterixis.   Coordination:  There is no decremation with RAM's. Normal finger to nose  Gait and Station: The patient has no*** difficulty arising out of a deep-seated chair without the use of the hands. The patient's stride length is good.  Gait is cautious and narrow.    Thank you for allowing us  the opportunity to participate in the care of this nice patient. Please do not hesitate to contact us  for any questions or concerns.   Total time spent on today's visit was *** minutes dedicated to this patient today, preparing to see patient, examining the patient, ordering tests and/or medications and counseling the patient, documenting clinical information in the EHR or other health record, independently interpreting results and communicating results to the patient/family, discussing treatment and goals, answering patient's questions and coordinating care.  Cc:  Madden, Evan J, MD  Camie Sevin 11/03/2024 5:46 AM

## 2024-12-30 NOTE — Progress Notes (Signed)
"  °  Unable to proceed with the visit due to technical difficulties, the patient could not access the video visit.  Will reschedule "

## 2025-01-01 ENCOUNTER — Telehealth: Admitting: Physician Assistant

## 2025-01-01 DIAGNOSIS — F0280 Dementia in other diseases classified elsewhere without behavioral disturbance: Secondary | ICD-10-CM

## 2025-01-01 DIAGNOSIS — G309 Alzheimer's disease, unspecified: Secondary | ICD-10-CM

## 2025-02-02 ENCOUNTER — Ambulatory Visit: Payer: Self-pay | Admitting: Physician Assistant
